# Patient Record
Sex: Female | Born: 1945 | Hispanic: Refuse to answer | Marital: Married
Health system: Midwestern US, Community
[De-identification: ages and names within clinical notes are randomized; demographics above are authoritative.]

## PROBLEM LIST (undated history)

## (undated) DIAGNOSIS — C801 Malignant (primary) neoplasm, unspecified: Secondary | ICD-10-CM

## (undated) DIAGNOSIS — F329 Major depressive disorder, single episode, unspecified: Secondary | ICD-10-CM

## (undated) DIAGNOSIS — E039 Hypothyroidism, unspecified: Secondary | ICD-10-CM

## (undated) DIAGNOSIS — I10 Essential (primary) hypertension: Secondary | ICD-10-CM

## (undated) DIAGNOSIS — R519 Headache, unspecified: Secondary | ICD-10-CM

## (undated) DIAGNOSIS — E785 Hyperlipidemia, unspecified: Secondary | ICD-10-CM

## (undated) DIAGNOSIS — M8580 Other specified disorders of bone density and structure, unspecified site: Secondary | ICD-10-CM

## (undated) DIAGNOSIS — R51 Headache: Secondary | ICD-10-CM

## (undated) DIAGNOSIS — N200 Calculus of kidney: Secondary | ICD-10-CM

## (undated) DIAGNOSIS — D249 Benign neoplasm of unspecified breast: Secondary | ICD-10-CM

## (undated) DIAGNOSIS — F419 Anxiety disorder, unspecified: Secondary | ICD-10-CM

## (undated) DIAGNOSIS — E119 Type 2 diabetes mellitus without complications: Secondary | ICD-10-CM

## (undated) HISTORY — DX: Benign neoplasm of unspecified breast: D24.9

## (undated) HISTORY — PX: POLYPECTOMY: SHX149

## (undated) HISTORY — DX: Hypothyroidism, unspecified: E03.9

## (undated) HISTORY — DX: Headache, unspecified: R51.9

## (undated) HISTORY — DX: Hyperlipidemia, unspecified: E78.5

## (undated) HISTORY — PX: TONSILLECTOMY: SUR1361

## (undated) HISTORY — DX: Essential (primary) hypertension: I10

## (undated) HISTORY — PX: BREAST SURGERY: SHX581

## (undated) HISTORY — DX: Malignant (primary) neoplasm, unspecified: C80.1

## (undated) HISTORY — DX: Calculus of kidney: N20.0

## (undated) HISTORY — PX: COLONOSCOPY: SHX174

## (undated) HISTORY — DX: Major depressive disorder, single episode, unspecified: F32.9

## (undated) HISTORY — DX: Headache: R51

## (undated) HISTORY — DX: Other specified disorders of bone density and structure, unspecified site: M85.80

---

## 1976-09-03 HISTORY — PX: OTHER SURGICAL HISTORY: SHX169

## 1986-09-03 HISTORY — PX: HYSTEROSCOPY: SHX211

## 1998-11-29 ENCOUNTER — Other Ambulatory Visit: Admission: RE | Admit: 1998-11-29 | Discharge: 1998-11-29 | Payer: Self-pay | Admitting: Obstetrics and Gynecology

## 2000-11-25 ENCOUNTER — Other Ambulatory Visit: Admission: RE | Admit: 2000-11-25 | Discharge: 2000-11-25 | Payer: Self-pay | Admitting: Obstetrics and Gynecology

## 2001-12-02 ENCOUNTER — Other Ambulatory Visit: Admission: RE | Admit: 2001-12-02 | Discharge: 2001-12-02 | Payer: Self-pay | Admitting: Obstetrics and Gynecology

## 2002-09-17 ENCOUNTER — Ambulatory Visit (HOSPITAL_BASED_OUTPATIENT_CLINIC_OR_DEPARTMENT_OTHER): Admission: RE | Admit: 2002-09-17 | Discharge: 2002-09-17 | Payer: Self-pay | Admitting: Obstetrics and Gynecology

## 2002-09-17 ENCOUNTER — Encounter (INDEPENDENT_AMBULATORY_CARE_PROVIDER_SITE_OTHER): Payer: Self-pay | Admitting: Specialist

## 2003-01-07 ENCOUNTER — Other Ambulatory Visit: Admission: RE | Admit: 2003-01-07 | Discharge: 2003-01-07 | Payer: Self-pay | Admitting: Obstetrics and Gynecology

## 2003-12-16 ENCOUNTER — Encounter: Payer: Self-pay | Admitting: Internal Medicine

## 2003-12-23 ENCOUNTER — Encounter: Payer: Self-pay | Admitting: Internal Medicine

## 2004-01-10 ENCOUNTER — Other Ambulatory Visit: Admission: RE | Admit: 2004-01-10 | Discharge: 2004-01-10 | Payer: Self-pay | Admitting: Obstetrics and Gynecology

## 2004-07-19 ENCOUNTER — Ambulatory Visit: Payer: Self-pay | Admitting: Internal Medicine

## 2004-11-16 ENCOUNTER — Ambulatory Visit: Payer: Self-pay | Admitting: Internal Medicine

## 2004-11-23 ENCOUNTER — Ambulatory Visit: Payer: Self-pay | Admitting: Internal Medicine

## 2004-12-29 ENCOUNTER — Ambulatory Visit: Payer: Self-pay | Admitting: Internal Medicine

## 2005-01-10 ENCOUNTER — Other Ambulatory Visit: Admission: RE | Admit: 2005-01-10 | Discharge: 2005-01-10 | Payer: Self-pay | Admitting: Obstetrics and Gynecology

## 2005-01-12 ENCOUNTER — Ambulatory Visit: Payer: Self-pay | Admitting: Internal Medicine

## 2005-03-14 ENCOUNTER — Ambulatory Visit: Payer: Self-pay | Admitting: Internal Medicine

## 2005-03-21 ENCOUNTER — Ambulatory Visit: Payer: Self-pay | Admitting: Internal Medicine

## 2005-06-15 ENCOUNTER — Ambulatory Visit: Payer: Self-pay | Admitting: Internal Medicine

## 2005-06-20 ENCOUNTER — Ambulatory Visit: Payer: Self-pay | Admitting: Internal Medicine

## 2005-12-12 ENCOUNTER — Ambulatory Visit: Payer: Self-pay | Admitting: Internal Medicine

## 2005-12-19 ENCOUNTER — Ambulatory Visit: Payer: Self-pay | Admitting: Internal Medicine

## 2006-01-09 ENCOUNTER — Ambulatory Visit: Payer: Self-pay | Admitting: Gastroenterology

## 2006-01-14 ENCOUNTER — Encounter: Payer: Self-pay | Admitting: Internal Medicine

## 2006-01-14 ENCOUNTER — Ambulatory Visit: Payer: Self-pay | Admitting: Gastroenterology

## 2006-01-14 ENCOUNTER — Encounter (INDEPENDENT_AMBULATORY_CARE_PROVIDER_SITE_OTHER): Payer: Self-pay | Admitting: Specialist

## 2006-02-14 ENCOUNTER — Other Ambulatory Visit: Admission: RE | Admit: 2006-02-14 | Discharge: 2006-02-14 | Payer: Self-pay | Admitting: Obstetrics and Gynecology

## 2006-06-17 ENCOUNTER — Ambulatory Visit: Payer: Self-pay | Admitting: Internal Medicine

## 2006-06-17 LAB — CONVERTED CEMR LAB
ALT: 42 units/L — ABNORMAL HIGH (ref 0–40)
AST: 31 units/L (ref 0–37)
Albumin: 4.5 g/dL (ref 3.5–5.2)
Alkaline Phosphatase: 84 units/L (ref 39–117)
Bilirubin, Direct: 0.1 mg/dL (ref 0.0–0.3)
Chol/HDL Ratio, serum: 3.6
Cholesterol: 297 mg/dL (ref 0–200)
HDL: 83.5 mg/dL (ref >39.0)
LDL DIRECT: 170 mg/dL
Total Bilirubin: 0.7 mg/dL (ref 0.3–1.2)
Total Protein: 7 g/dL (ref 6.0–8.3)
Triglyceride fasting, serum: 119 mg/dL (ref 0–149)
VLDL: 24 mg/dL (ref 0–40)

## 2007-04-15 ENCOUNTER — Other Ambulatory Visit: Admission: RE | Admit: 2007-04-15 | Discharge: 2007-04-15 | Payer: Self-pay | Admitting: Obstetrics and Gynecology

## 2007-04-16 ENCOUNTER — Ambulatory Visit: Payer: Self-pay | Admitting: Internal Medicine

## 2007-04-16 DIAGNOSIS — F329 Major depressive disorder, single episode, unspecified: Secondary | ICD-10-CM

## 2007-04-16 DIAGNOSIS — E039 Hypothyroidism, unspecified: Secondary | ICD-10-CM

## 2007-04-16 DIAGNOSIS — F3341 Major depressive disorder, recurrent, in partial remission: Secondary | ICD-10-CM | POA: Insufficient documentation

## 2007-04-16 DIAGNOSIS — F3289 Other specified depressive episodes: Secondary | ICD-10-CM

## 2007-04-16 HISTORY — DX: Other specified depressive episodes: F32.89

## 2007-04-16 HISTORY — DX: Hypothyroidism, unspecified: E03.9

## 2007-04-16 HISTORY — DX: Major depressive disorder, single episode, unspecified: F32.9

## 2007-07-09 ENCOUNTER — Ambulatory Visit: Payer: Self-pay | Admitting: Internal Medicine

## 2007-07-18 ENCOUNTER — Ambulatory Visit: Payer: Self-pay | Admitting: Internal Medicine

## 2007-07-18 LAB — CONVERTED CEMR LAB
ALT: 46 units/L — ABNORMAL HIGH (ref 0–35)
AST: 36 units/L (ref 0–37)
Albumin: 4.5 g/dL (ref 3.5–5.2)
Alkaline Phosphatase: 81 units/L (ref 39–117)
BUN: 12 mg/dL (ref 6–23)
Basophils Absolute: 0 10*3/uL (ref 0.0–0.1)
Basophils Relative: 0.7 % (ref 0.0–1.0)
Bilirubin Urine: NEGATIVE
Bilirubin, Direct: 0.1 mg/dL (ref 0.0–0.3)
CO2: 30 meq/L (ref 19–32)
Calcium: 10.4 mg/dL (ref 8.4–10.5)
Chloride: 104 meq/L (ref 96–112)
Cholesterol: 223 mg/dL (ref 0–200)
Creatinine, Ser: 0.8 mg/dL (ref 0.4–1.2)
Direct LDL: 122.4 mg/dL
Eosinophils Absolute: 0.3 10*3/uL (ref 0.0–0.6)
Eosinophils Relative: 6.5 % — ABNORMAL HIGH (ref 0.0–5.0)
GFR calc Af Amer: 94 mL/min
GFR calc non Af Amer: 78 mL/min
Glucose, Bld: 90 mg/dL (ref 70–99)
Glucose, Urine, Semiquant: NEGATIVE
HCT: 41.3 % (ref 36.0–46.0)
HDL: 81.8 mg/dL (ref >39.0)
Hemoglobin: 14.2 g/dL (ref 12.0–15.0)
Ketones, urine, test strip: NEGATIVE
Lymphocytes Relative: 19 % (ref 12.0–46.0)
MCHC: 34.4 g/dL (ref 30.0–36.0)
MCV: 92.1 fL (ref 78.0–100.0)
Monocytes Absolute: 0.3 10*3/uL (ref 0.2–0.7)
Monocytes Relative: 8 % (ref 3.0–11.0)
Neutro Abs: 2.8 10*3/uL (ref 1.4–7.7)
Neutrophils Relative %: 65.8 % (ref 43.0–77.0)
Nitrite: NEGATIVE
Platelets: 231 10*3/uL (ref 150–400)
Potassium: 4.5 meq/L (ref 3.5–5.1)
Protein, U semiquant: NEGATIVE
RBC: 4.49 M/uL (ref 3.87–5.11)
RDW: 13.1 % (ref 11.5–14.6)
Sodium: 141 meq/L (ref 135–145)
Specific Gravity, Urine: 1.015
TSH: 0.74 microintl units/mL (ref 0.35–5.50)
Total Bilirubin: 0.8 mg/dL (ref 0.3–1.2)
Total CHOL/HDL Ratio: 2.7
Total Protein: 6.6 g/dL (ref 6.0–8.3)
Triglycerides: 75 mg/dL (ref 0–149)
Urobilinogen, UA: 0.2
VLDL: 15 mg/dL (ref 0–40)
WBC Urine, dipstick: NEGATIVE
WBC: 4.2 10*3/uL — ABNORMAL LOW (ref 4.5–10.5)
pH: 8.5

## 2007-07-25 ENCOUNTER — Ambulatory Visit: Payer: Self-pay | Admitting: Internal Medicine

## 2007-08-11 ENCOUNTER — Telehealth: Payer: Self-pay | Admitting: Internal Medicine

## 2007-08-11 ENCOUNTER — Ambulatory Visit: Payer: Self-pay | Admitting: Internal Medicine

## 2007-08-19 ENCOUNTER — Telehealth (INDEPENDENT_AMBULATORY_CARE_PROVIDER_SITE_OTHER): Payer: Self-pay | Admitting: *Deleted

## 2007-09-11 ENCOUNTER — Ambulatory Visit: Payer: Self-pay | Admitting: Internal Medicine

## 2007-12-09 ENCOUNTER — Telehealth: Payer: Self-pay | Admitting: Internal Medicine

## 2008-04-16 ENCOUNTER — Other Ambulatory Visit: Admission: RE | Admit: 2008-04-16 | Discharge: 2008-04-16 | Payer: Self-pay | Admitting: Obstetrics and Gynecology

## 2008-06-04 ENCOUNTER — Encounter: Payer: Self-pay | Admitting: Internal Medicine

## 2008-06-24 ENCOUNTER — Telehealth: Payer: Self-pay | Admitting: Internal Medicine

## 2008-09-03 LAB — CONVERTED CEMR LAB: Pap Smear: NORMAL

## 2008-09-09 ENCOUNTER — Ambulatory Visit: Payer: Self-pay | Admitting: Internal Medicine

## 2008-09-09 LAB — CONVERTED CEMR LAB
AST: 34 units/L (ref 0–37)
Alkaline Phosphatase: 69 units/L (ref 39–117)
Basophils Absolute: 0 10*3/uL (ref 0.0–0.1)
Basophils Relative: 0.8 % (ref 0.0–3.0)
Blood in Urine, dipstick: NEGATIVE
Calcium: 10.4 mg/dL (ref 8.4–10.5)
Chloride: 105 meq/L (ref 96–112)
Cholesterol: 255 mg/dL (ref 0–200)
Creatinine, Ser: 0.8 mg/dL (ref 0.4–1.2)
Direct LDL: 140.1 mg/dL
Eosinophils Absolute: 0.2 10*3/uL (ref 0.0–0.7)
GFR calc Af Amer: 93 mL/min
Glucose, Bld: 99 mg/dL (ref 70–99)
MCV: 91.9 fL (ref 78.0–100.0)
Monocytes Relative: 7.1 % (ref 3.0–12.0)
Neutro Abs: 3.4 10*3/uL (ref 1.4–7.7)
Neutrophils Relative %: 69.9 % (ref 43.0–77.0)
Nitrite: NEGATIVE
Potassium: 4.5 meq/L (ref 3.5–5.1)
RBC: 4.41 M/uL (ref 3.87–5.11)
RDW: 12.9 % (ref 11.5–14.6)
Sodium: 143 meq/L (ref 135–145)
Total Protein: 7.4 g/dL (ref 6.0–8.3)
Triglycerides: 75 mg/dL (ref 0–149)
Urobilinogen, UA: 0.2
pH: 5.5

## 2008-10-08 ENCOUNTER — Ambulatory Visit: Payer: Self-pay | Admitting: Internal Medicine

## 2008-10-08 DIAGNOSIS — R51 Headache: Secondary | ICD-10-CM | POA: Insufficient documentation

## 2008-10-08 DIAGNOSIS — R519 Headache, unspecified: Secondary | ICD-10-CM | POA: Insufficient documentation

## 2008-10-08 LAB — HM COLONOSCOPY

## 2008-10-20 ENCOUNTER — Telehealth: Payer: Self-pay | Admitting: Internal Medicine

## 2008-10-21 ENCOUNTER — Ambulatory Visit: Payer: Self-pay | Admitting: Internal Medicine

## 2008-10-25 ENCOUNTER — Telehealth: Payer: Self-pay | Admitting: Internal Medicine

## 2008-12-07 ENCOUNTER — Encounter (INDEPENDENT_AMBULATORY_CARE_PROVIDER_SITE_OTHER): Payer: Self-pay | Admitting: *Deleted

## 2009-05-03 ENCOUNTER — Encounter: Payer: Self-pay | Admitting: Obstetrics and Gynecology

## 2009-05-03 ENCOUNTER — Ambulatory Visit: Payer: Self-pay | Admitting: Obstetrics and Gynecology

## 2009-05-03 ENCOUNTER — Other Ambulatory Visit: Admission: RE | Admit: 2009-05-03 | Discharge: 2009-05-03 | Payer: Self-pay | Admitting: Obstetrics and Gynecology

## 2009-06-28 ENCOUNTER — Ambulatory Visit: Payer: Self-pay | Admitting: Internal Medicine

## 2009-08-15 ENCOUNTER — Ambulatory Visit: Payer: Self-pay | Admitting: Internal Medicine

## 2009-11-05 ENCOUNTER — Ambulatory Visit: Payer: Self-pay | Admitting: Family Medicine

## 2009-11-07 ENCOUNTER — Ambulatory Visit: Payer: Self-pay | Admitting: Internal Medicine

## 2009-11-08 LAB — CONVERTED CEMR LAB
AST: 30 units/L (ref 0–37)
BUN: 18 mg/dL (ref 6–23)
Basophils Relative: 0.6 % (ref 0.0–3.0)
Bilirubin, Direct: 0 mg/dL (ref 0.0–0.3)
CO2: 30 meq/L (ref 19–32)
Creatinine, Ser: 0.9 mg/dL (ref 0.4–1.2)
Glucose, Bld: 106 mg/dL — ABNORMAL HIGH (ref 70–99)
HCT: 39.6 % (ref 36.0–46.0)
Lymphocytes Relative: 16 % (ref 12.0–46.0)
Lymphs Abs: 1 10*3/uL (ref 0.7–4.0)
MCHC: 33.1 g/dL (ref 30.0–36.0)
Monocytes Absolute: 0.5 10*3/uL (ref 0.1–1.0)
Monocytes Relative: 8.2 % (ref 3.0–12.0)
Neutrophils Relative %: 68.2 % (ref 43.0–77.0)
Platelets: 225 10*3/uL (ref 150.0–400.0)
Potassium: 4.2 meq/L (ref 3.5–5.1)
Total Bilirubin: 0.3 mg/dL (ref 0.3–1.2)
WBC: 6 10*3/uL (ref 4.5–10.5)

## 2009-12-21 ENCOUNTER — Ambulatory Visit: Payer: Self-pay | Admitting: Internal Medicine

## 2009-12-30 ENCOUNTER — Telehealth: Payer: Self-pay | Admitting: *Deleted

## 2010-01-03 ENCOUNTER — Ambulatory Visit: Payer: Self-pay | Admitting: Internal Medicine

## 2010-01-06 ENCOUNTER — Telehealth: Payer: Self-pay | Admitting: Internal Medicine

## 2010-01-20 ENCOUNTER — Telehealth: Payer: Self-pay | Admitting: Internal Medicine

## 2010-02-24 ENCOUNTER — Ambulatory Visit: Payer: Self-pay | Admitting: Internal Medicine

## 2010-03-09 ENCOUNTER — Encounter: Payer: Self-pay | Admitting: Internal Medicine

## 2010-03-10 ENCOUNTER — Telehealth: Payer: Self-pay | Admitting: Internal Medicine

## 2010-04-13 ENCOUNTER — Telehealth: Payer: Self-pay | Admitting: Internal Medicine

## 2010-05-23 ENCOUNTER — Ambulatory Visit: Payer: Self-pay | Admitting: Obstetrics and Gynecology

## 2010-05-23 ENCOUNTER — Other Ambulatory Visit: Admission: RE | Admit: 2010-05-23 | Discharge: 2010-05-23 | Payer: Self-pay | Admitting: Obstetrics and Gynecology

## 2010-07-12 ENCOUNTER — Ambulatory Visit: Payer: Self-pay | Admitting: Internal Medicine

## 2010-10-05 NOTE — Assessment & Plan Note (Signed)
Summary: FLU-SHOT/RCD   Nurse Visit   Allergies: 1)  ! Compazine  Immunizations Administered:  Pneumonia Vaccine:    Vaccine Type: Pneumovax    Site: right deltoid    Mfr: Merck    Dose: 0.5 ml    Route: IM    Given by: Alfred Levins, CMA    Exp. Date: 12/26/2011    Lot #: [id number]  Orders Added: 1)  Admin 1st Vaccine [90471] 2)  Flu Vaccine 70yrs + [[address]109] 3)  Pneumococcal Vaccine [60454] 4)  Admin of Any Addtl Vaccine [09811] Flu Vaccine Consent Questions     Do you have a history of severe allergic reactions to this vaccine? no    Any prior history of allergic reactions to egg and/or gelatin? no    Do you have a sensitivity to the preservative Thimersol? no    Do you have a past history of Guillan-Barre Syndrome? no    Do you currently have an acute febrile illness? no    Have you ever had a severe reaction to latex? no    Vaccine information given and explained to patient? yes    Are you currently pregnant? no    Lot Number:[id number]   Exp Date:03/03/2011   Site Given  Left Deltoid IM-CCC]  .[email or web address]

## 2010-10-05 NOTE — Progress Notes (Signed)
Summary: temazepam  Phone Note Refill Request Message from:  Fax from Pharmacy on March 10, 2010 8:08 AM  Refills Requested: Medication #1:  RESTORIL 15 MG CAPS one by mouth q hs   Notes: [facility].    Initial call taken by: Debbra Riding,  March 10, 2010 8:08 AM    Prescriptions: RESTORIL 15 MG CAPS (TEMAZEPAM) one by mouth q hs  #30 x 2   Entered by:   Gladis Riffle, RN   Authorized by:   Birdie Sons MD   Signed by:   Gladis Riffle, RN on 03/10/2010   Method used:   Printed then faxed to ...       [facility]* (retail)       [address]       [place], [place]  [address]       Ph: [phone]       Fax: [phone]   RxID:   [id number]

## 2010-10-05 NOTE — Consult Note (Signed)
Summary: [facility]  [facility]   Imported By: Maryln Gottron 03/14/2010 13:41:28  _____________________________________________________________________  External Attachment:    Type:   Image     Comment:   External Document

## 2010-10-05 NOTE — Assessment & Plan Note (Signed)
Summary: PNEUMONIA/DLO   Vital Signs:  Patient profile:   65 year old female Weight:      153 pounds Temp:     98.7 degrees F oral Pulse rate:   72 / minute Pulse rhythm:   regular BP sitting:   140 / 84  (left arm) Cuff size:   regular  Vitals Entered By: Lowella Petties CMA (November 05, 2009 9:23 AM) CC: Cough and congestion, recently had pneumonia   History of Present Illness: 65 year old:  Patient has some laryngitis. Last night, hears some tihngs in her chest. Feels like she is a little short of breath.   Never had some     Acute Visit History:      The patient complains of cough and musculoskeletal symptoms.  These symptoms began 2 days ago.  She denies chest pain, diarrhea, earache, fever, headache, nasal discharge, nausea, sinus problems, and sore throat.        The patient notes shortness of breath.  The cough interferes with her sleep.  The character of the cough is described as productive.  There is no history of wheezing, respiratory retractions, tachypnea, cyanosis, or interference with oral intake associated with her cough.        Urine output has been normal.  She is tolerating clear liquids.        Allergies: 1)  ! Compazine  Past History:  Past medical, surgical, family and social histories (including risk factors) reviewed, and no changes noted (except as noted below).  Past Medical History: Reviewed history from 04/16/2007 and no changes required. Depression Hypothyroidism  Past Surgical History: Reviewed history from 04/16/2007 and no changes required. melanoma-1978  Family History: Reviewed history from 10/08/2008 and no changes required. father macular degeneration father CABG 30s mother- dementia mother MIx2---in her 71s  Social History: Reviewed history from 04/16/2007 and no changes required. Occupation:-relator Married Never Smoked Regular exercise-no  Review of Systems       REVIEW OF SYSTEMS GEN: Acute illness details  above. CV: No chest pain GI: No noted N or V Otherwise, pertinent positives and negatives are noted in the HPI.   Physical Exam  Additional Exam:  GEN: A and O x 3. WDWN. NAD.    ENT: Nose clear, ext NML.  No LAD.  No JVD.  TM's clear. Oropharynx clear.  PULM: Normal WOB, no distress. CRACKLES IN B LUNG BASES, R > L. NO WHEEZES, NO RHONCHI CV: RRR, no M/G/R, No rubs, No JVD.   ABD: S, NT, ND, + BS. No rebound. No guarding. No HSM.   EXT: warm and well-perfused, No c/c/e. PSYCH: Pleasant and conversant.    Impression & Recommendations:  Problem # 1:  PNEUMONIA DUE TO OTHER SPECIFIED ORGANISM (ICD-483.8) Assessment New Exam c/w PNA - crackles lung bases stable for outpatient management  supportive care reviewed  The following medications were removed from the medication list:    Doxycycline Hyclate 100 Mg Caps (Doxycycline hyclate) ..... One twice daily Her updated medication list for this problem includes:    Doxycycline Hyclate 100 Mg Caps (Doxycycline hyclate) .Marland Kitchen... Take 1 tab twice a day  Complete Medication List: 1)  Restoril 15 Mg Caps (Temazepam) .... One by mouth q hs 2)  Synthroid 125 Mcg Tabs (Levothyroxine sodium) .... One by mouth daily 3)  Crestor 10 Mg Tabs (Rosuvastatin calcium) .... Take 1 tablet by mouth once a day 4)  Caltrate 600+d 600-400 Mg-unit Tabs (Calcium carbonate-vitamin d) .... 2 once daily  5)  Zoloft 100 Mg Tabs (Sertraline hcl) .Marland KitchenMarland KitchenMarland Kitchen 150 mg. q day 6)  Alprazolam 0.5 Mg Tabs (Alprazolam) .... One half to one pill p.o. b.i.d. p.r.n. 7)  Doxycycline Hyclate 100 Mg Caps (Doxycycline hyclate) .... Take 1 tab twice a day Prescriptions: DOXYCYCLINE HYCLATE 100 MG CAPS (DOXYCYCLINE HYCLATE) Take 1 tab twice a day  #20 x 0   Entered and Authorized by:   Hannah Beat MD   Signed by:   Hannah Beat MD on 11/05/2009   Method used:   Electronically to        [facility]* (retail)       [address]       [place], [place]  [address]        Ph: [phone]       Fax: [phone]   RxID:   [id number]   Prior Medications: RESTORIL 15 MG CAPS (TEMAZEPAM) one by mouth q hs SYNTHROID 125 MCG  TABS (LEVOTHYROXINE SODIUM) one by mouth daily CRESTOR 10 MG  TABS (ROSUVASTATIN CALCIUM) Take 1 tablet by mouth once a day CALTRATE 600+D 600-400 MG-UNIT  TABS (CALCIUM CARBONATE-VITAMIN D) 2 once daily ZOLOFT 100 MG  TABS (SERTRALINE HCL) 150 mg. q day ALPRAZOLAM 0.5 MG  TABS (ALPRAZOLAM) one half to one pill p.o. b.i.d. p.r.n. Current Allergies: ! COMPAZINE

## 2010-10-05 NOTE — Progress Notes (Signed)
Summary: Pt still coughing continuously. Hydromet not working at [facility] Note Call from Patient Call back at [facility] [phone] Call back at [phone] cell   Caller: Patient Summary of Call: Pt called and said hydromet is not working at all. Pt is not sleeping. Pt is req still coughing continuously and is req a diff med called or another script of hydromet. Please call in to [facility] [phone] Initial call taken by: Lucy Antigua,  December 30, 2009 9:11 AM  Follow-up for Phone Call        see meds: tessalon  mucinex d/c hydromet---call monday if notimproving Follow-up by: Birdie Sons MD,  December 30, 2009 1:02 PM  Additional Follow-up for Phone Call Additional follow up Details #1::        Pt called to check on status of meds. Please make sure that this is done before end of the day.  Additional Follow-up by: Lucy Antigua,  December 30, 2009 1:39 PM    Additional Follow-up for Phone Call Additional follow up Details #2::    left message on machine for patient  Follow-up by: Kern Reap CMA Duncan Dull),  December 30, 2009 3:16 PM  New/Updated Medications: TESSALON 200 MG CAPS (BENZONATATE) Take one capsule by mouth three times a day as needed for cough MUCINEX DM MAXIMUM STRENGTH 60-1200 MG XR12H-TAB (DEXTROMETHORPHAN-GUAIFENESIN) Take 1 tablet by mouth two times a day Prescriptions: MUCINEX DM MAXIMUM STRENGTH 60-1200 MG XR12H-TAB (DEXTROMETHORPHAN-GUAIFENESIN) Take 1 tablet by mouth two times a day  #1 packet x PRN   Entered and Authorized by:   Birdie Sons MD   Signed by:   Birdie Sons MD on 12/30/2009   Method used:   Electronically to        [facility]* (retail)       [address]       [place], [place]  [address]       Ph: [phone]       Fax: [phone]   RxID:   [id number] TESSALON 200 MG CAPS (BENZONATATE) Take one capsule by mouth three times a day as needed for cough  #30 x 0   Entered and Authorized by:   Birdie Sons MD   Signed by:   Birdie Sons MD on 12/30/2009   Method used:   Electronically to        [facility]* (retail)       [address]       [place], [place]  [address]       Ph: [phone]       Fax: [phone]   RxID:   [id number]

## 2010-10-05 NOTE — Assessment & Plan Note (Signed)
Summary: cough/njr   Vital Signs:  Patient profile:   65 year old female Temp:     98 degrees F oral Pulse rate:   80 / minute Pulse rhythm:   regular Resp:     12 per minute BP sitting:   114 / 76  (left arm) Cuff size:   regular  Vitals Entered By: Gladis Riffle, RN (November 07, 2009 1:54 PM) CC: seen in Sat clininc and told has pneumonia--cough worse now and gets uncontrolled "fits of cough"--one episode of blood tinged mucous following cough Is Patient Diabetic? No   CC:  seen in Sat clininc and told has pneumonia--cough worse now and gets uncontrolled "fits of cough"--one episode of blood tinged mucous following cough.  History of Present Illness: she describes deep paroxysms of cough no fever or chills cough is productive of yellow sputum reviewed notes from dr copland and Amador Cunas no sob, no wheeze she admits to a great deal of fatigue for several days  IN ADDITION--she complains of diffuse pains for months. Can have periods of sharp pains---variable areas     Preventive Screening-Counseling & Management  Alcohol-Tobacco     Smoking Status: never  Current Medications (verified): 1)  Restoril 15 Mg Caps (Temazepam) .... One By Mouth Q Hs 2)  Synthroid 125 Mcg  Tabs (Levothyroxine Sodium) .... One By Mouth Daily 3)  Crestor 10 Mg  Tabs (Rosuvastatin Calcium) .... Take 1 Tablet By Mouth Once A Day 4)  Caltrate 600+d 600-400 Mg-Unit  Tabs (Calcium Carbonate-Vitamin D) .... 2 Once Daily 5)  Zoloft 100 Mg  Tabs (Sertraline Hcl) .Marland KitchenMarland KitchenMarland Kitchen 150 Mg. Q Day 6)  Alprazolam 0.5 Mg  Tabs (Alprazolam) .... One Half To One Pill P.o. B.i.d. P.r.n. 7)  Doxycycline Hyclate 100 Mg Caps (Doxycycline Hyclate) .... Take 1 Tab Twice A Day  Allergies: 1)  ! Compazine  Physical Exam  General:  Well-developed,well-nourished,in no acute distress; alert,appropriate and cooperative throughout examination Head:  normocephalic and atraumatic.   Eyes:  pupils equal and pupils round.   Ears:  R  ear normal and L ear normal.   Neck:  No deformities, masses, or tenderness noted. Lungs:  normal respiratory effort, no intercostal retractions, no accessory muscle use, normal breath sounds, no dullness, and no fremitus.   Heart:  normal rate and regular rhythm.   Msk:  No deformity or scoliosis noted of thoracic or lumbar spine.   Neurologic:  cranial nerves II-XII intact and gait normal.     Impression & Recommendations:  Problem # 1:  PNEUMONIA DUE TO OTHER SPECIFIED ORGANISM (ICD-483.8) see previous note clinical exam today is normal given that she is feeling worse I'll check CXR and labs consider ABX change after CXR Her updated medication list for this problem includes:    Doxycycline Hyclate 100 Mg Caps (Doxycycline hyclate) .Marland Kitchen... Take 1 tab twice a day  Orders: Sedimentation Rate, non-automated ([address]109) T-2 View CXR (71020TC)  Problem # 2:  FATIGUE (ICD-780.79) check labs today (sxs ongoing longer than acute illness) Orders: TLB-BMP (Basic Metabolic Panel-BMET) (80048-METABOL) TLB-Hepatic/Liver Function Pnl (80076-HEPATIC) TLB-CBC Platelet - w/Differential (85025-CBCD) TLB-TSH (Thyroid Stimulating Hormone) (84443-TSH) Sedimentation Rate, non-automated (60454) T-2 View CXR (71020TC)  Complete Medication List: 1)  Restoril 15 Mg Caps (Temazepam) .... One by mouth q hs 2)  Synthroid 125 Mcg Tabs (Levothyroxine sodium) .... One by mouth daily 3)  Crestor 10 Mg Tabs (Rosuvastatin calcium) .... Take 1 tablet by mouth once a day 4)  Caltrate 600+d 600-400 Mg-unit  Tabs (Calcium carbonate-vitamin d) .... 2 once daily 5)  Zoloft 100 Mg Tabs (Sertraline hcl) .Marland KitchenMarland KitchenMarland Kitchen 150 mg. q day 6)  Alprazolam 0.5 Mg Tabs (Alprazolam) .... One half to one pill p.o. b.i.d. p.r.n. 7)  Doxycycline Hyclate 100 Mg Caps (Doxycycline hyclate) .... Take 1 tab twice a day 8)  Hydromet 5-1.5 Mg/41ml Syrp (Hydrocodone-homatropine) .Marland Kitchen.. 1 tsp three times a day as needed Prescriptions: HYDROMET 5-1.5 MG/5ML  SYRP (HYDROCODONE-HOMATROPINE) 1 tsp three times a day as needed  #240 cc x 0   Entered and Authorized by:   Birdie Sons MD   Signed by:   Birdie Sons MD on 11/07/2009   Method used:   Print then Give to Patient   RxID:   [id number]   Appended Document: cough/njr  Laboratory Results   Blood Tests     SED rate: 14 mm/hr  Comments: Rita Ohara  November 07, 2009 3:57 PM

## 2010-10-05 NOTE — Assessment & Plan Note (Signed)
Summary: COUGH, CONGESTION // RS   Vital Signs:  Patient profile:   65 year old female Temp:     97.8 degrees F oral Pulse rate:   80 / minute Pulse rhythm:   regular Resp:     12 per minute BP sitting:   128 / 70  (left arm) Cuff size:   regular  Vitals Entered By: Gladis Riffle, RN (December 21, 2009 12:51 PM) CC: c/o cough and congestion--states cough since Thanksgiving, but is a different cough now--keeps her up at night also Is Patient Diabetic? No   CC:  c/o cough and congestion--states cough since Thanksgiving and but is a different cough now--keeps her up at night also.  History of Present Illness: states she has not felt well since november she compalins of aches and pains---diffusely,  in addition she complains of a "loose" cough---no fever, chills sweats,   she also complains of being "stressed out" , admits to depression. she has tried xanax---causes headache.   All other systems reviewed and were negative   Preventive Screening-Counseling & Management  Alcohol-Tobacco     Smoking Status: never  Current Medications (verified): 1)  Restoril 15 Mg Caps (Temazepam) .... One By Mouth Q Hs 2)  Synthroid 125 Mcg  Tabs (Levothyroxine Sodium) .... One By Mouth Daily 3)  Crestor 10 Mg  Tabs (Rosuvastatin Calcium) .... Take 1 Tablet By Mouth Once A Day 4)  Caltrate 600+d 600-400 Mg-Unit  Tabs (Calcium Carbonate-Vitamin D) .... 2 Once Daily 5)  Zoloft 100 Mg  Tabs (Sertraline Hcl) .Marland KitchenMarland KitchenMarland Kitchen 150 Mg. Q Day 6)  Alprazolam 0.5 Mg  Tabs (Alprazolam) .... One Half To One Pill P.o. B.i.d. P.r.n. 7)  Hydromet 5-1.5 Mg/43ml Syrp (Hydrocodone-Homatropine) .Marland Kitchen.. 1 Tsp Three Times A Day As Needed  Allergies: 1)  ! 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   Eyes:  pupils equal and pupils round.   Ears:  R ear normal and L ear normal.   Nose:  no external deformity and no external erythema.   Neck:  No deformities, masses, or tenderness noted. Lungs:  normal respiratory effort, no intercostal retractions, no accessory muscle use, normal breath sounds, no dullness, and no fremitus.   Heart:  normal rate and regular rhythm.     Impression & Recommendations:  Problem # 1:  FATIGUE (ICD-780.79) with myalgias check ESR  Problem # 2:  DEPRESSION (ICD-311) I suspect about symptoms related to depression.  May need to change medications.  We'll wait for sedimentation rate. Her updated medication list for this problem includes:    Zoloft 100 Mg Tabs (Sertraline hcl) .Marland KitchenMarland KitchenMarland KitchenMarland Kitchen 150 mg. q day    Alprazolam 0.5 Mg Tabs (Alprazolam) ..... One half to one pill p.o. b.i.d. p.r.n.  Problem # 3:  URI (ICD-465.9) no evidence of bacterial infection. call for any concerns, increased sxs, fever, persistence of sxs, wheeze, SOB.   Orders: Venipuncture ([address]) Sedimentation Rate, non-automated (60[address])  Complete Medication List: 1)  Restoril 15 Mg Caps (Temazepam) .... One by mouth q hs 2)  Synthroid  125 Mcg Tabs (Levothyroxine sodium) .... One by mouth daily 3)  Crestor 10 Mg Tabs (Rosuvastatin calcium) .... Take 1 tablet by mouth once a day 4)  Caltrate 600+d 600-400 Mg-unit Tabs (Calcium carbonate-vitamin d) .... 2 once daily 5)  Zoloft 100 Mg Tabs (Sertraline hcl) .Marland KitchenMarland KitchenMarland Kitchen 150 mg. q day 6)  Alprazolam 0.5 Mg Tabs (Alprazolam) .... One half to one pill  p.o. b.i.d. p.r.n. 7)  Hydromet 5-1.5 Mg/9ml Syrp (Hydrocodone-homatropine) .Marland Kitchen.. 1 tsp three times a day as needed  Laboratory Results   Blood Tests     SED rate: 20 mm/hr  Comments: Rita Ohara  December 21, 2009 1:51 PM      Appended Document: COUGH, CONGESTION // RS called patient  suspect depression is the culprit zoloft 50 mg for one week and then stop start wellbutrin xr 150 mg once daily for one week and then two times a day  pt informed  Appended Document: COUGH, CONGESTION // RS Medications Added BUDEPRION SR 150 MG TB12 (BUPROPION HCL) 1 po daily for 1 week and then 1 po twice daily. Decrease sertraline to 1/2 tablet for one week then stop          Clinical Lists Changes  Medications: Removed medication of ZOLOFT 100 MG  TABS (SERTRALINE HCL) 150 mg. q day Added new medication of BUDEPRION SR 150 MG TB12 (BUPROPION HCL) 1 po daily for 1 week and then 1 po twice daily. Decrease sertraline to 1/2 tablet for one week then stop - Signed Rx of BUDEPRION SR 150 MG TB12 (BUPROPION HCL) 1 po daily for 1 week and then 1 po twice daily. Decrease sertraline to 1/2 tablet for one week then stop;  #60 x 3;  Signed;  Entered by: Birdie Sons MD;  Authorized by: Birdie Sons MD;  Method used: Electronically to [facility]*, [address], [place], [place]  [id number], Ph: [phone], Fax: [phone]    Prescriptions: BUDEPRION SR 150 MG TB12 (BUPROPION HCL) 1 po daily for 1 week and then 1 po twice daily. Decrease sertraline to 1/2 tablet for one week then stop  #60 x 3   Entered and Authorized by:   Birdie Sons MD   Signed by:   Birdie Sons MD on 12/26/2009   Method used:   Electronically to        [facility]* (retail)       [address]       [place], [place]  [address]       Ph: [phone]       Fax: [phone]   RxID:   [id number]

## 2010-10-05 NOTE — Progress Notes (Signed)
Summary: stop Wellbutrin  Phone Note Call from Patient   Caller: Patient Call For: Birdie Sons MD Summary of Call: Pt does not like the Wellbutrin....wants to stop it, and go back on Zoloft.  How can she do this? [phone] Initial call taken by: Lynann Beaver CMA,  Jan 20, 2010 10:52 AM  Follow-up for Phone Call        i would suggest this: stop wellbutrin samples of cymbalta 30 mg by mouth once daily for 7 days then 60mg  by mouth once daily  (give samples of both---) see me -6 weeks Follow-up by: Birdie Sons MD,  Jan 20, 2010 11:43 AM    New/Updated Medications: CYMBALTA 60 MG CPEP (DULOXETINE HCL) one by mouth daily.  Start with 30 mg one by mouth daily x one week, then increase to 60 mg q day  Samples given and discussed with Pt.

## 2010-10-05 NOTE — Progress Notes (Signed)
Summary: please advise  Phone Note Call from Patient Call back at [facility] Phone [phone] Call back at [phone]   Summary of Call: coughing has decreased from 8x per night to 3x per night. Should she continue with Muccinex? Was told to call back today. Initial call taken by: Warnell Forester,  Jan 06, 2010 10:56 AM  Follow-up for Phone Call        yes for 10 more days Follow-up by: Birdie Sons MD,  Jan 06, 2010 12:53 PM  Additional Follow-up for Phone Call Additional follow up Details #1::        Pt. notified. Additional Follow-up by: Lynann Beaver CMA,  Jan 06, 2010 12:55 PM

## 2010-10-05 NOTE — Assessment & Plan Note (Signed)
Summary: MEDICATION CONCERNS / COUGH // RS   Vital Signs:  Patient profile:   65 year old female Temp:     97.7 degrees F oral Pulse rate:   72 / minute Pulse rhythm:   regular Resp:     12 per minute BP sitting:   138 / 66  (left arm) Cuff size:   regular  Vitals Entered By: Gladis Riffle, RN (February 24, 2010 10:33 AM) CC: discuss medications--continues cough--also unable to take cymbalta due to difficulty sleeping, back pain, and cries all the time Is Patient Diabetic? No Comments needle biopsy left breast 3 weeks ago, fibroadenoma   CC:  discuss medications--continues cough--also unable to take cymbalta due to difficulty sleeping, back pain, and and cries all the time.  History of Present Illness: cannot tolerate cymbalta---worsening depression (not suicidal), cries a lot can't sleep causes back pain depression: --has had better luck with sertraline  Continued cough: coughing paroxysms---continues  Preventive Screening-Counseling & Management  Alcohol-Tobacco     Smoking Status: never  Current Problems (verified): 1)  Preventive Health Care  (ICD-V70.0) 2)  Headache  (ICD-784.0) 3)  Hypothyroidism  (ICD-244.9) 4)  Depression  (ICD-311)  Current Medications (verified): 1)  Restoril 15 Mg Caps (Temazepam) .... One By Mouth Q Hs 2)  Synthroid 125 Mcg  Tabs (Levothyroxine Sodium) .... One By Mouth Daily 3)  Crestor 10 Mg  Tabs (Rosuvastatin Calcium) .... Take 1 Tablet By Mouth Once A Day 4)  Caltrate 600+d 600-400 Mg-Unit  Tabs (Calcium Carbonate-Vitamin D) .... 2 Once Daily 5)  Alprazolam 0.5 Mg  Tabs (Alprazolam) .... One Half To One Pill P.o. B.i.d. P.r.n. 6)  Tessalon 200 Mg Caps (Benzonatate) .... Take One Capsule By Mouth Three Times A Day As Needed For Cough 7)  Mucinex Dm Maximum Strength 60-1200 Mg Xr12h-Tab (Dextromethorphan-Guaifenesin) .... Take 1 Tablet By Mouth Two Times A Day  Allergies: 1)  ! 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   Head:  normocephalic and atraumatic.   Eyes:  pupils equal and pupils round.   Neck:  No deformities, masses, or tenderness noted. Chest Wall:  No deformities, masses, or tenderness noted. Lungs:  normal respiratory effort, no intercostal retractions, no dullness, no fremitus, no crackles, and no wheezes.   Psych:  crying at times   Impression & Recommendations:  Problem # 1:  DEPRESSION (ICD-311)  discussed---change meds side effects discussed  Problem # 2:  COUGH (ICD-786.2)  reviewed cxr given chronic nature she needs further investigation PPI ENT eval.  side effects of PPI discussed  Orders: ENT Referral (ENT) Pulmonary Referral (Pulmonary)  Complete Medication List: 1)  Restoril 15 Mg Caps (Temazepam) .... One by mouth q hs 2)  Synthroid 125 Mcg Tabs (Levothyroxine sodium) .... One by mouth daily 3)  Crestor 10 Mg Tabs (Rosuvastatin calcium) .... Take 1 tablet by mouth once a day 4)  Caltrate 600+d 600-400 Mg-unit Tabs (Calcium carbonate-vitamin d) .... 2 once daily 5)  Alprazolam 0.5 Mg Tabs (Alprazolam) .... One half to one pill p.o. b.i.d. p.r.n. 6)  Tessalon 200 Mg Caps (Benzonatate) .... Take one capsule by mouth three times a day as needed for cough 7)  Sertraline Hcl 100 Mg Tabs (Sertraline hcl) .Marland Kitchen.. 1 1/2 by mouth once daily 8)  Omeprazole 20 Mg Cpdr (Omeprazole) .... One by mouth daily  Patient Instructions: 1)  sertraline 1/2 by mouth  once daily for one week then 1 by mouth once daily for one week and then 1 1/2 by mouth once daily  2)  Please schedule a follow-up appointment in 1 month. Prescriptions: OMEPRAZOLE 20 MG CPDR (OMEPRAZOLE) one by mouth daily  #30 x 3   Entered and Authorized by:   Birdie Sons MD   Signed by:   Birdie Sons MD on 02/24/2010   Method used:   Electronically to        [facility]* (retail)       [address]       [place], [place]  [id number]       Ph: [phone]       Fax: [phone]   RxID:   [id number] SERTRALINE HCL 100 MG TABS (SERTRALINE HCL) 1 1/2 by mouth once daily  #135 x 3   Entered and Authorized by:   Birdie Sons MD   Signed by:   Birdie Sons MD on 02/24/2010   Method used:   Electronically to        [facility]* (retail)       [address] [place] [address]       [place], [place]  [address]       Ph: [phone]       Fax: [phone]   RxID:   [id number]

## 2010-10-05 NOTE — Progress Notes (Signed)
Summary: please return call  Phone Note Call from Patient Call back at [phone]   Caller: Patient---live call Summary of Call: Having a difficulty time and would like Valium called in to [facility]. Wants the nurse to call her back. she is at work. Initial call taken by: Warnell Forester,  April 13, 2010 11:14 AM  Follow-up for Phone Call        Family causing stress with father in rehab following fall and has no siblings.  Would like something to calm her to [facility].  (Please send back to Debby as I am leaving early.) Follow-up by: Gladis Riffle, RN,  April 13, 2010 11:41 AM  Additional Follow-up for Phone Call Additional follow up Details #1::        stop alprazolam trial lorazepam 0.5 mg by mouth two times a day as needed. not to exceed 8 weekly  #32/1 Additional Follow-up by: Birdie Sons MD,  April 13, 2010 12:39 PM    New/Updated Medications: LORAZEPAM 0.5 MG TABS (LORAZEPAM) one by mouth bid Prescriptions: LORAZEPAM 0.5 MG TABS (LORAZEPAM) one by mouth bid  #32 x 1   Entered by:   Lynann Beaver CMA   Authorized by:   Birdie Sons MD   Signed by:   Lynann Beaver CMA on 04/13/2010   Method used:   Telephoned to ...       [facility]* (retail)       [address]       [place], [place]  [id number]       Ph: [phone]       Fax: [phone]   RxID:   [id number]  Pt notified.

## 2010-10-05 NOTE — Assessment & Plan Note (Signed)
Summary: cough is worse/dm   Vital Signs:  Patient profile:   65 year old female Temp:     98.7 degrees F oral Pulse rate:   76 / minute Pulse rhythm:   regular Resp:     12 per minute BP sitting:   138 / 82  (left arm) Cuff size:   regular  Vitals Entered By: Gladis Riffle, RN (Jan 03, 2010 11:52 AM) CC: c/o continues cough, keeps her up at night Is Patient Diabetic? No   CC:  c/o continues cough and keeps her up at night.  History of Present Illness: Cough is much better She still has significant nocturnal cough "I wake up coughing"  no fever or chills no sweats cough is productive of white mucous no SOB  All other systems reviewed and were negative   Preventive Screening-Counseling & Management  Alcohol-Tobacco     Smoking Status: never  Current Medications (verified): 1)  Restoril 15 Mg Caps (Temazepam) .... One By Mouth Q Hs 2)  Synthroid 125 Mcg  Tabs (Levothyroxine Sodium) .... One By Mouth Daily 3)  Crestor 10 Mg  Tabs (Rosuvastatin Calcium) .... Take 1 Tablet By Mouth Once A Day 4)  Caltrate 600+d 600-400 Mg-Unit  Tabs (Calcium Carbonate-Vitamin D) .... 2 Once Daily 5)  Alprazolam 0.5 Mg  Tabs (Alprazolam) .... One Half To One Pill P.o. B.i.d. P.r.n. 6)  Tessalon 200 Mg Caps (Benzonatate) .... Take One Capsule By Mouth Three Times A Day As Needed For Cough 7)  Budeprion Sr 150 Mg Tb12 (Bupropion Hcl) .Marland Kitchen.. 1 Po Daily For 1 Week and Then 1 Po Twice Daily. Decrease Sertraline To 1/2 Tablet For One Week Then Stop 8)  Mucinex Dm Maximum Strength 60-1200 Mg Xr12h-Tab (Dextromethorphan-Guaifenesin) .... Take 1 Tablet By Mouth Two Times A Day  Allergies: 1)  ! Compazine  Past History:  Past Medical History: Last updated: 04/16/2007 Depression Hypothyroidism  Past Surgical History: Last updated: 04/16/2007 melanoma-1978  Family History: Last updated: 10/08/2008 father macular degeneration father CABG 9s mother- dementia mother MIx2---in her 66s  Social  History: Last updated: 04/16/2007 Occupation:-relator Married Never Smoked Regular exercise-no  Risk Factors: Exercise: no (04/16/2007)  Risk Factors: Smoking Status: never (01/03/2010)  Physical Exam  General:  alert and well-developed.   Head:  normocephalic and atraumatic.   Eyes:  pupils equal and pupils round.   Neck:  No deformities, masses, or tenderness noted. Chest Wall:  No deformities, masses, or tenderness noted. Lungs:  normal respiratory effort, no intercostal retractions, no dullness, no fremitus, no crackles, and no wheezes.   Heart:  normal rate and regular rhythm.   Abdomen:  soft and non-tender.   Msk:  No deformity or scoliosis noted of thoracic or lumbar spine.   Neurologic:  cranial nerves II-XII intact and gait normal.     Impression & Recommendations:  Problem # 1:  URI (ICD-465.9) discussed trial prednisone reviewed records---note similar sxs 10/2008 (prednisone responsive) Her updated medication list for this problem includes:    Tessalon 200 Mg Caps (Benzonatate) .Marland Kitchen... Take one capsule by mouth three times a day as needed for cough    Mucinex Dm Maximum Strength 60-1200 Mg Xr12h-tab (Dextromethorphan-guaifenesin) .Marland Kitchen... Take 1 tablet by mouth two times a day  Complete Medication List: 1)  Restoril 15 Mg Caps (Temazepam) .... One by mouth q hs 2)  Synthroid 125 Mcg Tabs (Levothyroxine sodium) .... One by mouth daily 3)  Crestor 10 Mg Tabs (Rosuvastatin calcium) .... Take 1 tablet  by mouth once a day 4)  Caltrate 600+d 600-400 Mg-unit Tabs (Calcium carbonate-vitamin d) .... 2 once daily 5)  Alprazolam 0.5 Mg Tabs (Alprazolam) .... One half to one pill p.o. b.i.d. p.r.n. 6)  Tessalon 200 Mg Caps (Benzonatate) .... Take one capsule by mouth three times a day as needed for cough 7)  Budeprion Sr 150 Mg Tb12 (Bupropion hcl) .Marland Kitchen.. 1 po daily for 1 week and then 1 po twice daily. decrease sertraline to 1/2 tablet for one week then stop 8)  Mucinex Dm  Maximum Strength 60-1200 Mg Xr12h-tab (Dextromethorphan-guaifenesin) .... Take 1 tablet by mouth two times a day 9)  Prednisone 20 Mg Tabs (Prednisone) .... 3 po qd for 2 days, then 2 po qd for 2 days, then 1 po qd for 2 days, then 1/2 po qd for 2 days Prescriptions: PREDNISONE 20 MG TABS (PREDNISONE) 3 po qd for 2 days, then 2 po qd for 2 days, then 1 po qd for 2 days, then 1/2 po qd for 2 days  #15 x 0   Entered and Authorized by:   Birdie Sons MD   Signed by:   Birdie Sons MD on 01/03/2010   Method used:   Electronically to        [facility]* (retail)       [address]       [place], [place]  [id number]       Ph: [phone]       Fax: [phone]   RxID:   [id number]

## 2010-10-17 ENCOUNTER — Other Ambulatory Visit: Payer: Self-pay | Admitting: Internal Medicine

## 2010-11-01 ENCOUNTER — Ambulatory Visit (INDEPENDENT_AMBULATORY_CARE_PROVIDER_SITE_OTHER): Payer: PRIVATE HEALTH INSURANCE | Admitting: Family Medicine

## 2010-11-01 ENCOUNTER — Encounter: Payer: Self-pay | Admitting: Family Medicine

## 2010-11-01 VITALS — BP 140/90 | Temp 98.1°F | Ht 62.5 in | Wt 152.0 lb

## 2010-11-01 DIAGNOSIS — H1030 Unspecified acute conjunctivitis, unspecified eye: Secondary | ICD-10-CM

## 2010-11-01 MED ORDER — POLYMYXIN B-TRIMETHOPRIM 10000-0.1 UNIT/ML-% OP SOLN: 2.0000 [drp] | OPHTHALMIC | Status: AC

## 2010-11-01 NOTE — Patient Instructions (Signed)
Pink Eye (Conjunctivitis, Bacterial) Conjunctivitis is an irritation (inflammation) of the clear membrane that covers the white part of the eye (conjunctiva). The irritation can also happen on the underside of the eyelids. Conjunctivitis makes the eye red or pink in color. This is what is commonly known as pink eye.  CAUSES   Infection from a germ (bacteria) on the surface of the eye.   Infection from the irritation or injury of nearby tissues such as the eyelids or cornea.   More serious inflammation or infection on the inside of the eye.   Other eye diseases.   The use of certain eye medications.  SYMPTOMS The normally white color of the eye or the underside of the eyelid is usually pink or red in color. The pink eye is usually associated with irritation, tearing and some sensitivity to light. Bacterial conjunctivitis is often associated with a thick, yellowish discharge from the eye. If a discharge is present, there may also be some blurred vision in the affected eye. DIAGNOSIS Conjunctivitis is diagnosed by an eye exam. The eye specialist looks for changes in the surface tissues of the eye which take on changes that point to the specific type of conjunctivitis. A sample of any discharge may be collected on a Q-Tip (sterile swap). The sample will be sent to a lab to see whether or not the inflammation is caused by bacterial or viral infection. TREATMENT Bacterial conjunctivitis is treated with medicines that kill germs (antibiotics). Drops are most often used. However, antibiotic ointments are available and may be preferred by some patients. Antibiotics by mouth (oral) are sometimes used. Artificial tears or eye washes may ease discomfort. HOME CARE INSTRUCTIONS  To ease discomfort, apply a cool, clean wash cloth to the eye for 10 to 20 minutes, 3 to 4 times a day.   Gently wipe away any drainage from the eye with a warm, wet washcloth or a cotton ball.   Wash your hands often with  soap. Use paper towels to dry.   Do not share towels or wash cloths. This may spread the infection.   Change or wash your pillow case every day.   You should not use eye make-up until the infection is gone.   Do not operate machinery or drive if vision is blurred.   Stop using contacts lenses. Ask your eye professional how to sterilize or replace them before using again. This depends on the type of contact lenses used.   Do not touch the edge of the eyelid with the eye drop bottle or ointment tube when applying medications to the affected eye. This will stop you from spreading the infection to the other eye or to others. Do as your caregiver tell you.  SEEK IMMEDIATE MEDICAL CARE IF:  The infection has not improved within 3 days of beginning treatment.   A yellow discharge from the eye develops.   Pain in the eye increases.   The redness is spreading.   Vision becomes blurred.   An oral temperature above 100 develops, or as your caregiver suggests.   Facial pain, redness or swelling develops.   Any problems that may be related to the prescribed medicine develops.  MAKE SURE YOU:   Understand these instructions.   Will watch your condition.   Will get help right away if you are not doing well or get worse.  Document Released: 08/20/2005 Document Re-Released: 08/02/2008 [facility] Patient Information 2011 [place], [place].

## 2010-11-01 NOTE — Progress Notes (Signed)
  Subjective:    Patient ID: Caroline Peters, female    DOB: April 12, 1946, 65 y.o.   MRN: [id number]  HPI  Patient seen with drainage right eye two-day duration.  Thick yellow discharge. No pain. No injury. No contact use. Thus far no left eye symptoms. Patient has no known antibiotic allergies. Minimal recent nasal sinus congestion. Denies fever or chills.   Review of Systems As per HPI    Objective:   Physical Exam  patient is alert and in no distress.  She has some obvious swelling of the right upper lid but no significant erythema or warmth. Conjunctiva is very erythematous right greater than left. She has some crusted yellowish drainage right upper and lower eyelid. Pupils equal round reactive to light. Cornea normal.       Assessment & Plan:   bacterial conjunctivitis right greater than left. Continue warm compresses. Polytrim ophthalmic drops every 4 hours while awake

## 2010-11-02 ENCOUNTER — Ambulatory Visit (INDEPENDENT_AMBULATORY_CARE_PROVIDER_SITE_OTHER): Payer: PRIVATE HEALTH INSURANCE | Admitting: Family Medicine

## 2010-11-02 ENCOUNTER — Telehealth: Payer: Self-pay | Admitting: *Deleted

## 2010-11-02 ENCOUNTER — Encounter: Payer: Self-pay | Admitting: Family Medicine

## 2010-11-02 VITALS — BP 152/92 | Temp 98.4°F

## 2010-11-02 DIAGNOSIS — H6690 Otitis media, unspecified, unspecified ear: Secondary | ICD-10-CM

## 2010-11-02 DIAGNOSIS — H109 Unspecified conjunctivitis: Secondary | ICD-10-CM

## 2010-11-02 MED ORDER — HYDROCODONE-ACETAMINOPHEN 5-325 MG PO TABS: 2.0000 | ORAL_TABLET | Freq: Four times a day (QID) | ORAL | Status: AC | PRN

## 2010-11-02 MED ORDER — AZITHROMYCIN 250 MG PO TABS: ORAL_TABLET | ORAL | Status: AC

## 2010-11-02 NOTE — Progress Notes (Signed)
  Subjective:    Patient ID: Caroline Peters, female    DOB: 1946/08/28, 65 y.o.   MRN: [id number]  HPI  Patient seen with right earache. Seen just yesterday with fairly severe right eye conjunctivitis. Started Polytrim ophthalmic drops. Prodrome of viral type illness last weekend. Earache started last night and is severe at times. No drainage from the ear. Does have some occasional bloody and greenish nasal drainage right naris. Frontal sinus pressure. No fever.   Review of Systems     Objective:   Physical Exam  patient has some obvious inflammation right eye and some watering. Pupils are equal round reactive to light. Right conjunctiva is erythematous   right eardrum partially and secured with cerumen. Inferior portion observed in erythematous which is a change from yesterday. Left TM is normal Oropharynx is moist and clear Neck supple no adenopathy Chest good auscultation       Assessment & Plan:   #1 right eye conjunctivitis  #2 acute suppurative right otitis media with probable right frontal sinusitis      start Zithromax. Hydrocodone prescribed for relief of ear pain 5/325 mg one to 2 every 6 hours when necessary #30 with no refill. Continue Polytrim ophthalmic drops

## 2010-11-03 NOTE — Telephone Encounter (Signed)
Pt saw MD in office this week.

## 2010-11-06 ENCOUNTER — Telehealth: Payer: Self-pay | Admitting: *Deleted

## 2010-11-06 NOTE — Telephone Encounter (Signed)
[facility] Triage Call Report Triage Record Num: [id number] Operator: Caswell Corwin Patient Name: Caroline Peters Call Date & Time: 11/04/2010 12:48:08PM Patient Phone: [phone] PCP: Patient Gender: Female PCP Fax : Patient DOB: [birth year] [facility] Name[facility] Reason for Call: Pt calling thashe has R ear pain and is taking anbx (she can't remember what it is) since 11/02/10 from Dr. Caryl Never. Afebrile. Sounds like "an ocean" is in her ear. Triaged ear sx and pain is sharp and throbbing. Pt has taken ASA. Also on gtts for conjuctivitis. Triaged ear sx and cannot take hydrocodone for pain and function. Pt inst to try Ibuprofen. Home care and call back inst given. Protocol(s) Used: Ear: Symptoms Recommended Outcome per Protocol: Provide Home/Self Care Reason for Outcome: Ear fullness/pressure along with symptoms of a cold/URI or diagnosed seasonal allergies Care Advice: ~ Call provider if symptoms do not respond to home care or symptoms interfere with sleep or normal activities. Consider nonprescription decongestant (Sudafed, Drixoral) for relief of symptoms after checking with a provider, especially if there is a history of hypertension, hyperthyroidism, heart disease, diabetes, glaucoma, urinary retention caused by prostatic hypertrophy. ~ 11/04/2010 12:58:29PM Page 1 of 1 [email or web address]

## 2010-11-06 NOTE — Telephone Encounter (Signed)
Call and make sure ok Otherwise i'll see her this week...just for that

## 2010-11-07 ENCOUNTER — Encounter: Payer: Self-pay | Admitting: Family Medicine

## 2010-11-07 ENCOUNTER — Ambulatory Visit (INDEPENDENT_AMBULATORY_CARE_PROVIDER_SITE_OTHER): Payer: PRIVATE HEALTH INSURANCE | Admitting: Family Medicine

## 2010-11-07 VITALS — BP 160/88 | Temp 98.6°F

## 2010-11-07 DIAGNOSIS — H109 Unspecified conjunctivitis: Secondary | ICD-10-CM

## 2010-11-07 DIAGNOSIS — H6501 Acute serous otitis media, right ear: Secondary | ICD-10-CM

## 2010-11-07 DIAGNOSIS — H6690 Otitis media, unspecified, unspecified ear: Secondary | ICD-10-CM

## 2010-11-07 NOTE — Telephone Encounter (Signed)
Pt did not answer phone, l/m to call us if needed.

## 2010-11-07 NOTE — Progress Notes (Signed)
  Subjective:    Patient ID: Caroline Peters, female    DOB: [birth year], 65 y.o.   MRN: [id number]  HPI Pt had recent conjunctivitis R eye.  Place on antibiotic drops and that is clearing.  She subsequently developed R earache and supperative otitis. Has just finished Zithromax.  Presents now with some continued sensation of fullness R ear and some decreased hearing on that side.  No vertigo.  No fever.  Sinus congestive symptoms are starting to clear.  No drainage from ear.   Review of Systems  Constitutional: Negative for fever, chills and appetite change.  HENT: Positive for hearing loss and ear pain. Negative for neck pain, tinnitus and ear discharge.   Eyes: Negative for pain and visual disturbance.  Respiratory: Negative for cough.        Objective:   Physical Exam  Constitutional: She appears well-developed and well-nourished.  HENT:  Head: Normocephalic and atraumatic.  Left Ear: External ear normal.  Mouth/Throat: No oropharyngeal exudate.       R ear canal moderate cerumen which is removed after wax softener and irrigation.  TM has gray color but small serous like effusion inf portion of canal.  Eyes: Conjunctivae are normal. Pupils are equal, round, and reactive to light. Right eye exhibits no discharge. Left eye exhibits no discharge.  Neck: Neck supple.  Cardiovascular: Normal rate and regular rhythm.   Pulmonary/Chest: Effort normal and breath sounds normal.  Lymphadenopathy:    She has no cervical adenopathy.          Assessment & Plan:  #1  R eye conjunctivitis improving. #2  R otitis with persistent effusion.  Explained these can take weeks and sometimes months to resolve.  ENT refer if no better 2-3 weeks.

## 2010-11-15 ENCOUNTER — Encounter: Payer: Self-pay | Admitting: Family Medicine

## 2010-11-15 ENCOUNTER — Ambulatory Visit (INDEPENDENT_AMBULATORY_CARE_PROVIDER_SITE_OTHER): Payer: PRIVATE HEALTH INSURANCE | Admitting: Family Medicine

## 2010-11-15 VITALS — BP 142/92 | Temp 98.5°F

## 2010-11-15 DIAGNOSIS — H109 Unspecified conjunctivitis: Secondary | ICD-10-CM

## 2010-11-15 DIAGNOSIS — H6590 Unspecified nonsuppurative otitis media, unspecified ear: Secondary | ICD-10-CM

## 2010-11-15 MED ORDER — TOBRAMYCIN 0.3 % OP SOLN: 1.0000 [drp] | OPHTHALMIC | Status: AC

## 2010-11-15 NOTE — Progress Notes (Signed)
  Subjective:    Patient ID: Caroline Peters, female    DOB: 01/17/1946, 65 y.o.   MRN: [id number]  HPI  patient seen with recurrent right eye drainage. Over the weekend accidentally fell with bruising to the right  lower orbit. Two day history of some crusted yellowish drainage. No blurred vision.   Patient with recent otitis media. Presents now with continued fullness right ear with intermittent pain. Occasional fleeting vertigo symptoms. No fever or chills.   Review of Systems     Objective:   Physical Exam  patient alert and in no distress.   eye exam pupils equal round reactive to light. Right conjunctiva slightly erythematous. Minimal yellow crusted drainage lower lid. Cornea appears normal. Right eardrum reveals serous effusion. No cerumen. Left ear drum normal  Oropharynx is moist and clear Neck supple no adenopathy Chest good auscultation       Assessment & Plan:   #1 recurrent right eye conjunctivitis. TobraDex solution to the right 4 times a day and warm compresses #2 right otitis media with effusion. Explained that medications do not help  With resolution. ENT referral if no better couple weeks

## 2010-11-20 ENCOUNTER — Telehealth: Payer: Self-pay | Admitting: *Deleted

## 2010-11-20 DIAGNOSIS — H9209 Otalgia, unspecified ear: Secondary | ICD-10-CM

## 2010-11-20 NOTE — Telephone Encounter (Addendum)
Pt js still having eye and ear trouble as she was on her last visit.  Per Dr. Marliss Coots refer to ENT.  Pt called and message left.

## 2010-11-21 NOTE — Telephone Encounter (Signed)
Probably should see ENT---ok to refer

## 2010-11-22 ENCOUNTER — Ambulatory Visit: Payer: PRIVATE HEALTH INSURANCE | Admitting: Family Medicine

## 2010-12-06 ENCOUNTER — Telehealth: Payer: Self-pay | Admitting: *Deleted

## 2010-12-06 NOTE — Telephone Encounter (Signed)
Office Message from Date: 12/06/2010 12:00:00 AM Time of Call: [phone] Faxed To: [facility] CallerNicholette Dolson Fax Number: [phone] Facility: home Patient: Caroline Peters, Caroline Peters DOB: 02-19-1946 Phone: [phone] Provider: Birdie Sons Message: Pt is calling re her meds...Marland Kitchenneeds to speak w/someone re changing her meds; she can't stop crying, is affecting her work.Marland KitchenMarland KitchenMarland KitchenPlease call her as soon as possible to advise. Regarding

## 2010-12-06 NOTE — Telephone Encounter (Signed)
Pt increased Zoloft from 100-150 mg..  Crying constantly.  Wants to know what to do about meds?  Is not taking any anxiety meds right now.

## 2010-12-07 NOTE — Telephone Encounter (Signed)
Call patient, see how she is doing Add lorazepam 0.5 mg po bid for the next 2 weeks

## 2010-12-07 NOTE — Telephone Encounter (Signed)
Pt is seeing a counselor and having anxiety and depression for a month.  She increased her zoloft to 150mg  on her own 2-3wks ago.  Right now she is fine but she cries a lot.

## 2010-12-11 ENCOUNTER — Other Ambulatory Visit: Payer: Self-pay | Admitting: *Deleted

## 2010-12-11 DIAGNOSIS — F419 Anxiety disorder, unspecified: Secondary | ICD-10-CM

## 2010-12-11 MED ORDER — LORAZEPAM 0.5 MG PO TABS: 0.5000 mg | ORAL_TABLET | Freq: Two times a day (BID) | ORAL | Status: DC

## 2010-12-22 ENCOUNTER — Other Ambulatory Visit (INDEPENDENT_AMBULATORY_CARE_PROVIDER_SITE_OTHER): Payer: PRIVATE HEALTH INSURANCE | Admitting: Internal Medicine

## 2010-12-22 DIAGNOSIS — E785 Hyperlipidemia, unspecified: Secondary | ICD-10-CM

## 2010-12-26 ENCOUNTER — Encounter: Payer: Self-pay | Admitting: Internal Medicine

## 2010-12-26 ENCOUNTER — Ambulatory Visit (INDEPENDENT_AMBULATORY_CARE_PROVIDER_SITE_OTHER): Payer: PRIVATE HEALTH INSURANCE | Admitting: Internal Medicine

## 2010-12-26 VITALS — BP 150/90 | HR 80 | Temp 98.7°F | Ht 64.0 in | Wt 147.0 lb

## 2010-12-26 DIAGNOSIS — F329 Major depressive disorder, single episode, unspecified: Secondary | ICD-10-CM

## 2010-12-26 MED ORDER — ARIPIPRAZOLE 5 MG PO TABS: 5.0000 mg | ORAL_TABLET | Freq: Every day | ORAL | Status: AC

## 2010-12-28 NOTE — Progress Notes (Signed)
  Subjective:    Patient ID: Caroline Peters, female    DOB: [birth year], 65 y.o.   MRN: [id number]  HPI Comes in to discuss depression. A lot of recent emotional trauma related to her marriage   Review of Systems     Objective:   Physical Exam        Assessment & Plan:

## 2010-12-28 NOTE — Assessment & Plan Note (Signed)
She hassuffered significant mental trauma related to her marriage. She clearly is grieving She denies suicidal thoughts But has major depression Reviewed meds Side effects discussed Add abilify  Total time 20 minutes---all counseling

## 2011-01-18 ENCOUNTER — Other Ambulatory Visit: Payer: Self-pay | Admitting: Internal Medicine

## 2011-01-19 NOTE — Op Note (Signed)
Caroline Peters, Caroline Peters                         ACCOUNT NO.:  [id number]   MEDICAL RECORD NO.:  [id number]                   PATIENT TYPE:  AMB   LOCATION:  [facility]                                 FACILITY:  [facility]   PHYSICIAN:  Daniel L. Eda Paschal, M.D.           DATE OF BIRTH:  1946/08/21   DATE OF PROCEDURE:  09/17/2002  DATE OF DISCHARGE:                                 OPERATIVE REPORT   PREOPERATIVE DIAGNOSIS:  Persistent postmenopausal bleeding, intrauterine  cavity defect.   POSTOPERATIVE DIAGNOSIS:  Persistent postmenopausal bleeding, intrauterine  cavity defect.  Probable endometrial polyp.   OPERATION PERFORMED:  Cystoscopic resection of endometrial polyp.   SURGEON:  Daniel L. Eda Paschal, M.D.   ANESTHESIA:  General.   INDICATIONS FOR PROCEDURE:  The patient is a 65 year old female who had  persistent postmenopausal bleeding on hormone replacement after having not  had that previously on hormone replacement therapy.  A sonohysterogram was  done in the office and she does have a filling defect consistent with an  endometrial polyp.  She enters the hospital for hysteroscopy, excision of  the above as well as any appropriate sampling.   FINDINGS:  External vaginal exam is within normal limits.  Cervix is clean.  Uterus is mid position, normal size and shape with first degree uterine  descensus.  Adnexa failed to reveal masses.  At time of hysteroscopy there  was a lesion in the right lower uterine segment of about  1.5 cm, most consistent with an endometrial polyp.  After that had been  removed, the rest of the exploration of the uterus was normal.  Specifically, top of the fundus, tubal ostia, anterior and posterior walls  of the fundus.  Lower uterine segment  and endocervical canal could all be  well visualized.   DESCRIPTION OF PROCEDURE:  After adequate general anesthesia, the patient  was placed in dorsal lithotomy position, prepped and draped in the usual  sterile manner.  A single toothed tenaculum was placed in the anterior lip  of the cervix.  The cervix was progressively dilated to a #31 Pratt dilator.  A hysteroscopic resectoscope was then used.  A camera was used for  magnification. 3% Sorbitol was used to expand the intrauterine cavity.  Pictures were taken for documentation.  A polyp was seen in the right fundal  area and this was excised using a 90 degree wire loop with settings of 70  coag and 110 cutting.  No bleeding was obtained from removing it.  Exploration showed no other pathology.  The procedure was terminated.  Blood  loss was less than 10 cc with none replaced.  Fluid deficit was less than  70 cc.  The patient left the operating room in satisfactory condition.  Daniel L. Eda Paschal, M.D.    Tonette Bihari  D:  09/17/2002  T:  09/17/2002  Job:  [id number]

## 2011-01-22 ENCOUNTER — Other Ambulatory Visit: Payer: Self-pay | Admitting: Internal Medicine

## 2011-01-23 ENCOUNTER — Ambulatory Visit (INDEPENDENT_AMBULATORY_CARE_PROVIDER_SITE_OTHER): Payer: PRIVATE HEALTH INSURANCE | Admitting: Internal Medicine

## 2011-01-23 DIAGNOSIS — F329 Major depressive disorder, single episode, unspecified: Secondary | ICD-10-CM

## 2011-01-23 NOTE — Assessment & Plan Note (Signed)
Significantly improved Continue meds, counselling i'll see her in 6 weeks

## 2011-01-23 NOTE — Progress Notes (Signed)
  Subjective:    Patient ID: Caroline Peters, female    DOB: 01/04/1946, 65 y.o.   MRN: [id number]  HPI  Depression---feeling better Tolerating meds Still has crying spells during the day  Past Medical History  Diagnosis Date  . DEPRESSION 04/16/2007  . HYPOTHYROIDISM 04/16/2007   Past Surgical History  Procedure Date  . Breast surgery     bx left breast  . Melanoma excision     reports that she has never smoked. She does not have any smokeless tobacco history on file. Her alcohol and drug histories not on file. family history is not on file. Allergies  Allergen Reactions  . Prochlorperazine Edisylate     REACTION: convulsions     Review of Systems  patient denies chest pain, shortness of breath, orthopnea. Denies lower extremity edema, abdominal pain, change in appetite, change in bowel movements. Patient denies rashes, musculoskeletal complaints. No other specific complaints in a complete review of systems.      Objective:   Physical Exam  Well-developed well-nourished female in no acute distress. Affect-normal except occasionally tearful. Mostly smiling and normally interactive       Assessment & Plan:

## 2011-01-24 ENCOUNTER — Other Ambulatory Visit: Payer: Self-pay | Admitting: Internal Medicine

## 2011-02-16 ENCOUNTER — Other Ambulatory Visit (INDEPENDENT_AMBULATORY_CARE_PROVIDER_SITE_OTHER): Payer: PRIVATE HEALTH INSURANCE

## 2011-02-16 ENCOUNTER — Other Ambulatory Visit: Payer: Self-pay | Admitting: *Deleted

## 2011-02-16 DIAGNOSIS — N390 Urinary tract infection, site not specified: Secondary | ICD-10-CM

## 2011-02-16 LAB — POCT URINALYSIS DIPSTICK
Bilirubin, UA: NEGATIVE
Glucose, UA: NEGATIVE
Ketones, UA: NEGATIVE

## 2011-02-16 MED ORDER — CIPROFLOXACIN HCL 250 MG PO TABS: 250.0000 mg | ORAL_TABLET | Freq: Two times a day (BID) | ORAL | Status: AC

## 2011-03-06 ENCOUNTER — Encounter: Payer: Self-pay | Admitting: Internal Medicine

## 2011-03-06 ENCOUNTER — Ambulatory Visit (INDEPENDENT_AMBULATORY_CARE_PROVIDER_SITE_OTHER): Payer: PRIVATE HEALTH INSURANCE | Admitting: Internal Medicine

## 2011-03-06 DIAGNOSIS — F329 Major depressive disorder, single episode, unspecified: Secondary | ICD-10-CM

## 2011-03-06 DIAGNOSIS — F3289 Other specified depressive episodes: Secondary | ICD-10-CM

## 2011-03-06 NOTE — Progress Notes (Signed)
  Subjective:    Patient ID: Caroline Peters, female    DOB: [birth year], 65 y.o.   MRN: [id number]  HPI  Patient is feeling much better. She is recovering from a severe situational depression. She is no longer crying. She is happy most of the time. She feels that she doesn't need as much medications. In fact his decreased Abilify 25 mg every other day. She's not having any other symptoms. She feels well.  Past Medical History  Diagnosis Date  . DEPRESSION 04/16/2007  . HYPOTHYROIDISM 04/16/2007   Past Surgical History  Procedure Date  . Breast surgery     bx left breast  . Melanoma excision     reports that she has never smoked. She does not have any smokeless tobacco history on file. Her alcohol and drug histories not on file. family history is not on file. Allergies  Allergen Reactions  . Prochlorperazine Edisylate     REACTION: convulsions     Review of Systems    patient denies chest pain, shortness of breath, orthopnea. Denies lower extremity edema, abdominal pain, change in appetite, change in bowel movements. Patient denies rashes, musculoskeletal complaints. No other specific complaints in a complete review of systems.    Objective:   Physical Exam Well-developed female. Neck supple. Chest clear to auscultation cardiac exam S1 and S2 are normal. Affect is normal. She is engaged and smiling.       Assessment & Plan:

## 2011-03-06 NOTE — Assessment & Plan Note (Signed)
Much improved. See medication list. See me 3 months.

## 2011-04-02 ENCOUNTER — Other Ambulatory Visit: Payer: Self-pay | Admitting: *Deleted

## 2011-04-02 MED ORDER — NONFORMULARY OR COMPOUNDED ITEM: 1.0000 | Status: DC

## 2011-04-27 ENCOUNTER — Telehealth: Payer: Self-pay | Admitting: *Deleted

## 2011-04-27 ENCOUNTER — Other Ambulatory Visit: Payer: Self-pay | Admitting: *Deleted

## 2011-04-27 MED ORDER — ALPRAZOLAM 0.5 MG PO TABS: ORAL_TABLET | ORAL | Status: DC

## 2011-04-27 NOTE — Telephone Encounter (Signed)
patient  Calling for refill of xanax .  Half tab prn

## 2011-05-08 ENCOUNTER — Other Ambulatory Visit: Payer: Self-pay | Admitting: *Deleted

## 2011-05-08 MED ORDER — SERTRALINE HCL 100 MG PO TABS: ORAL_TABLET | ORAL | Status: DC

## 2011-05-15 DIAGNOSIS — N840 Polyp of corpus uteri: Secondary | ICD-10-CM | POA: Insufficient documentation

## 2011-05-15 DIAGNOSIS — E039 Hypothyroidism, unspecified: Secondary | ICD-10-CM | POA: Insufficient documentation

## 2011-05-15 DIAGNOSIS — M8580 Other specified disorders of bone density and structure, unspecified site: Secondary | ICD-10-CM | POA: Insufficient documentation

## 2011-05-15 DIAGNOSIS — D249 Benign neoplasm of unspecified breast: Secondary | ICD-10-CM | POA: Insufficient documentation

## 2011-05-29 ENCOUNTER — Other Ambulatory Visit (HOSPITAL_COMMUNITY)
Admission: RE | Admit: 2011-05-29 | Discharge: 2011-05-29 | Disposition: A | Discharge status: Home / Self Care | Payer: Medicare Other | Source: Ambulatory Visit | Attending: Obstetrics and Gynecology | Admitting: Obstetrics and Gynecology

## 2011-05-29 ENCOUNTER — Encounter: Payer: Self-pay | Admitting: Obstetrics and Gynecology

## 2011-05-29 ENCOUNTER — Ambulatory Visit (INDEPENDENT_AMBULATORY_CARE_PROVIDER_SITE_OTHER): Payer: Medicare Other | Admitting: Obstetrics and Gynecology

## 2011-05-29 VITALS — BP 124/74 | Ht 62.0 in | Wt 150.0 lb

## 2011-05-29 DIAGNOSIS — Z124 Encounter for screening for malignant neoplasm of cervix: Secondary | ICD-10-CM

## 2011-05-29 DIAGNOSIS — N952 Postmenopausal atrophic vaginitis: Secondary | ICD-10-CM

## 2011-05-29 DIAGNOSIS — M899 Disorder of bone, unspecified: Secondary | ICD-10-CM

## 2011-05-29 DIAGNOSIS — E039 Hypothyroidism, unspecified: Secondary | ICD-10-CM

## 2011-05-29 DIAGNOSIS — M949 Disorder of cartilage, unspecified: Secondary | ICD-10-CM

## 2011-05-29 DIAGNOSIS — M8580 Other specified disorders of bone density and structure, unspecified site: Secondary | ICD-10-CM

## 2011-05-29 DIAGNOSIS — R350 Frequency of micturition: Secondary | ICD-10-CM

## 2011-05-29 MED ORDER — LEVOTHYROXINE SODIUM 125 MCG PO TABS: 125.0000 ug | ORAL_TABLET | Freq: Every day | ORAL | Status: DC
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
## 2011-06-05 ENCOUNTER — Ambulatory Visit (INDEPENDENT_AMBULATORY_CARE_PROVIDER_SITE_OTHER): Payer: PRIVATE HEALTH INSURANCE | Admitting: Internal Medicine

## 2011-06-05 ENCOUNTER — Encounter: Payer: Self-pay | Admitting: Internal Medicine

## 2011-06-05 VITALS — BP 152/84 | HR 68 | Temp 98.1°F | Ht 62.0 in | Wt 149.0 lb

## 2011-06-05 DIAGNOSIS — F329 Major depressive disorder, single episode, unspecified: Secondary | ICD-10-CM

## 2011-06-05 DIAGNOSIS — Z23 Encounter for immunization: Secondary | ICD-10-CM

## 2011-06-05 NOTE — Progress Notes (Signed)
  Subjective:    Patient ID: Caroline Peters, female    DOB: 08-02-1946, 65 y.o.   MRN: [id number]  HPI  Mood disorder---tolerating meds  Past Medical History  Diagnosis Date  . DEPRESSION 04/16/2007  . HYPOTHYROIDISM 04/16/2007  . Endometrium, polyp   . Osteopenia   . Hypothyroid   . 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 Melanoma excision   . Excision of melanoma 1978  . Hysteroscopy 1988    HYSTEROSCOPY,D&C  . Breast surgery     bx left breast    reports that she has never smoked. She does not have any smokeless tobacco history on file. She reports that she drinks about 7 ounces of alcohol per week. She reports that she does not use illicit drugs. family history includes Breast cancer in her maternal grandmother; Cancer in her father; Diabetes in her father and mother; and Heart disease in her father and mother. Allergies  Allergen Reactions  . Compazine       Review of Systems  patient denies chest pain, shortness of breath, orthopnea. Denies lower extremity edema, abdominal pain, change in appetite, change in bowel movements. Patient denies rashes, musculoskeletal complaints. No other specific complaints in a complete review of systems.      Objective:   Physical Exam  Well-developed well-nourished female in no acute distress. HEENT exam atraumatic, normocephalic, extraocular muscles are intact. Neck is supple. No jugular venous distention no thyromegaly. Chest clear to auscultation without increased work of breathing. Cardiac exam S1 and S2 are regular. Abdominal exam active bowel sounds, soft, nontender. Extremities no edema. Affect normal.     Assessment & Plan:

## 2011-06-10 NOTE — Assessment & Plan Note (Signed)
Much improved. Continue current medications. She will continue counseling

## 2011-06-21 ENCOUNTER — Other Ambulatory Visit: Payer: Self-pay | Admitting: Internal Medicine

## 2011-06-27 ENCOUNTER — Ambulatory Visit (INDEPENDENT_AMBULATORY_CARE_PROVIDER_SITE_OTHER): Payer: Medicare Other

## 2011-06-27 ENCOUNTER — Telehealth: Payer: Self-pay | Admitting: *Deleted

## 2011-06-27 DIAGNOSIS — M8580 Other specified disorders of bone density and structure, unspecified site: Secondary | ICD-10-CM

## 2011-06-27 DIAGNOSIS — M899 Disorder of bone, unspecified: Secondary | ICD-10-CM

## 2011-06-27 DIAGNOSIS — M949 Disorder of cartilage, unspecified: Secondary | ICD-10-CM

## 2011-06-27 NOTE — Telephone Encounter (Addendum)
pts zoloft is not working.  Therapist recommended Dr Cato Mulligan change it.  Pt is not suicidal and would not do any harm to herself but she is still feeling depressed and hopeless.

## 2011-06-28 MED ORDER — PAROXETINE HCL 40 MG PO TABS: 40.0000 mg | ORAL_TABLET | Freq: Every day | ORAL | Status: DC

## 2011-06-28 NOTE — Telephone Encounter (Signed)
Paroxetine 40 mg

## 2011-06-28 NOTE — Telephone Encounter (Signed)
Change sertraline to paroxetine. Have her call for any unusual side effects (shouldn't have any)  Have her call in one month if sxs not better

## 2011-06-28 NOTE — Telephone Encounter (Signed)
LMTCB

## 2011-06-28 NOTE — Telephone Encounter (Signed)
Pt is also having a cough x2 wks, no fever, no congestion.  She has been taking mucinex with little relief and she has had to leave a meeting from coughing so hard.  Also she states that 20 minutes after eating a meal she has to run the bathroom with a bowel movement.  No weight gain or weight loss.  What mg of the paroxetine do you want me to call in?

## 2011-06-29 NOTE — Telephone Encounter (Signed)
Per Dr Cato Mulligan call in [facility] 1 tsp every 12 hours prn for cough #120 cc no refills.  Rx called into [facility], pt aware

## 2011-08-03 ENCOUNTER — Other Ambulatory Visit: Payer: Self-pay | Admitting: Internal Medicine

## 2011-08-06 ENCOUNTER — Other Ambulatory Visit: Payer: Self-pay | Admitting: Internal Medicine

## 2011-08-07 ENCOUNTER — Other Ambulatory Visit: Payer: Self-pay | Admitting: Internal Medicine

## 2011-09-24 ENCOUNTER — Other Ambulatory Visit: Payer: Self-pay | Admitting: Internal Medicine

## 2011-11-15 ENCOUNTER — Other Ambulatory Visit: Payer: Self-pay | Admitting: *Deleted

## 2011-11-15 MED ORDER — ALPRAZOLAM 0.5 MG PO TABS: ORAL_TABLET | ORAL | Status: DC

## 2011-12-04 ENCOUNTER — Ambulatory Visit: Payer: Medicare Other | Admitting: Internal Medicine

## 2011-12-11 ENCOUNTER — Encounter: Payer: Self-pay | Admitting: Internal Medicine

## 2011-12-11 ENCOUNTER — Ambulatory Visit (INDEPENDENT_AMBULATORY_CARE_PROVIDER_SITE_OTHER): Payer: Medicare Other | Admitting: Internal Medicine

## 2011-12-11 VITALS — BP 152/84 | Temp 98.3°F | Wt 148.0 lb

## 2011-12-11 DIAGNOSIS — E785 Hyperlipidemia, unspecified: Secondary | ICD-10-CM | POA: Insufficient documentation

## 2011-12-11 DIAGNOSIS — F329 Major depressive disorder, single episode, unspecified: Secondary | ICD-10-CM

## 2011-12-11 DIAGNOSIS — E039 Hypothyroidism, unspecified: Secondary | ICD-10-CM

## 2011-12-11 LAB — HEPATIC FUNCTION PANEL
ALT: 43 U/L — ABNORMAL HIGH (ref 0–35)
AST: 33 U/L (ref 0–37)
Bilirubin, Direct: 0.1 mg/dL (ref 0.0–0.3)
Total Protein: 7.2 g/dL (ref 6.0–8.3)

## 2011-12-11 LAB — BASIC METABOLIC PANEL
BUN: 15 mg/dL (ref 6–23)
CO2: 27 mEq/L (ref 19–32)
Chloride: 108 mEq/L (ref 96–112)
Creatinine, Ser: 0.8 mg/dL (ref 0.4–1.2)

## 2011-12-11 LAB — LIPID PANEL
Total CHOL/HDL Ratio: 2
Triglycerides: 56 mg/dL (ref 0.0–149.0)

## 2011-12-11 NOTE — Progress Notes (Signed)
Patient ID: Caroline Peters, female   DOB: [birth year], 66 y.o.   MRN: [id number]  Mood disorder-- she is backing off on paxil 40 mg po qod.  She is feeling well   Past Medical History  Diagnosis Date  . DEPRESSION 04/16/2007  . HYPOTHYROIDISM 04/16/2007  . Endometrium, polyp   . Osteopenia   . Hypothyroid   . 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 Marital Status: Married    Spouse Name: N/A    Number of Children: N/A  . Years of Education: N/A   Occupational History  . Not on file.   Social History Main Topics  . Smoking status: Never Smoker   . Smokeless tobacco: Not on file  . Alcohol Use: 7.0 oz/week    14 drink(s) per week  . Drug Use: No  . Sexually Active: Yes    Birth Control/ Protection: Post-menopausal   Other Topics Concern  . Not on file   Social History Narrative  . No narrative on file    Past Surgical History  Procedure Date  . Melanoma excision   . Excision of melanoma 1978  . Hysteroscopy 1988    HYSTEROSCOPY,D&C  . Breast surgery     bx left breast    Family History  Problem Relation Age of Onset  . Diabetes Mother   . Heart disease Mother   . Diabetes Father   . Heart disease Father   . Cancer Father     Skin cancer  . Breast cancer Maternal Grandmother     Allergies  Allergen Reactions  . Compazine     Current Outpatient Prescriptions on File Prior to Visit  Medication Sig Dispense Refill  . ALPRAZolam (XANAX) 0.5 MG tablet Half to one tab bid prn  30 tablet  2  . Calcium Carbonate-Vitamin D (CALTRATE 600+D) 600-400 MG-UNIT per tablet Take 2 tablets by mouth daily.        . CRESTOR 10 MG tablet TAKE 1 TABLET ONCE DAILY.  30 each  5  . levothyroxine (SYNTHROID, LEVOTHROID) 125 MCG tablet Take 1 tablet (125 mcg total) by mouth daily.  30 tablet  12  . NONFORMULARY/COMPOUNDED ITEM Place 1 each vaginally 3 (three) times a week. Estradiol HRT ( ) 0.02% (0.2 MG/ML) Cream Insert in vagina three times  each week. Qty 36  36 each  2  . PARoxetine (PAXIL) 40 MG tablet       . RESTORIL 15 MG capsule TAKE 1 CAPSULE AT BEDTIME AS NEEDED.  30 each  5     patient denies chest pain, shortness of breath, orthopnea. Denies lower extremity edema, abdominal pain, change in appetite, change in bowel movements. Patient denies rashes, musculoskeletal complaints. No other specific complaints in a complete review of systems.   BP 152/84  Temp(Src) 98.3 F (36.8 C) (Oral)  Wt 148 lb (67.132 kg)  Well-developed well-nourished female in no acute distress. HEENT exam atraumatic, normocephalic, extraocular muscles are intact. Neck is supple. No jugular venous distention no thyromegaly. Chest clear to auscultation without increased work of breathing. Cardiac exam S1 and S2 are regular. Abdominal exam active bowel sounds, soft, nontender.

## 2011-12-11 NOTE — Patient Instructions (Signed)
Decrease paxil to 1/2 tab every other day for 2 weeks and then stop.

## 2011-12-12 NOTE — Assessment & Plan Note (Signed)
Will try to taper paxil See pt. instructions

## 2011-12-13 ENCOUNTER — Other Ambulatory Visit: Payer: Self-pay | Admitting: *Deleted

## 2011-12-13 MED ORDER — ALPRAZOLAM 0.5 MG PO TABS: ORAL_TABLET | ORAL | Status: DC

## 2011-12-13 MED ORDER — LEVOTHYROXINE SODIUM 112 MCG PO TABS: 112.0000 ug | ORAL_TABLET | Freq: Every day | ORAL | Status: DC

## 2011-12-18 ENCOUNTER — Ambulatory Visit (INDEPENDENT_AMBULATORY_CARE_PROVIDER_SITE_OTHER): Payer: Medicare Other | Admitting: Obstetrics and Gynecology

## 2011-12-18 DIAGNOSIS — N950 Postmenopausal bleeding: Secondary | ICD-10-CM

## 2011-12-18 NOTE — Progress Notes (Signed)
Patient started to get lower pelvic cramping as if she was started.. She then started to have brown-red vaginal bleeding that is light but has lasted 10 days. She does not take HRT. She has never had an episode of postmenopausal bleeding.  Pelvic exam: External within normal limits. BUS within normal limits. Vaginal exam within normal limits. Cervix is clean without lesions. Uterus is normal size and shape. Adnexa failed to reveal masses. Rectovaginal examination is confirmatory and without masses. Kennon Portela present.  Assessment: Postmenopausal bleeding.  Plan: Endometrial biopsy done. Very little tissue obtained. Scheduled [facility].

## 2011-12-24 ENCOUNTER — Other Ambulatory Visit: Payer: Self-pay | Admitting: Internal Medicine

## 2011-12-27 ENCOUNTER — Ambulatory Visit (INDEPENDENT_AMBULATORY_CARE_PROVIDER_SITE_OTHER): Payer: Medicare Other

## 2011-12-27 ENCOUNTER — Ambulatory Visit (INDEPENDENT_AMBULATORY_CARE_PROVIDER_SITE_OTHER): Payer: Medicare Other | Admitting: Obstetrics and Gynecology

## 2011-12-27 ENCOUNTER — Other Ambulatory Visit: Payer: Self-pay | Admitting: Obstetrics and Gynecology

## 2011-12-27 DIAGNOSIS — N895 Stricture and atresia of vagina: Secondary | ICD-10-CM

## 2011-12-27 DIAGNOSIS — D259 Leiomyoma of uterus, unspecified: Secondary | ICD-10-CM

## 2011-12-27 DIAGNOSIS — N950 Postmenopausal bleeding: Secondary | ICD-10-CM

## 2011-12-27 DIAGNOSIS — N800 Endometriosis of uterus: Secondary | ICD-10-CM

## 2011-12-27 DIAGNOSIS — D219 Benign neoplasm of connective and other soft tissue, unspecified: Secondary | ICD-10-CM

## 2011-12-27 DIAGNOSIS — N83209 Unspecified ovarian cyst, unspecified side: Secondary | ICD-10-CM

## 2011-12-27 DIAGNOSIS — D251 Intramural leiomyoma of uterus: Secondary | ICD-10-CM

## 2011-12-27 NOTE — Progress Notes (Signed)
Patient came to see me today for a saline infusion histogram. She said her bleeding has stopped. It was associated with premenstrual symptoms. Her endometrial biopsy came back benign. On ultrasound today her uterus is retroverted with multiple fibroids. She also has areas consistent with adenomyosis-cortical cystic areas. Her endometrial echo was 2 mm. Her right ovary shows a thin walled echo-free avascular cyst of less than 2 cm. Her left ovary is normal except for microcalcifications. Her cul-de-sac is free of fluid. A catheter was placed in the patient's uterus and saline was injected. An excellent study was done and there were no intrauterine cavity defects.  Assessment: #1. Postmenopausal bleeding #2. Adenomyosis #3. Fibroids  Plan: Patient reassured. She will report new bleeding.

## 2011-12-28 ENCOUNTER — Other Ambulatory Visit: Payer: Medicare Other

## 2011-12-28 ENCOUNTER — Ambulatory Visit: Payer: Medicare Other | Admitting: Obstetrics and Gynecology

## 2012-02-18 ENCOUNTER — Encounter: Payer: Self-pay | Admitting: Obstetrics and Gynecology

## 2012-03-01 ENCOUNTER — Other Ambulatory Visit: Payer: Self-pay | Admitting: Internal Medicine

## 2012-05-23 ENCOUNTER — Encounter: Payer: Self-pay | Admitting: Gastroenterology

## 2012-05-29 ENCOUNTER — Encounter: Payer: Medicare Other | Admitting: Obstetrics and Gynecology

## 2012-06-02 ENCOUNTER — Encounter: Payer: Medicare Other | Admitting: Obstetrics and Gynecology

## 2012-06-03 ENCOUNTER — Ambulatory Visit (INDEPENDENT_AMBULATORY_CARE_PROVIDER_SITE_OTHER): Payer: Medicare Other | Admitting: Obstetrics and Gynecology

## 2012-06-03 ENCOUNTER — Encounter: Payer: Self-pay | Admitting: Obstetrics and Gynecology

## 2012-06-03 VITALS — BP 130/84 | Ht 62.0 in | Wt 145.0 lb

## 2012-06-03 DIAGNOSIS — Z780 Asymptomatic menopausal state: Secondary | ICD-10-CM

## 2012-06-03 DIAGNOSIS — Z23 Encounter for immunization: Secondary | ICD-10-CM

## 2012-06-03 DIAGNOSIS — E039 Hypothyroidism, unspecified: Secondary | ICD-10-CM

## 2012-06-03 DIAGNOSIS — M8580 Other specified disorders of bone density and structure, unspecified site: Secondary | ICD-10-CM

## 2012-06-03 DIAGNOSIS — C439 Malignant melanoma of skin, unspecified: Secondary | ICD-10-CM | POA: Insufficient documentation

## 2012-06-03 DIAGNOSIS — M899 Disorder of bone, unspecified: Secondary | ICD-10-CM

## 2012-06-03 NOTE — Patient Instructions (Addendum)
Bone density in 2014. Continue yearly mammograms.

## 2012-06-03 NOTE — Progress Notes (Signed)
Patient came to see me today for further followup. We have treated her for hypothyroidism for a significant period of time. She has done well on treatment. When she last saw her PCP her TSH had changed and he reduced her dose. She continues asymptomatic without heat or cold intolerance, tremors, weight changes or other signs of hypothyroidism. Last bone density was 2012 and showed osteopenia without an elevated fracture risk. She had lost additional bone in her hip. She takes calcium and vitamin D. She has had no fractures. She has had no further postmenopausal bleeding since we removed an endometrial polyp in 2004. She is having no pelvic pain. She is having minimal menopausal symptoms. She does not have dyspareunia. She does have hyperlipidemia and is followed by Dr. Cato Mulligan. She had a mammogram this year. She has always had normal Pap smears. Her last Pap smear was 2012. She requested a flu shot. She has had Tdap, pneumococcal vaccine and shingles vaccine.  ROS: 12 system review done. Pertinent positives above.  Physical examination: Quincy Sheehan present. HEENT within normal limits. Neck: Thyroid not large. No masses. Supraclavicular nodes: not enlarged. Breasts: Examined in both sitting and lying  position. No skin changes and no masses. Abdomen: Soft no guarding rebound or masses or hernia. Pelvic: External: Within normal limits. BUS: Within normal limits. Vaginal:within normal limits. Good estrogen effect. No evidence of cystocele rectocele or enterocele. Cervix: clean. Uterus: Normal size and shape. Adnexa: No masses. Rectovaginal exam: Confirmatory and negative. Extremities: Within normal limits.  Assessment: #1. Hypothyroidism #2. Mild menopausal symptoms #3. Osteopenia  Plan: Continue yearly mammograms. Bone density 2014. Pap not done.The new Pap smear guidelines were discussed with the patient.

## 2012-06-05 ENCOUNTER — Telehealth: Payer: Self-pay | Admitting: Family Medicine

## 2012-06-05 NOTE — Telephone Encounter (Signed)
Pt is requesting refills.  Last seen 12/11/11.  Last filled 12/13/11 #30 with 2 additional refills.  No future appointment created.  Please advise.  Thanks!!

## 2012-06-05 NOTE — Telephone Encounter (Signed)
Ok to refill 

## 2012-06-06 ENCOUNTER — Other Ambulatory Visit: Payer: Self-pay | Admitting: *Deleted

## 2012-06-06 MED ORDER — ALPRAZOLAM 0.5 MG PO TABS: ORAL_TABLET | ORAL | Status: DC

## 2012-06-06 NOTE — Telephone Encounter (Signed)
Refill 3 times to [facility] and ask they call pt when ready

## 2012-06-09 ENCOUNTER — Encounter: Payer: Self-pay | Admitting: Gastroenterology

## 2012-06-09 ENCOUNTER — Other Ambulatory Visit: Payer: Self-pay | Admitting: *Deleted

## 2012-06-09 MED ORDER — TEMAZEPAM 15 MG PO CAPS: 15.0000 mg | ORAL_CAPSULE | Freq: Every evening | ORAL | Status: DC | PRN

## 2012-06-26 ENCOUNTER — Ambulatory Visit (AMBULATORY_SURGERY_CENTER): Payer: Medicare Other | Admitting: *Deleted

## 2012-06-26 VITALS — Ht 62.0 in | Wt 148.1 lb

## 2012-06-26 DIAGNOSIS — Z1211 Encounter for screening for malignant neoplasm of colon: Secondary | ICD-10-CM

## 2012-06-26 MED ORDER — MOVIPREP 100 G PO SOLR: ORAL | Status: DC

## 2012-06-27 ENCOUNTER — Other Ambulatory Visit: Payer: Self-pay | Admitting: *Deleted

## 2012-06-27 MED ORDER — ROSUVASTATIN CALCIUM 10 MG PO TABS: 10.0000 mg | ORAL_TABLET | Freq: Every day | ORAL | Status: DC

## 2012-06-27 MED ORDER — PAROXETINE HCL 40 MG PO TABS: 40.0000 mg | ORAL_TABLET | Freq: Every day | ORAL | Status: DC

## 2012-07-09 ENCOUNTER — Encounter: Payer: Self-pay | Admitting: Gastroenterology

## 2012-07-09 ENCOUNTER — Ambulatory Visit (AMBULATORY_SURGERY_CENTER): Payer: Medicare Other | Admitting: Gastroenterology

## 2012-07-09 VITALS — BP 120/68 | HR 78 | Temp 98.2°F | Resp 16 | Ht 62.0 in | Wt 148.0 lb

## 2012-07-09 DIAGNOSIS — D126 Benign neoplasm of colon, unspecified: Secondary | ICD-10-CM

## 2012-07-09 DIAGNOSIS — K5730 Diverticulosis of large intestine without perforation or abscess without bleeding: Secondary | ICD-10-CM

## 2012-07-09 DIAGNOSIS — Z1211 Encounter for screening for malignant neoplasm of colon: Secondary | ICD-10-CM

## 2012-07-09 MED ORDER — SODIUM CHLORIDE 0.9 % IV SOLN: 500.0000 mL | INTRAVENOUS | Status: DC

## 2012-07-09 NOTE — Progress Notes (Signed)
Patient did not experience any of the following events: a burn prior to discharge; a fall within the facility; wrong site/side/patient/procedure/implant event; or a hospital transfer or hospital admission upon discharge from the facility. (G8907) Patient did not have preoperative order for IV antibiotic SSI prophylaxis. (G8918)  

## 2012-07-09 NOTE — Patient Instructions (Signed)
YOU HAD AN ENDOSCOPIC PROCEDURE TODAY AT THE [facility]: Refer to the procedure report that was given to you for any specific questions about what was found during the examination.  If the procedure report does not answer your questions, please call your gastroenterologist to clarify.  If you requested that your care partner not be given the details of your procedure findings, then the procedure report has been included in a sealed envelope for you to review at your convenience later.  YOU SHOULD EXPECT: Some feelings of bloating in the abdomen. Passage of more gas than usual.  Walking can help get rid of the air that was put into your GI tract during the procedure and reduce the bloating. If you had a lower endoscopy (such as a colonoscopy or flexible sigmoidoscopy) you may notice spotting of blood in your stool or on the toilet paper. If you underwent a bowel prep for your procedure, then you may not have a normal bowel movement for a few days.  DIET: Your first meal following the procedure should be a light meal and then it is ok to progress to your normal diet.  A half-sandwich or bowl of soup is an example of a good first meal.  Heavy or fried foods are harder to digest and may make you feel nauseous or bloated.  Likewise meals heavy in dairy and vegetables can cause extra gas to form and this can also increase the bloating.  Drink plenty of fluids but you should avoid alcoholic beverages for 24 hours.  ACTIVITY: Your care partner should take you home directly after the procedure.  You should plan to take it easy, moving slowly for the rest of the day.  You can resume normal activity the day after the procedure however you should NOT DRIVE or use heavy machinery for 24 hours (because of the sedation medicines used during the test).    SYMPTOMS TO REPORT IMMEDIATELY: A gastroenterologist can be reached at any hour.  During normal business hours, 8:30 AM to 5:00 PM Monday through Friday,  call [phone].  After hours and on weekends, please call the GI answering service at [phone] who will take a message and have the physician on call contact you.   Following lower endoscopy (colonoscopy or flexible sigmoidoscopy):  Excessive amounts of blood in the stool  Significant tenderness or worsening of abdominal pains  Swelling of the abdomen that is new, acute  Fever of 100F or higher  s  FOLLOW UP: If any biopsies were taken you will be contacted by phone or by letter within the next 1-3 weeks.  Call your gastroenterologist if you have not heard about the biopsies in 3 weeks.  Our staff will call the home number listed on your records the next business day following your procedure to check on you and address any questions or concerns that you may have at that time regarding the information given to you following your procedure. This is a courtesy call and so if there is no answer at the home number and we have not heard from you through the emergency physician on call, we will assume that you have returned to your regular daily activities without incident.  SIGNATURES/CONFIDENTIALITY: You and/or your care partner have signed paperwork which will be entered into your electronic medical record.  These signatures attest to the fact that that the information above on your After Visit Summary has been reviewed and is understood.  Full responsibility of the confidentiality  of this discharge information lies with you and/or your care-partner.      Information on polyps,diverticulosis, & high fiber diet given to you today

## 2012-07-09 NOTE — Op Note (Signed)
[facility] [address] [place], [address]   COLONOSCOPY PROCEDURE REPORT  PATIENT: Caroline Peters, Caroline Peters  MR#: [id number] BIRTHDATE: [birth year] , 65  yrs. old GENDER: Female ENDOSCOPIST: Mardella Layman, MD, [facility] REFERRED BY: PROCEDURE DATE:  07/09/2012 PROCEDURE:   Colonoscopy with snare polypectomy ASA CLASS:   Class II INDICATIONS:patient's personal history of colon polyps. MEDICATIONS: propofol (Diprivan) 300mg  IV  DESCRIPTION OF PROCEDURE:   After the risks and benefits and of the procedure were explained, informed consent was obtained.  A digital rectal exam revealed no abnormalities of the rectum.    The LB [id number]  endoscope was introduced through the anus and advanced to the cecum, which was identified by both the appendix and ileocecal valve .  The quality of the prep was good, using MoviPrep .  The instrument was then slowly withdrawn as the colon was fully examined.     COLON FINDINGS: There was moderate diverticulosis noted in the descending colon and sigmoid colon with associated tortuosity.   A polypoid shaped pedunculated polyp was found in the sigmoid colon. A polypectomy was performed using snare cautery.  The resection was complete and the polyp tissue was completely retrieved. Retroflexed views revealed no abnormalities.     The scope was then withdrawn from the patient and the procedure completed.  COMPLICATIONS: There were no complications. ENDOSCOPIC IMPRESSION: 1.   There was moderate diverticulosis noted in the descending colon and sigmoid colon 2.   Pedunculated polyp was found in the sigmoid colon; polypectomy was performed using snare cautery  RECOMMENDATIONS: 1.  Await pathology results 2.  High fiber diet 3.  Repeat Colonoscopy in 3 years.   REPEAT EXAM:  ON:GEXBM Romilda Garret, MD  _______________________________ eSigned:  Mardella Layman, MD, [facility] 07/09/2012 11:07 AM

## 2012-07-10 ENCOUNTER — Telehealth: Payer: Self-pay

## 2012-07-10 NOTE — Telephone Encounter (Signed)
  Follow up Call-  Call back number 07/09/2012  Post procedure Call Back phone  # [phone]  Permission to leave phone message Yes     Patient questions:  Do you have a fever, pain , or abdominal swelling? no Pain Score  0 *  Have you tolerated food without any problems?yes  Have you been able to return to your normal activities? no  Do you have any questions about your discharge instructions: Diet   no Medications  no Follow up visit  no  Do you have questions or concerns about your Care? no  Actions: * If pain score is 4 or above: No action needed, pain <4.

## 2012-07-15 ENCOUNTER — Encounter: Payer: Self-pay | Admitting: Gastroenterology

## 2012-09-04 ENCOUNTER — Telehealth: Payer: Self-pay | Admitting: *Deleted

## 2012-09-04 NOTE — Telephone Encounter (Signed)
Pt has taken zoloft in the past and it worked well.  Dr Cato Mulligan changed it to paxil and she has been off of it since Thanksgiving.  Pt stated that she met with her psychologist and he wants her back on the zoloft.  Can we call a new rx?

## 2012-09-06 NOTE — Telephone Encounter (Signed)
Call in zoloft 50 mg po qd, #90/ 3 refills

## 2012-09-08 MED ORDER — SERTRALINE HCL 50 MG PO TABS: 50.0000 mg | ORAL_TABLET | Freq: Every day | ORAL | Status: DC

## 2012-09-08 NOTE — Telephone Encounter (Signed)
rx sent in electronically to [facility], pt aware 

## 2012-10-01 ENCOUNTER — Telehealth: Payer: Self-pay | Admitting: *Deleted

## 2012-10-01 MED ORDER — TEMAZEPAM 15 MG PO CAPS: 15.0000 mg | ORAL_CAPSULE | Freq: Every evening | ORAL | Status: DC | PRN

## 2012-10-01 NOTE — Telephone Encounter (Signed)
ok 

## 2012-10-01 NOTE — Telephone Encounter (Signed)
Pt stated that the sertraline is not working and she would like to increase her dose to 100 mg.  She is currently take 50 mg.  Please advise

## 2012-10-02 MED ORDER — SERTRALINE HCL 100 MG PO TABS: 100.0000 mg | ORAL_TABLET | Freq: Every day | ORAL | Status: DC

## 2012-10-02 NOTE — Telephone Encounter (Signed)
Pt aware, rx sent in electronically 

## 2012-10-28 ENCOUNTER — Other Ambulatory Visit: Payer: Self-pay | Admitting: *Deleted

## 2012-10-28 MED ORDER — ALPRAZOLAM 0.5 MG PO TABS: ORAL_TABLET | ORAL | Status: DC

## 2012-12-19 ENCOUNTER — Other Ambulatory Visit: Payer: Self-pay | Admitting: Internal Medicine

## 2012-12-27 ENCOUNTER — Other Ambulatory Visit: Payer: Self-pay | Admitting: Internal Medicine

## 2012-12-30 ENCOUNTER — Other Ambulatory Visit: Payer: Self-pay | Admitting: Internal Medicine

## 2013-01-07 ENCOUNTER — Other Ambulatory Visit: Payer: Self-pay | Admitting: *Deleted

## 2013-01-07 MED ORDER — TEMAZEPAM 15 MG PO CAPS: 15.0000 mg | ORAL_CAPSULE | Freq: Every evening | ORAL | Status: DC | PRN

## 2013-01-19 ENCOUNTER — Telehealth: Payer: Self-pay | Admitting: Internal Medicine

## 2013-01-19 MED ORDER — ROSUVASTATIN CALCIUM 10 MG PO TABS: 10.0000 mg | ORAL_TABLET | Freq: Every day | ORAL | Status: DC

## 2013-01-19 MED ORDER — ALPRAZOLAM 0.5 MG PO TABS: ORAL_TABLET | ORAL | Status: DC

## 2013-01-19 MED ORDER — LEVOTHYROXINE SODIUM 112 MCG PO TABS: ORAL_TABLET | ORAL | Status: DC

## 2013-01-19 MED ORDER — SERTRALINE HCL 100 MG PO TABS: 100.0000 mg | ORAL_TABLET | Freq: Every day | ORAL | Status: DC

## 2013-01-19 MED ORDER — TEMAZEPAM 15 MG PO CAPS: 15.0000 mg | ORAL_CAPSULE | Freq: Every evening | ORAL | Status: DC | PRN

## 2013-01-19 NOTE — Telephone Encounter (Signed)
PT called and requested a refill of the following medications; ALPRAZolam (XANAX) 0.5 MG tablet, levothyroxine (SYNTHROID, LEVOTHROID) 112 MCG tablet, rosuvastatin (CRESTOR) 10 MG tablet, sertraline (ZOLOFT) 100 MG tablet, and  temazepam (RESTORIL) 15 MG capsule. She would like them called into [facility]. Please assist.

## 2013-01-19 NOTE — Telephone Encounter (Signed)
Pt has OV in July.  Given enough refills till appt.  Ok per Dr Cato Mulligan

## 2013-01-23 ENCOUNTER — Ambulatory Visit: Payer: Medicare Other | Admitting: Internal Medicine

## 2013-02-11 ENCOUNTER — Encounter: Payer: Self-pay | Admitting: Gynecology

## 2013-03-03 ENCOUNTER — Ambulatory Visit (INDEPENDENT_AMBULATORY_CARE_PROVIDER_SITE_OTHER): Payer: Medicare Other | Admitting: Internal Medicine

## 2013-03-03 ENCOUNTER — Encounter: Payer: Self-pay | Admitting: Internal Medicine

## 2013-03-03 VITALS — BP 138/86 | HR 72 | Temp 98.4°F | Wt 147.0 lb

## 2013-03-03 DIAGNOSIS — C801 Malignant (primary) neoplasm, unspecified: Secondary | ICD-10-CM

## 2013-03-03 DIAGNOSIS — E039 Hypothyroidism, unspecified: Secondary | ICD-10-CM

## 2013-03-03 DIAGNOSIS — E785 Hyperlipidemia, unspecified: Secondary | ICD-10-CM

## 2013-03-03 LAB — CBC WITH DIFFERENTIAL/PLATELET
Basophils Absolute: 0 10*3/uL (ref 0.0–0.1)
HCT: 39.4 % (ref 36.0–46.0)
Hemoglobin: 13.1 g/dL (ref 12.0–15.0)
Lymphs Abs: 0.8 10*3/uL (ref 0.7–4.0)
MCV: 92.8 fl (ref 78.0–100.0)
Monocytes Absolute: 0.4 10*3/uL (ref 0.1–1.0)
Monocytes Relative: 9 % (ref 3.0–12.0)
Neutro Abs: 2.9 10*3/uL (ref 1.4–7.7)
Platelets: 186 10*3/uL (ref 150.0–400.0)
RDW: 14.3 % (ref 11.5–14.6)

## 2013-03-03 LAB — BASIC METABOLIC PANEL
BUN: 13 mg/dL (ref 6–23)
CO2: 28 mEq/L (ref 19–32)
GFR: 84.37 mL/min (ref >60.00)
Glucose, Bld: 93 mg/dL (ref 70–99)
Potassium: 5 mEq/L (ref 3.5–5.1)
Sodium: 142 mEq/L (ref 135–145)

## 2013-03-03 LAB — HEPATIC FUNCTION PANEL
ALT: 31 U/L (ref 0–35)
Albumin: 4.5 g/dL (ref 3.5–5.2)
Bilirubin, Direct: 0.1 mg/dL (ref 0.0–0.3)
Total Protein: 7.2 g/dL (ref 6.0–8.3)

## 2013-03-03 LAB — LIPID PANEL
Cholesterol: 207 mg/dL — ABNORMAL HIGH (ref 0–200)
Total CHOL/HDL Ratio: 2
Triglycerides: 56 mg/dL (ref 0.0–149.0)

## 2013-03-03 LAB — TSH: TSH: 0.43 u[IU]/mL (ref 0.35–5.50)

## 2013-03-03 NOTE — Assessment & Plan Note (Signed)
No recurrence. 

## 2013-03-03 NOTE — Assessment & Plan Note (Signed)
Continue meds Check labs today 

## 2013-03-03 NOTE — Progress Notes (Signed)
Patient ID: Caroline Peters, female   DOB: 09/02/1946, 67 y.o.   MRN: [id number]  Anxiety much better  Lipids- tolerating meds  Melanoma-- sees dermatology  Past Medical History  Diagnosis Date  . DEPRESSION 04/16/2007  . HYPOTHYROIDISM 04/16/2007  . Endometrium, polyp   . Osteopenia   . Hypothyroid   . 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 Cancer     Melanoma    History   Social History  . Marital Status: Married    Spouse Name: N/A    Number of Children: N/A  . Years of Education: N/A   Occupational History  . Not on file.   Social History Main Topics  . Smoking status: Never Smoker   . Smokeless tobacco: Never Used  . Alcohol Use: 7.0 oz/week    14 drink(s) per week  . Drug Use: No  . Sexually Active: Yes    Birth Control/ Protection: Post-menopausal   Other Topics Concern  . Not on file   Social History Narrative  . No narrative on file    Past Surgical History  Procedure Laterality Date  . Melanoma excision    . Excision of melanoma  1978  . Hysteroscopy  1988    HYSTEROSCOPY,D&C  . Breast surgery      bx left breast    Family History  Problem Relation Age of Onset  . Diabetes Mother   . Heart disease Mother   . Dementia Mother   . Diabetes Father   . Heart disease Father   . Cancer Father     Skin cancer  . Breast cancer Maternal Grandmother     Age 16  . Stomach cancer Neg Hx   . Colon cancer Neg Hx     Allergies  Allergen Reactions  . Compazine     extrapyramidal syndrome    Current Outpatient Prescriptions on File Prior to Visit  Medication Sig Dispense Refill  . ALPRAZolam (XANAX) 0.5 MG tablet Half to one tab bid prn  30 tablet  0  . Calcium Carbonate-Vitamin D (CALTRATE 600+D) 600-400 MG-UNIT per tablet Take 2 tablets by mouth daily.        Marland Kitchen levothyroxine (SYNTHROID, LEVOTHROID) 112 MCG tablet TAKE 1 TABLET ONCE DAILY. NEEDS OV  90 tablet  0  . rosuvastatin (CRESTOR) 10 MG tablet Take 1 tablet (10 mg total) by mouth daily.   30 tablet  1  . sertraline (ZOLOFT) 100 MG tablet Take 1 tablet (100 mg total) by mouth daily.  90 tablet  0  . temazepam (RESTORIL) 15 MG capsule Take 1 capsule (15 mg total) by mouth at bedtime as needed for sleep.  30 capsule  1   No current facility-administered medications on file prior to visit.     patient denies chest pain, shortness of breath, orthopnea. Denies lower extremity edema, abdominal pain, change in appetite, change in bowel movements. Patient denies rashes, musculoskeletal complaints. No other specific complaints in a complete review of systems.   BP 160/84  Pulse 72  Temp(Src) 98.4 F (36.9 C) (Oral)  Wt 147 lb (66.679 kg)  BMI 26.88 kg/m2   Well-developed well-nourished female in no acute distress. HEENT exam atraumatic, normocephalic, extraocular muscles are intact. Neck is supple. No jugular venous distention no thyromegaly. Chest clear to auscultation without increased work of breathing. Cardiac exam S1 and S2 are regular. Abdominal exam active bowel sounds, soft, nontender. Extremities no edema. Neurologic exam she is alert without any motor  sensory deficits. Gait is normal. Repeat bp 138/86

## 2013-03-03 NOTE — Assessment & Plan Note (Signed)
Check tsh 

## 2013-03-09 ENCOUNTER — Telehealth: Payer: Self-pay | Admitting: *Deleted

## 2013-03-09 NOTE — Telephone Encounter (Signed)
Pt called and said that the citalopram was not working.  She is taking 100 mg and decided to increase to 1 1/2 tablet today. Also states the xanax is not working.  She wanted to know after taking the 1 1/2 tabs of citalopram if that was ok.  Instructed pt to not increase dose without the advice of a physician.  Routed to Dr Cato Mulligan for advice

## 2013-03-10 NOTE — Telephone Encounter (Signed)
Correction, its Zoloft not citalopram.  Per Dr Cato Mulligan ok to increase to 150 mg.  Take same dose of xanax and wait a month to see if the zoloft increase helps.  Pt aware.  She states that she is not going to take the xanax and will wait and see if the 1 1/2 tabs help.

## 2013-03-31 ENCOUNTER — Other Ambulatory Visit: Payer: Self-pay | Admitting: Internal Medicine

## 2013-04-23 ENCOUNTER — Other Ambulatory Visit: Payer: Self-pay | Admitting: *Deleted

## 2013-04-23 MED ORDER — TEMAZEPAM 15 MG PO CAPS: 15.0000 mg | ORAL_CAPSULE | Freq: Every evening | ORAL | Status: DC | PRN

## 2013-05-02 ENCOUNTER — Other Ambulatory Visit: Payer: Self-pay | Admitting: Internal Medicine

## 2013-05-05 ENCOUNTER — Other Ambulatory Visit: Payer: Self-pay | Admitting: *Deleted

## 2013-05-05 MED ORDER — SERTRALINE HCL 100 MG PO TABS: 150.0000 mg | ORAL_TABLET | Freq: Every day | ORAL | Status: DC

## 2013-06-03 DIAGNOSIS — M8580 Other specified disorders of bone density and structure, unspecified site: Secondary | ICD-10-CM

## 2013-06-03 HISTORY — DX: Other specified disorders of bone density and structure, unspecified site: M85.80

## 2013-06-04 ENCOUNTER — Encounter: Payer: Medicare Other | Admitting: Gynecology

## 2013-06-09 ENCOUNTER — Ambulatory Visit (INDEPENDENT_AMBULATORY_CARE_PROVIDER_SITE_OTHER): Payer: Medicare Other | Admitting: Gynecology

## 2013-06-09 ENCOUNTER — Encounter: Payer: Self-pay | Admitting: Gynecology

## 2013-06-09 VITALS — BP 120/78 | Ht 62.5 in | Wt 145.0 lb

## 2013-06-09 DIAGNOSIS — E039 Hypothyroidism, unspecified: Secondary | ICD-10-CM

## 2013-06-09 DIAGNOSIS — D251 Intramural leiomyoma of uterus: Secondary | ICD-10-CM

## 2013-06-09 DIAGNOSIS — M8580 Other specified disorders of bone density and structure, unspecified site: Secondary | ICD-10-CM

## 2013-06-09 DIAGNOSIS — M899 Disorder of bone, unspecified: Secondary | ICD-10-CM

## 2013-06-09 DIAGNOSIS — N952 Postmenopausal atrophic vaginitis: Secondary | ICD-10-CM

## 2013-06-09 NOTE — Patient Instructions (Signed)
Followup for bone density as scheduled. Followup if any issues otherwise in one year for annual exam.

## 2013-06-09 NOTE — Progress Notes (Addendum)
Caroline Peters 03-28-1946 [id number]        67 y.o.  [id number] for followup exam.  Former patient of Dr. Eda Paschal. Several issues noted below.  Past medical history,surgical history, medications, allergies, family history and social history were all reviewed and documented in the EPIC chart.  ROS:  Performed and pertinent positives and negatives are included in the history, assessment and plan .  Exam: Kim assistant Filed Vitals:   06/09/13 1052  BP: 120/78  Height: 5' 2.5" (1.588 m)  Weight: 145 lb (65.772 kg)   General appearance  Normal Skin grossly normal Head/Neck normal with no cervical or supraclavicular adenopathy thyroid normal Lungs  clear Cardiac RR, without RMG Abdominal  soft, nontender, without masses, organomegaly or hernia Breasts  examined lying and sitting without masses, retractions, discharge or axillary adenopathy. Pelvic  Ext/BUS/vagina  normal with atrophic changes  Cervix  normal with atrophic changes  Uterus  axial to anteverted, normal size, shape and contour, midline and mobile nontender   Adnexa  Without masses or tenderness    Anus and perineum  normal   Rectovaginal  normal sphincter tone without palpated masses or tenderness.    Assessment/Plan:  67 y.o. [name] female for annual exam.   1. Postmenopausal/atrophic genital changes. Patient without significant symptoms such as hot flashes, night sweats, vaginal dryness, dyspareunia. No bleeding. Will continue to monitor. Patient knows to report any vaginal bleeding. 2. Osteopenia. DEXA 06/2011 T score -1.9 FRAX 8.7%/1.1%. Plan repeat now she agrees to do so. Increase calcium vitamin D reviewed. 3. Leiomyoma. Patient has ultrasound 12/2011 which shows multiple small myomas largest measuring 23 mm. Exam today shows normal uterine size. Continue to monitor with annual exams. 4. Mammography 02/2013. Continue with annual mammography. SBE monthly reviewed. 5. Pap smear 2012. No Pap smear done today. No  history of abnormal Pap smears. Review current screening guidelines and options to stop altogether or less frequent screening intervals reviewed. We'll readdress on an annual basis. 6. Colonoscopy 2013. Repeat at their recommended interval. 7. Health maintenance. Patient's follow for hypothyroidism. Has her routine blood work done through her primary physician's office. Followup for DEXA otherwise 1 year, sooner as needed  Note: This document was prepared with digital dictation and possible smart phrase technology. Any transcriptional errors that result from this process are unintentional.   Dara Lords MD, 11:21 AM 06/09/2013

## 2013-06-10 LAB — URINALYSIS W MICROSCOPIC + REFLEX CULTURE
Bacteria, UA: NONE SEEN
Bilirubin Urine: NEGATIVE
Casts: NONE SEEN
Crystals: NONE SEEN
Hgb urine dipstick: NEGATIVE
Ketones, ur: NEGATIVE mg/dL
Specific Gravity, Urine: 1.022 (ref 1.005–1.030)
Urobilinogen, UA: 0.2 mg/dL (ref 0.0–1.0)
pH: 6 (ref 5.0–8.0)

## 2013-06-16 ENCOUNTER — Ambulatory Visit (INDEPENDENT_AMBULATORY_CARE_PROVIDER_SITE_OTHER): Payer: Medicare Other

## 2013-06-16 DIAGNOSIS — M899 Disorder of bone, unspecified: Secondary | ICD-10-CM

## 2013-06-16 DIAGNOSIS — M8580 Other specified disorders of bone density and structure, unspecified site: Secondary | ICD-10-CM

## 2013-06-18 ENCOUNTER — Encounter: Payer: Self-pay | Admitting: Gynecology

## 2013-06-22 ENCOUNTER — Other Ambulatory Visit: Payer: Self-pay | Admitting: Internal Medicine

## 2013-06-30 ENCOUNTER — Ambulatory Visit (INDEPENDENT_AMBULATORY_CARE_PROVIDER_SITE_OTHER): Payer: Medicare Other

## 2013-06-30 DIAGNOSIS — Z23 Encounter for immunization: Secondary | ICD-10-CM

## 2013-09-15 ENCOUNTER — Telehealth: Payer: Self-pay | Admitting: Internal Medicine

## 2013-09-15 MED ORDER — TEMAZEPAM 15 MG PO CAPS: 15.0000 mg | ORAL_CAPSULE | Freq: Every evening | ORAL | Status: DC | PRN

## 2013-09-15 NOTE — Telephone Encounter (Signed)
Error the medication is temazepam 15 mg qhs.  Ok per Dr Leanne Chang x3 , rx called in

## 2013-09-15 NOTE — Telephone Encounter (Signed)
[facility] requesting refill of lisinopril (PRINIVIL,ZESTRIL) 40 MG tablet, last filled 08/06/13

## 2013-09-30 ENCOUNTER — Other Ambulatory Visit: Payer: Self-pay | Admitting: Internal Medicine

## 2013-10-22 ENCOUNTER — Encounter: Payer: Self-pay | Admitting: Internal Medicine

## 2013-10-22 ENCOUNTER — Ambulatory Visit (INDEPENDENT_AMBULATORY_CARE_PROVIDER_SITE_OTHER): Payer: Medicare Other | Admitting: Internal Medicine

## 2013-10-22 VITALS — BP 150/80 | HR 73 | Temp 97.5°F | Resp 20 | Ht 62.5 in | Wt 149.0 lb

## 2013-10-22 DIAGNOSIS — R55 Syncope and collapse: Secondary | ICD-10-CM

## 2013-10-22 DIAGNOSIS — E039 Hypothyroidism, unspecified: Secondary | ICD-10-CM

## 2013-10-22 DIAGNOSIS — F329 Major depressive disorder, single episode, unspecified: Secondary | ICD-10-CM

## 2013-10-22 DIAGNOSIS — F3289 Other specified depressive episodes: Secondary | ICD-10-CM

## 2013-10-22 LAB — COMPREHENSIVE METABOLIC PANEL
ALT: 31 U/L (ref 0–35)
AST: 24 U/L (ref 0–37)
Albumin: 4.7 g/dL (ref 3.5–5.2)
Alkaline Phosphatase: 60 U/L (ref 39–117)
BUN: 19 mg/dL (ref 6–23)
CHLORIDE: 105 meq/L (ref 96–112)
CO2: 26 meq/L (ref 19–32)
Calcium: 10.9 mg/dL — ABNORMAL HIGH (ref 8.4–10.5)
Creatinine, Ser: 0.8 mg/dL (ref 0.4–1.2)
GFR: 75.77 mL/min (ref >60.00)
Glucose, Bld: 73 mg/dL (ref 70–99)
POTASSIUM: 4.6 meq/L (ref 3.5–5.1)
Sodium: 142 mEq/L (ref 135–145)
TOTAL PROTEIN: 7.6 g/dL (ref 6.0–8.3)
Total Bilirubin: 0.4 mg/dL (ref 0.3–1.2)

## 2013-10-22 LAB — CBC WITH DIFFERENTIAL/PLATELET
BASOS ABS: 0 10*3/uL (ref 0.0–0.1)
BASOS PCT: 0.9 % (ref 0.0–3.0)
EOS ABS: 0.3 10*3/uL (ref 0.0–0.7)
Eosinophils Relative: 4.4 % (ref 0.0–5.0)
HCT: 42.3 % (ref 36.0–46.0)
Hemoglobin: 13.8 g/dL (ref 12.0–15.0)
Lymphocytes Relative: 16.1 % (ref 12.0–46.0)
Lymphs Abs: 0.9 10*3/uL (ref 0.7–4.0)
MCHC: 32.5 g/dL (ref 30.0–36.0)
MCV: 93.4 fl (ref 78.0–100.0)
Monocytes Absolute: 0.4 10*3/uL (ref 0.1–1.0)
Monocytes Relative: 7.3 % (ref 3.0–12.0)
NEUTROS PCT: 71.3 % (ref 43.0–77.0)
Neutro Abs: 4.1 10*3/uL (ref 1.4–7.7)
Platelets: 225 10*3/uL (ref 150.0–400.0)
RBC: 4.53 Mil/uL (ref 3.87–5.11)
RDW: 14.4 % (ref 11.5–14.6)
WBC: 5.8 10*3/uL (ref 4.5–10.5)

## 2013-10-22 LAB — SEDIMENTATION RATE: Sed Rate: 9 mm/hr (ref 0–22)

## 2013-10-22 LAB — T3, FREE: T3 FREE: 2.5 pg/mL (ref 2.3–4.2)

## 2013-10-22 LAB — T4, FREE: Free T4: 0.81 ng/dL (ref 0.60–1.60)

## 2013-10-22 LAB — TSH: TSH: 2.62 u[IU]/mL (ref 0.35–5.50)

## 2013-10-22 NOTE — Progress Notes (Signed)
Subjective:    Patient ID: Caroline Peters, female    DOB: [birth year], 68 y.o.   MRN: [id number]  HPI  68 year old patient who is seen today with concerns about thyroid dysfunction.  She has been on supplemental levothyroxine, but over the past 6 weeks has noted increasing fatigue, hair loss, and general sense of unwellness.  5 days ago the patient had a near-syncopal episode while standing in her kitchen and she sat down to avoid falling.  There is no loss of consciousness.  She also has complaints of right-sided headache.  She also describes some brief visual disturbances involving her right eye only when she first awakes in the morning.  She does have a remote history of melanoma involving the left arm that days ago.  She has been compliant with her medication  Past Medical History  Diagnosis Date  . DEPRESSION 04/16/2007  . HYPOTHYROIDISM 04/16/2007  . Osteopenia 06/2013    T score -2.0 FRAX 8.8%/0.8% stable from prior DEXA 2012  . Hypothyroid   . Cancer     Melanoma    History   Social History  . Marital Status: Married    Spouse Name: N/A    Number of Children: N/A  . Years of Education: N/A   Occupational History  . Not on file.   Social History Main Topics  . Smoking status: Never Smoker   . Smokeless tobacco: Never Used  . Alcohol Use: 7.0 oz/week    14 drink(s) per week  . Drug Use: No  . Sexual Activity: Yes    Birth Control/ Protection: Post-menopausal   Other Topics Concern  . Not on file   Social History Narrative  . No narrative on file    Past Surgical History  Procedure Laterality Date  . Excision of melanoma  1978  . Hysteroscopy  1988    HYSTEROSCOPY,D&C  . Breast surgery      bx left breast    Family History  Problem Relation Age of Onset  . Diabetes Mother   . Heart disease Mother   . Dementia Mother   . Diabetes Father   . Heart disease Father   . Cancer Father     Skin cancer  . Breast cancer Maternal Grandmother     Age 27  .  Stomach cancer Neg Hx   . Colon cancer Neg Hx     Allergies  Allergen Reactions  . Compazine     extrapyramidal syndrome    Current Outpatient Prescriptions on File Prior to Visit  Medication Sig Dispense Refill  . Calcium Carbonate-Vitamin D (CALTRATE 600+D) 600-400 MG-UNIT per tablet Take 2 tablets by mouth daily.        . CRESTOR 10 MG tablet TAKE 1 TABLET ONCE DAILY.  30 tablet  5  . levothyroxine (SYNTHROID, LEVOTHROID) 112 MCG tablet TAKE 1 TABLET ONCE DAILY.  90 tablet  1  . sertraline (ZOLOFT) 100 MG tablet Take 1.5 tablets (150 mg total) by mouth daily.  135 tablet  3  . temazepam (RESTORIL) 15 MG capsule Take 1 capsule (15 mg total) by mouth at bedtime as needed for sleep.  30 capsule  3   No current facility-administered medications on file prior to visit.    BP 150/80  Pulse 73  Temp(Src) 97.5 F (36.4 C) (Oral)  Resp 20  Ht 5' 2.5" (1.588 m)  Wt 149 lb (67.586 kg)  BMI 26.80 kg/m2  SpO2 95%       Review  of Systems  Constitutional: Positive for activity change, appetite change and fatigue.  HENT: Negative for congestion, dental problem, hearing loss, rhinorrhea, sinus pressure, sore throat and tinnitus.   Eyes: Negative for pain, discharge and visual disturbance.  Respiratory: Negative for cough and shortness of breath.   Cardiovascular: Negative for chest pain, palpitations and leg swelling.  Gastrointestinal: Negative for nausea, vomiting, abdominal pain, diarrhea, constipation, blood in stool and abdominal distention.  Genitourinary: Negative for dysuria, urgency, frequency, hematuria, flank pain, vaginal bleeding, vaginal discharge, difficulty urinating, vaginal pain and pelvic pain.  Musculoskeletal: Negative for arthralgias, gait problem and joint swelling.  Skin: Negative for rash.        Hair loss   Neurological: Positive for headaches. Negative for dizziness, syncope, speech difficulty, weakness and numbness.  Hematological: Negative for  adenopathy.  Psychiatric/Behavioral: Negative for behavioral problems, dysphoric mood and agitation. The patient is not nervous/anxious.        Objective:   Physical Exam  Constitutional: She is oriented to person, place, and time. She appears well-developed and well-nourished. No distress.  HENT:  Head: Normocephalic.  Right Ear: External ear normal.  Left Ear: External ear normal.  Mouth/Throat: Oropharynx is clear and moist.  Eyes: Conjunctivae and EOM are normal. Pupils are equal, round, and reactive to light.  Neck: Normal range of motion. Neck supple. No thyromegaly present.  Cardiovascular: Normal rate, regular rhythm, normal heart sounds and intact distal pulses.   Pulmonary/Chest: Effort normal and breath sounds normal.  Abdominal: Soft. Bowel sounds are normal. She exhibits no mass. There is no tenderness.  Musculoskeletal: Normal range of motion.  Lymphadenopathy:    She has no cervical adenopathy.  Neurological: She is alert and oriented to person, place, and time.  Brisk reflexes  Exam nonfocal.  No drift of the outstretched arms Normal finger to nose testing  Skin: Skin is warm and dry. No rash noted.  Scalp appeared normal  Psychiatric: She has a normal mood and affect. Her behavior is normal. Judgment and thought content normal.          Assessment & Plan:   History of fatigue, hair loss Hypothyroidism.  We'll check a TSH and T4 History near syncope.  We'll check electrolytes and CBC Dyslipidemia.  Continue Crestor

## 2013-10-22 NOTE — Progress Notes (Signed)
Pre-visit discussion using our clinic review tool. No additional management support is needed unless otherwise documented below in the visit note.  

## 2013-10-22 NOTE — Patient Instructions (Signed)
It is important that you exercise regularly, at least 20 minutes 3 to 4 times per week.  If you develop chest pain or shortness of breath seek  medical attention.  Return in one month for follow-up

## 2013-12-26 ENCOUNTER — Other Ambulatory Visit: Payer: Self-pay | Admitting: Internal Medicine

## 2014-02-16 ENCOUNTER — Telehealth: Payer: Self-pay | Admitting: Internal Medicine

## 2014-02-16 MED ORDER — TEMAZEPAM 15 MG PO CAPS: 15.0000 mg | ORAL_CAPSULE | Freq: Every evening | ORAL | Status: DC | PRN

## 2014-02-16 NOTE — Telephone Encounter (Signed)
Ok x3 per Dr Leanne Chang, rx called in to [facility]

## 2014-02-16 NOTE — Telephone Encounter (Signed)
[facility], [address] Is requesting re-fill on temazepam (RESTORIL) 15 MG capsule

## 2014-02-18 ENCOUNTER — Encounter: Payer: Self-pay | Admitting: Family Medicine

## 2014-02-18 ENCOUNTER — Ambulatory Visit (INDEPENDENT_AMBULATORY_CARE_PROVIDER_SITE_OTHER): Payer: Medicare Other | Admitting: Family Medicine

## 2014-02-18 VITALS — BP 150/92 | HR 77 | Temp 98.4°F | Ht 62.5 in | Wt 144.0 lb

## 2014-02-18 DIAGNOSIS — R51 Headache: Secondary | ICD-10-CM

## 2014-02-18 MED ORDER — PREDNISONE 10 MG PO TABS: 40.0000 mg | ORAL_TABLET | Freq: Every day | ORAL | Status: DC

## 2014-02-18 NOTE — Progress Notes (Signed)
   Subjective:    Patient ID: Caroline Peters, female    DOB: Oct 09, 1945, 68 y.o.   MRN: [id number]  HPI Here for 5 days of a pain on the right side of the head and in the right ear. No sinus symptoms or URI symptoms. No eye pain and no blurred vision, but the right eye has been watering a lot. No pain on chewing.    Review of Systems  Constitutional: Negative.   HENT: Positive for ear pain. Negative for congestion, dental problem, ear discharge, facial swelling, hearing loss, postnasal drip, rhinorrhea, sinus pressure, sore throat and trouble swallowing.   Eyes: Negative.   Respiratory: Negative.   Skin: Negative for rash.       Objective:   Physical Exam  Constitutional: She appears well-developed and well-nourished. No distress.  HENT:  Head: Normocephalic and atraumatic.  Right Ear: External ear normal.  Left Ear: External ear normal.  Nose: Nose normal.  The skin on the scalp and face is clear. She is quite tender over th right temple and especially the right temporal artery. No swelling. No TMJ tenderness  Eyes: Conjunctivae and EOM are normal. Pupils are equal, round, and reactive to light.  Neck: Neck supple. No thyromegaly present.  Lymphadenopathy:    She has no cervical adenopathy.          Assessment & Plan:  Possible temporal arteritis. Start on Prednisone 40 mg daily. Refer to ENT for a temporal artery biopsy. Get a ESR

## 2014-02-18 NOTE — Progress Notes (Signed)
Pre visit review using our clinic review tool, if applicable. No additional management support is needed unless otherwise documented below in the visit note. 

## 2014-02-19 ENCOUNTER — Telehealth: Payer: Self-pay | Admitting: Family Medicine

## 2014-02-19 NOTE — Telephone Encounter (Signed)
Pt wanting to know if she should take the prednisone through the weekend.  Dr Sarajane Jews out of the office.  Called Dr Leanne Chang and per Dr Leanne Chang she should continue the prednisone.  Pt aware, forwarded note to Dr Leanne Chang so he can call Dr Lucia Gaskins

## 2014-02-19 NOTE — Telephone Encounter (Signed)
Dr. Radene Journey called and he does not do the biopsy in office. He said that he could maybe schedule for Tuesday 10/26/13, he prefers to talk with the doctor that is recommending this. I will forward this note to [facility], she said that she would contact Dr. Leanne Chang.

## 2014-02-22 ENCOUNTER — Other Ambulatory Visit: Payer: Self-pay | Admitting: Otolaryngology

## 2014-02-22 NOTE — H&P (Signed)
PREOPERATIVE H&P  Chief Complaint: headaches on the right side  HPI: Caroline Peters is a 68 y.o. female who presents for evaluation of possible temporal arteritis. She's had right sided HA for more than a week and she saw Dr Leanne Chang who thought she might have temporal arteritis and started her on steroids and recommended biopsy of the temporal artery. She's taken to the OR for right temporal artery biopsy.  Past Medical History  Diagnosis Date  . DEPRESSION 04/16/2007  . HYPOTHYROIDISM 04/16/2007  . Osteopenia 06/2013    T score -2.0 FRAX 8.8%/0.8% stable from prior DEXA 2012  . Hypothyroid   . Cancer     Melanoma   Past Surgical History  Procedure Laterality Date  . Excision of melanoma  1978  . Hysteroscopy  1988    HYSTEROSCOPY,D&C  . Breast surgery      bx left breast   History   Social History  . Marital Status: Married    Spouse Name: N/A    Number of Children: N/A  . Years of Education: N/A   Social History Main Topics  . Smoking status: Never Smoker   . Smokeless tobacco: Never Used  . Alcohol Use: 7.0 oz/week    14 drink(s) per week     Comment: wine daily  . Drug Use: No  . Sexual Activity: Yes    Birth Control/ Protection: Post-menopausal   Other Topics Concern  . Not on file   Social History Narrative  . No narrative on file   Family History  Problem Relation Age of Onset  . Diabetes Mother   . Heart disease Mother   . Dementia Mother   . Diabetes Father   . Heart disease Father   . Cancer Father     Skin cancer  . Breast cancer Maternal Grandmother     Age 33  . Stomach cancer Neg Hx   . Colon cancer Neg Hx    Allergies  Allergen Reactions  . Compazine     extrapyramidal syndrome   Prior to Admission medications   Medication Sig Start Date End Date Taking? Authorizing Provider  Calcium Carbonate-Vitamin D (CALTRATE 600+D) 600-400 MG-UNIT per tablet Take 2 tablets by mouth daily.      Historical Provider, MD  CRESTOR 10 MG tablet TAKE 1  TABLET ONCE DAILY. 09/30/13   Lisabeth Pick, MD  levothyroxine (SYNTHROID, LEVOTHROID) 112 MCG tablet TAKE 1 TABLET ONCE DAILY.    Lisabeth Pick, MD  predniSONE (DELTASONE) 10 MG tablet Take 4 tablets (40 mg total) by mouth daily with breakfast. 02/18/14   Laurey Morale, MD  sertraline (ZOLOFT) 100 MG tablet Take 100 mg by mouth daily. 05/05/13   Lisabeth Pick, MD  temazepam (RESTORIL) 15 MG capsule Take 1 capsule (15 mg total) by mouth at bedtime as needed for sleep. 02/16/14   Lisabeth Pick, MD     Positive ROS: headache has gotten better since starting steroids  All other systems have been reviewed and were otherwise negative with the exception of those mentioned in the HPI and as above.  Physical Exam: There were no vitals filed for this visit.  General: Alert, no acute distress Oral: Normal oral mucosa and tonsils Nasal: Clear nasal passages Neck: No palpable adenopathy or thyroid nodules Ear: Ear canal is clear with normal appearing TMs Cardiovascular: Regular rate and rhythm, no murmur.  Respiratory: Clear to auscultation Neurologic: Alert and oriented x 3   Assessment/Plan: HEADACHE Plan for  Procedure(s): BIOPSY TEMPORAL ARTERY RIGHT   Melony Overly, MD 02/22/2014 2:06 PM

## 2014-02-23 ENCOUNTER — Encounter (HOSPITAL_BASED_OUTPATIENT_CLINIC_OR_DEPARTMENT_OTHER)
Admission: RE | Disposition: A | Discharge status: Home / Self Care | Payer: Self-pay | Source: Ambulatory Visit | Attending: Otolaryngology

## 2014-02-23 ENCOUNTER — Ambulatory Visit (HOSPITAL_BASED_OUTPATIENT_CLINIC_OR_DEPARTMENT_OTHER)
Admission: RE | Admit: 2014-02-23 | Discharge: 2014-02-23 | Disposition: A | Discharge status: Home / Self Care | Payer: Medicare Other | Source: Ambulatory Visit | Attending: Otolaryngology | Admitting: Otolaryngology

## 2014-02-23 ENCOUNTER — Telehealth: Payer: Self-pay | Admitting: Internal Medicine

## 2014-02-23 ENCOUNTER — Encounter: Payer: Self-pay | Admitting: Gynecology

## 2014-02-23 ENCOUNTER — Encounter (HOSPITAL_BASED_OUTPATIENT_CLINIC_OR_DEPARTMENT_OTHER): Payer: Self-pay

## 2014-02-23 DIAGNOSIS — R51 Headache: Secondary | ICD-10-CM | POA: Insufficient documentation

## 2014-02-23 DIAGNOSIS — E039 Hypothyroidism, unspecified: Secondary | ICD-10-CM | POA: Insufficient documentation

## 2014-02-23 DIAGNOSIS — Z85820 Personal history of malignant melanoma of skin: Secondary | ICD-10-CM | POA: Insufficient documentation

## 2014-02-23 DIAGNOSIS — Z79899 Other long term (current) drug therapy: Secondary | ICD-10-CM | POA: Insufficient documentation

## 2014-02-23 DIAGNOSIS — M949 Disorder of cartilage, unspecified: Secondary | ICD-10-CM

## 2014-02-23 DIAGNOSIS — M899 Disorder of bone, unspecified: Secondary | ICD-10-CM | POA: Insufficient documentation

## 2014-02-23 HISTORY — PX: MASS EXCISION: SHX2000

## 2014-02-23 SURGERY — MINOR EXCISION OF MASS
Anesthesia: LOCAL | Laterality: Right

## 2014-02-23 MED ORDER — LIDOCAINE-EPINEPHRINE 1 %-1:100000 IJ SOLN
INTRAMUSCULAR | Status: AC
  Filled 2014-02-23: qty 1

## 2014-02-23 MED ORDER — BACITRACIN ZINC 500 UNIT/GM EX OINT
TOPICAL_OINTMENT | CUTANEOUS | Status: AC
  Filled 2014-02-23: qty 0.9

## 2014-02-23 MED ORDER — CEPHALEXIN 500 MG PO CAPS: 500.0000 mg | ORAL_CAPSULE | Freq: Two times a day (BID) | ORAL | Status: DC

## 2014-02-23 MED ORDER — LIDOCAINE-EPINEPHRINE 1 %-1:100000 IJ SOLN
INTRAMUSCULAR | Status: DC | PRN
  Administered 2014-02-23: 1.5 mL

## 2014-02-23 SURGICAL SUPPLY — 59 items
ADH SKN CLS APL DERMABOND .7 (GAUZE/BANDAGES/DRESSINGS) ×1
APL SKNCLS STERI-STRIP NONHPOA (GAUZE/BANDAGES/DRESSINGS)
APPLICATOR COTTON TIP 6IN STRL (MISCELLANEOUS) ×1 IMPLANT
BALL CTTN LRG ABS STRL LF (GAUZE/BANDAGES/DRESSINGS)
BANDAGE ADH SHEER 1  50/CT (GAUZE/BANDAGES/DRESSINGS) IMPLANT
BENZOIN TINCTURE PRP APPL 2/3 (GAUZE/BANDAGES/DRESSINGS) IMPLANT
BLADE SURG 15 STRL LF DISP TIS (BLADE) ×1 IMPLANT
BLADE SURG 15 STRL SS (BLADE) ×2
CANISTER SUCT 1200ML W/VALVE (MISCELLANEOUS) ×1 IMPLANT
CAUTERY EYE LOW TEMP 1300F FIN (OPHTHALMIC RELATED) IMPLANT
CLEANER CAUTERY TIP 5X5 PAD (MISCELLANEOUS) IMPLANT
COTTONBALL LRG STERILE PKG (GAUZE/BANDAGES/DRESSINGS) IMPLANT
DECANTER SPIKE VIAL GLASS SM (MISCELLANEOUS) IMPLANT
DERMABOND ADVANCED (GAUZE/BANDAGES/DRESSINGS) ×1
DERMABOND ADVANCED .7 DNX12 (GAUZE/BANDAGES/DRESSINGS) IMPLANT
DRSG TEGADERM 4X4.75 (GAUZE/BANDAGES/DRESSINGS) IMPLANT
ELECT COATED BLADE 2.86 ST (ELECTRODE) ×2 IMPLANT
ELECT REM PT RETURN 9FT ADLT (ELECTROSURGICAL) ×2
ELECTRODE REM PT RTRN 9FT ADLT (ELECTROSURGICAL) ×1 IMPLANT
GAUZE SPONGE 4X4 16PLY XRAY LF (GAUZE/BANDAGES/DRESSINGS) IMPLANT
GLOVE BIO SURGEON STRL SZ7 (GLOVE) ×1 IMPLANT
GLOVE BIOGEL PI IND STRL 7.0 (GLOVE) IMPLANT
GLOVE BIOGEL PI INDICATOR 7.0 (GLOVE) ×1
GLOVE SS BIOGEL STRL SZ 7.5 (GLOVE) ×1 IMPLANT
GLOVE SUPERSENSE BIOGEL SZ 7.5 (GLOVE) ×1
GLOVE SURG SS PI 7.0 STRL IVOR (GLOVE) ×1 IMPLANT
GOWN STRL REUS W/ TWL LRG LVL3 (GOWN DISPOSABLE) IMPLANT
GOWN STRL REUS W/TWL LRG LVL3 (GOWN DISPOSABLE) ×4
MARKER SKIN DUAL TIP RULER LAB (MISCELLANEOUS) IMPLANT
NEEDLE 27GAX1X1/2 (NEEDLE) ×2 IMPLANT
NS IRRIG 1000ML POUR BTL (IV SOLUTION) ×1 IMPLANT
PACK BASIN DAY SURGERY FS (CUSTOM PROCEDURE TRAY) ×1 IMPLANT
PAD CLEANER CAUTERY TIP 5X5 (MISCELLANEOUS)
PENCIL BUTTON HOLSTER BLD 10FT (ELECTRODE) ×2 IMPLANT
SPONGE GAUZE 4X4 12PLY STER LF (GAUZE/BANDAGES/DRESSINGS) IMPLANT
SPONGE INTESTINAL PEANUT (DISPOSABLE) IMPLANT
STRIP CLOSURE SKIN 1/2X4 (GAUZE/BANDAGES/DRESSINGS) IMPLANT
STRIP CLOSURE SKIN 1/4X4 (GAUZE/BANDAGES/DRESSINGS) IMPLANT
SUCTION FRAZIER TIP 10 FR DISP (SUCTIONS) IMPLANT
SUT CHROMIC 3 0 PS 2 (SUTURE) IMPLANT
SUT CHROMIC 4 0 P 3 18 (SUTURE) ×1 IMPLANT
SUT ETHILON 5 0 P 3 18 (SUTURE)
SUT ETHILON 6 0 P 1 (SUTURE) IMPLANT
SUT NYLON ETHILON 5-0 P-3 1X18 (SUTURE) IMPLANT
SUT PLAIN 5 0 P 3 18 (SUTURE) IMPLANT
SUT SILK 3 0 TIES 17X18 (SUTURE) ×2
SUT SILK 3-0 18XBRD TIE BLK (SUTURE) IMPLANT
SUT SILK 4 0 TIES 17X18 (SUTURE) IMPLANT
SUT VIC AB 4-0 P-3 18XBRD (SUTURE) IMPLANT
SUT VIC AB 4-0 P3 18 (SUTURE) ×2
SWAB COLLECTION DEVICE MRSA (MISCELLANEOUS) IMPLANT
SWABSTICK POVIDONE IODINE SNGL (MISCELLANEOUS) ×4 IMPLANT
SYR BULB 3OZ (MISCELLANEOUS) IMPLANT
SYR CONTROL 10ML LL (SYRINGE) ×2 IMPLANT
TOWEL OR 17X24 6PK STRL BLUE (TOWEL DISPOSABLE) ×2 IMPLANT
TRAY DSU PREP LF (CUSTOM PROCEDURE TRAY) IMPLANT
TUBE ANAEROBIC SPECIMEN COL (MISCELLANEOUS) IMPLANT
TUBE CONNECTING 20X1/4 (TUBING) ×1 IMPLANT
YANKAUER SUCT BULB TIP NO VENT (SUCTIONS) IMPLANT

## 2014-02-23 NOTE — Brief Op Note (Signed)
02/23/2014  9:56 AM  PATIENT:  Caroline Peters  68 y.o. female  PRE-OPERATIVE DIAGNOSIS:  HEADACHE  POST-OPERATIVE DIAGNOSIS:  HEADACHE  PROCEDURE:  Procedure(s): BIOPSY TEMPORAL ARTERY RIGHT (Right)  SURGEON:  Surgeon(s) and Role:    * Rozetta Nunnery, MD - Primary  PHYSICIAN ASSISTANT:   ASSISTANTS: none   ANESTHESIA:   local  EBL:     BLOOD ADMINISTERED:none  DRAINS: none   LOCAL MEDICATIONS USED:  XYLOCAINE with EPI 3 cc  SPECIMEN:  Source of Specimen:  right temporal artery  DISPOSITION OF SPECIMEN:  PATHOLOGY  COUNTS:  YES  TOURNIQUET:  * No tourniquets in log *  DICTATION: .Other Dictation: Dictation Number [id number]  PLAN OF CARE: Discharge to home after PACU  PATIENT DISPOSITION:  PACU - hemodynamically stable.   Delay start of Pharmacological VTE agent (>24hrs) due to surgical blood loss or risk of bleeding: not applicable

## 2014-02-23 NOTE — Interval H&P Note (Signed)
History and Physical Interval Note:  02/23/2014 8:30 AM  Caroline Peters  has presented today for surgery, with the diagnosis of HEADACHE  The various methods of treatment have been discussed with the patient and family. After consideration of risks, benefits and other options for treatment, the patient has consented to  Procedure(s): BIOPSY TEMPORAL ARTERY RIGHT (Right) as a surgical intervention .  The patient's history has been reviewed, patient examined, no change in status, stable for surgery.  I have reviewed the patient's chart and labs.  Questions were answered to the patient's satisfaction.     NEWMAN, CHRISTOPHER

## 2014-02-23 NOTE — Telephone Encounter (Signed)
Wanted to say [facility] told her to call because her bp was high 194/80 after procedure 174/91

## 2014-02-23 NOTE — Discharge Instructions (Addendum)
Tylenol or Motrin prn pain Keflex 500 mg twice per day for 5 days Call office for follow up appt in 6 - 7 days    [phone]

## 2014-02-24 ENCOUNTER — Ambulatory Visit (INDEPENDENT_AMBULATORY_CARE_PROVIDER_SITE_OTHER): Payer: Medicare Other | Admitting: Internal Medicine

## 2014-02-24 ENCOUNTER — Encounter (HOSPITAL_BASED_OUTPATIENT_CLINIC_OR_DEPARTMENT_OTHER): Payer: Self-pay | Admitting: Otolaryngology

## 2014-02-24 VITALS — BP 180/96 | HR 64 | Temp 97.9°F | Resp 20 | Ht 62.5 in | Wt 147.0 lb

## 2014-02-24 DIAGNOSIS — E032 Hypothyroidism due to medicaments and other exogenous substances: Secondary | ICD-10-CM

## 2014-02-24 DIAGNOSIS — F3289 Other specified depressive episodes: Secondary | ICD-10-CM

## 2014-02-24 DIAGNOSIS — E785 Hyperlipidemia, unspecified: Secondary | ICD-10-CM

## 2014-02-24 DIAGNOSIS — F329 Major depressive disorder, single episode, unspecified: Secondary | ICD-10-CM

## 2014-02-24 DIAGNOSIS — M316 Other giant cell arteritis: Secondary | ICD-10-CM

## 2014-02-24 DIAGNOSIS — I10 Essential (primary) hypertension: Secondary | ICD-10-CM

## 2014-02-24 MED ORDER — LISINOPRIL-HYDROCHLOROTHIAZIDE 20-25 MG PO TABS: 1.0000 | ORAL_TABLET | Freq: Every day | ORAL | Status: DC

## 2014-02-24 NOTE — Op Note (Signed)
NAMEChristella Peters             ACCOUNT NO.:  [id number]  MEDICAL RECORD NO.:  [id number]  LOCATION:                                 FACILITY:  PHYSICIAN:  Leonides Sake. Lucia Gaskins, M.D.DATE OF BIRTH:  07/20/1946  DATE OF PROCEDURE:  02/23/2014 DATE OF DISCHARGE:  02/23/2014                              OPERATIVE REPORT   PREOPERATIVE DIAGNOSIS:  Right-sided headaches, questionable temporitis.  POSTOPERATIVE DIAGNOSIS:  Right-sided headaches, questionable temporitis.  PROCEDURE:  Right temporal artery biopsy.  SURGEON:  Leonides Sake. Lucia Gaskins, M.D.  ANESTHESIA:  Local 1% Xylocaine with epinephrine, 3 mL.  COMPLICATIONS:  None.  BRIEF CLINICAL:  Caroline Peters is a 68 year old female, referred by Dr. Sarajane Jews for temporal artery biopsy.  She has been having headache for couple of weeks, was started on steroids that she has done better since taking the steroids.  She was taken to the operating room at this time for right temporal artery biopsy under local anesthetic.  DESCRIPTION OF PROCEDURE:  The right temporal area was palpated and was able to palpate a pulse within the hairline just above and anterior to the right ear.  This was marked out, a few of the hair overlying this proposed incision site was cut and removed.  Following injection of Xylocaine with epinephrine, the area was prepped with Betadine solution. Incision was made directly over the palpable pulse.  Dissection was carried down through subcutaneous tissue to the vessel which was identified.  At this point, it constricted a little bit and was dissected out superiorly and inferiorly for about 2-3 cm.  Clamps were placed on either side and about a 2 cm length of artery was excised. The distal ends were ligated with 4-0 Vicryl sutures.  The wound was irrigated with some saline.  Specimen was sent in saline to pathology. The defect was closed with a 4-0 chromic sutures subcutaneously and Dermabond on the skin.   Caroline Peters tolerated this well, and is discharged home later this morning on Tylenol and Motrin p.r.n. pain, Keflex 500 mg b.i.d. for a week and follow up in my office in 1 week for recheck of wound.          ______________________________ Leonides Sake Lucia Gaskins, M.D.    CEN/MEDQ  D:  02/23/2014  T:  02/23/2014  Job:  [id number]  cc:   Annie Main A. Sarajane Jews, MD

## 2014-02-24 NOTE — Progress Notes (Signed)
Pre-visit discussion using our clinic review tool. No additional management support is needed unless otherwise documented below in the visit note.  

## 2014-02-24 NOTE — Telephone Encounter (Signed)
Have her monitor bp at home and call with results in 2 weeks  Call for any bp > 180/100

## 2014-02-24 NOTE — Progress Notes (Signed)
Subjective:    Patient ID: Caroline Peters, female    DOB: Mar 31, 1946, 68 y.o.   MRN: [id number]  HPI   BP Readings from Last 3 Encounters:  02/24/14 180/96  02/23/14 174/91  02/23/14 49/11    68 year old patient who was seen recently complaining of right ear pain, as well as right-sided headaches.  She complained of some mild increased lacrimation of the right eye, but no visual disturbances.  No symptoms of PMR.  Clinical exam was suspicious for an enlarged tender temporal artery and the patient was placed on 40 mg of prednisone and referred for a TA biopsy which was performed yesterday.  The patient was noted to have elevated blood pressure readings yesterday, which has been confirmed today.  She generally feels well.  She remains on prednisone 40 mg daily. Her symptoms have largely resolved.  Unfortunately, a sedimentation rate was not performed at her initial encounter.  Past Medical History  Diagnosis Date  . DEPRESSION 04/16/2007  . HYPOTHYROIDISM 04/16/2007  . Osteopenia 06/2013    T score -2.0 FRAX 8.8%/0.8% stable from prior DEXA 2012  . Hypothyroid   . Cancer     Melanoma    History   Social History  . Marital Status: Married    Spouse Name: N/A    Number of Children: N/A  . Years of Education: N/A   Occupational History  . Not on file.   Social History Main Topics  . Smoking status: Never Smoker   . Smokeless tobacco: Never Used  . Alcohol Use: 7.0 oz/week    14 drink(s) per week     Comment: wine daily  . Drug Use: No  . Sexual Activity: Yes    Birth Control/ Protection: Post-menopausal   Other Topics Concern  . Not on file   Social History Narrative  . No narrative on file    Past Surgical History  Procedure Laterality Date  . Excision of melanoma  1978  . Hysteroscopy  1988    HYSTEROSCOPY,D&C  . Breast surgery      bx left breast  . Mass excision Right 02/23/2014    Procedure: BIOPSY TEMPORAL ARTERY RIGHT;  Surgeon: Rozetta Nunnery,  MD;  Location: [place];  Service: ENT;  Laterality: Right;    Family History  Problem Relation Age of Onset  . Diabetes Mother   . Heart disease Mother   . Dementia Mother   . Diabetes Father   . Heart disease Father   . Cancer Father     Skin cancer  . Breast cancer Maternal Grandmother     Age 69  . Stomach cancer Neg Hx   . Colon cancer Neg Hx     Allergies  Allergen Reactions  . Compazine     extrapyramidal syndrome    Current Outpatient Prescriptions on File Prior to Visit  Medication Sig Dispense Refill  . Calcium Carbonate-Vitamin D (CALTRATE 600+D) 600-400 MG-UNIT per tablet Take 2 tablets by mouth daily.        . cephALEXin (KEFLEX) 500 MG capsule Take 1 capsule (500 mg total) by mouth 2 (two) times daily.  10 capsule  0  . CRESTOR 10 MG tablet TAKE 1 TABLET ONCE DAILY.  30 tablet  5  . levothyroxine (SYNTHROID, LEVOTHROID) 112 MCG tablet TAKE 1 TABLET ONCE DAILY.  90 tablet  0  . predniSONE (DELTASONE) 10 MG tablet Take 4 tablets (40 mg total) by mouth daily with breakfast.  60 tablet  0  . sertraline (ZOLOFT) 100 MG tablet Take 100 mg by mouth daily.      . temazepam (RESTORIL) 15 MG capsule Take 1 capsule (15 mg total) by mouth at bedtime as needed for sleep.  30 capsule  3   No current facility-administered medications on file prior to visit.    BP 180/96  Pulse 64  Temp(Src) 97.9 F (36.6 C) (Oral)  Resp 20  Ht 5' 2.5" (1.588 m)  Wt 147 lb (66.679 kg)  BMI 26.44 kg/m2  SpO2 95%       Review of Systems  Constitutional: Negative.   HENT: Negative for congestion, dental problem, hearing loss, rhinorrhea, sinus pressure, sore throat and tinnitus.   Eyes: Negative for pain, discharge and visual disturbance.  Respiratory: Negative for cough and shortness of breath.   Cardiovascular: Negative for chest pain, palpitations and leg swelling.  Gastrointestinal: Negative for nausea, vomiting, abdominal pain, diarrhea, constipation, blood  in stool and abdominal distention.  Genitourinary: Negative for dysuria, urgency, frequency, hematuria, flank pain, vaginal bleeding, vaginal discharge, difficulty urinating, vaginal pain and pelvic pain.  Musculoskeletal: Negative for arthralgias, gait problem and joint swelling.  Skin: Negative for rash.  Neurological: Positive for headaches. Negative for dizziness, syncope, speech difficulty, weakness and numbness.  Hematological: Negative for adenopathy.  Psychiatric/Behavioral: Negative for behavioral problems, dysphoric mood and agitation. The patient is not nervous/anxious.        Objective:   Physical Exam  Constitutional: She appears well-developed and well-nourished. No distress.  Blood pressure 200/100 Slightly anxious  Skin:  Right TA biopsy site clean; closed with glue          Assessment & Plan:    Status post TA biopsy Low clinical suspicion for temporal arteritis.  In view of her elevated blood pressure will decrease prednisone to 20 mg daily pending results of the biopsy Hypertension.  Low-salt diet recommended.  We'll place on combination therapy and recheck next week

## 2014-02-24 NOTE — Patient Instructions (Signed)
Limit your sodium (Salt) intake  Please check your blood pressure on a regular basis.    Return next week for followup  Decrease prednisone to 20 mg daily

## 2014-02-25 NOTE — Telephone Encounter (Signed)
Dr Raliegh Ip put her on Lisinopril and cut the prednisone in half

## 2014-03-03 ENCOUNTER — Other Ambulatory Visit: Payer: Self-pay | Admitting: *Deleted

## 2014-03-03 ENCOUNTER — Ambulatory Visit (INDEPENDENT_AMBULATORY_CARE_PROVIDER_SITE_OTHER): Payer: Medicare Other | Admitting: Internal Medicine

## 2014-03-03 ENCOUNTER — Encounter: Payer: Self-pay | Admitting: Internal Medicine

## 2014-03-03 VITALS — BP 150/80 | HR 69 | Temp 97.8°F | Resp 20 | Ht 62.5 in | Wt 145.0 lb

## 2014-03-03 DIAGNOSIS — I10 Essential (primary) hypertension: Secondary | ICD-10-CM

## 2014-03-03 MED ORDER — PREDNISONE 5 MG PO TABS: ORAL_TABLET | ORAL | Status: DC

## 2014-03-03 NOTE — Progress Notes (Signed)
Pre visit review using our clinic review tool, if applicable. No additional management support is needed unless otherwise documented below in the visit note. 

## 2014-03-03 NOTE — Progress Notes (Signed)
Subjective:    Patient ID: Caroline Peters, female    DOB: Sep 12, 1945, 68 y.o.   MRN: [id number]  HPI 68 year old patient who is seen today in followup.  She presented recently with severe right-sided headaches and clinical concern for temporal arteritis.  TA biopsy has been performed and was normal.  She was initially placed on an appropriate high prednisone dose, but now is in the process of a quick taper.  She continues to have some right temporal headaches, but these are improved. She continues to track blood pressures carefully and systolic blood pressures are occasionally mildly elevated.  In general, she feels well today.  She has noticed some increasing stress and irritability on high-dose prednisone  Past Medical History  Diagnosis Date  . DEPRESSION 04/16/2007  . HYPOTHYROIDISM 04/16/2007  . Osteopenia 06/2013    T score -2.0 FRAX 8.8%/0.8% stable from prior DEXA 2012  . Hypothyroid   . Cancer     Melanoma    History   Social History  . Marital Status: Married    Spouse Name: N/A    Number of Children: N/A  . Years of Education: N/A   Occupational History  . Not on file.   Social History Main Topics  . Smoking status: Never Smoker   . Smokeless tobacco: Never Used  . Alcohol Use: 7.0 oz/week    14 drink(s) per week     Comment: wine daily  . Drug Use: No  . Sexual Activity: Yes    Birth Control/ Protection: Post-menopausal   Other Topics Concern  . Not on file   Social History Narrative  . No narrative on file    Past Surgical History  Procedure Laterality Date  . Excision of melanoma  1978  . Hysteroscopy  1988    HYSTEROSCOPY,D&C  . Breast surgery      bx left breast  . Mass excision Right 02/23/2014    Procedure: BIOPSY TEMPORAL ARTERY RIGHT;  Surgeon: Rozetta Nunnery, MD;  Location: [place];  Service: ENT;  Laterality: Right;    Family History  Problem Relation Age of Onset  . Diabetes Mother   . Heart disease Mother     . Dementia Mother   . Diabetes Father   . Heart disease Father   . Cancer Father     Skin cancer  . Breast cancer Maternal Grandmother     Age 69  . Stomach cancer Neg Hx   . Colon cancer Neg Hx     Allergies  Allergen Reactions  . Compazine     extrapyramidal syndrome    Current Outpatient Prescriptions on File Prior to Visit  Medication Sig Dispense Refill  . Calcium Carbonate-Vitamin D (CALTRATE 600+D) 600-400 MG-UNIT per tablet Take 2 tablets by mouth daily.        . CRESTOR 10 MG tablet TAKE 1 TABLET ONCE DAILY.  30 tablet  5  . levothyroxine (SYNTHROID, LEVOTHROID) 112 MCG tablet TAKE 1 TABLET ONCE DAILY.  90 tablet  0  . lisinopril-hydrochlorothiazide (PRINZIDE,ZESTORETIC) 20-25 MG per tablet Take 1 tablet by mouth daily.  90 tablet  3  . sertraline (ZOLOFT) 100 MG tablet Take 50 mg by mouth daily.       . temazepam (RESTORIL) 15 MG capsule Take 1 capsule (15 mg total) by mouth at bedtime as needed for sleep.  30 capsule  3   No current facility-administered medications on file prior to visit.    BP 150/80  Pulse 69  Temp(Src) 97.8 F (36.6 C) (Oral)  Resp 20  Ht 5' 2.5" (1.588 m)  Wt 145 lb (65.772 kg)  BMI 26.08 kg/m2  SpO2 97%      Review of Systems  Constitutional: Negative.   HENT: Negative for congestion, dental problem, hearing loss, rhinorrhea, sinus pressure, sore throat and tinnitus.   Eyes: Negative for pain, discharge and visual disturbance.  Respiratory: Negative for cough and shortness of breath.   Cardiovascular: Negative for chest pain, palpitations and leg swelling.  Gastrointestinal: Negative for nausea, vomiting, abdominal pain, diarrhea, constipation, blood in stool and abdominal distention.  Genitourinary: Negative for dysuria, urgency, frequency, hematuria, flank pain, vaginal bleeding, vaginal discharge, difficulty urinating, vaginal pain and pelvic pain.  Musculoskeletal: Negative for arthralgias, gait problem and joint swelling.   Skin: Negative for rash.  Neurological: Negative for dizziness, syncope, speech difficulty, weakness, numbness and headaches.  Hematological: Negative for adenopathy.  Psychiatric/Behavioral: Positive for behavioral problems. Negative for dysphoric mood and agitation. The patient is nervous/anxious and is hyperactive.        Objective:   Physical Exam  Constitutional: She is oriented to person, place, and time. She appears well-developed and well-nourished.  Blood pressure 146/80  HENT:  Head: Normocephalic.  Right Ear: External ear normal.  Left Ear: External ear normal.  Mouth/Throat: Oropharynx is clear and moist.  Eyes: Conjunctivae and EOM are normal. Pupils are equal, round, and reactive to light.  Neck: Normal range of motion. Neck supple. No thyromegaly present.  Cardiovascular: Normal rate, regular rhythm, normal heart sounds and intact distal pulses.   Pulmonary/Chest: Effort normal and breath sounds normal.  Abdominal: Soft. Bowel sounds are normal. She exhibits no mass. There is no tenderness.  Musculoskeletal: Normal range of motion.  Lymphadenopathy:    She has no cervical adenopathy.  Neurological: She is alert and oriented to person, place, and time.  Skin: Skin is warm and dry. No rash noted.  Psychiatric: She has a normal mood and affect. Her behavior is normal.          Assessment & Plan:   Hypertension improved.  Also, will continue to improve off prednisone.  We'll continue taper Anxiety depression.  Patient has self titrated down to 50 mg from 150 mg.  Will increase to 100 mg daily  Continue home blood pressure monitoring and low salt diet and exercise regimen and

## 2014-03-03 NOTE — Patient Instructions (Signed)
Prednisone 10 mg, every other day for one week and then discontinue Increase Zoloft 100 mg daily  Limit your sodium (Salt) intake    It is important that you exercise regularly, at least 20 minutes 3 to 4 times per week.  If you develop chest pain or shortness of breath seek  medical attention.  Please check your blood pressure on a regular basis.  If it is consistently greater than 150/90, please make an office appointment.

## 2014-03-04 ENCOUNTER — Telehealth: Payer: Self-pay | Admitting: Internal Medicine

## 2014-03-04 NOTE — Telephone Encounter (Signed)
Relevant patient education mailed to patient.  

## 2014-03-08 ENCOUNTER — Encounter: Payer: Self-pay | Admitting: Neurology

## 2014-03-08 ENCOUNTER — Ambulatory Visit (INDEPENDENT_AMBULATORY_CARE_PROVIDER_SITE_OTHER): Payer: Medicare Other | Admitting: Neurology

## 2014-03-08 VITALS — BP 115/75 | HR 88 | Ht 63.0 in | Wt 142.0 lb

## 2014-03-08 DIAGNOSIS — F329 Major depressive disorder, single episode, unspecified: Secondary | ICD-10-CM

## 2014-03-08 DIAGNOSIS — F3289 Other specified depressive episodes: Secondary | ICD-10-CM

## 2014-03-08 DIAGNOSIS — R51 Headache: Secondary | ICD-10-CM

## 2014-03-08 DIAGNOSIS — R519 Headache, unspecified: Secondary | ICD-10-CM | POA: Insufficient documentation

## 2014-03-08 MED ORDER — GABAPENTIN 100 MG PO CAPS: 100.0000 mg | ORAL_CAPSULE | Freq: Three times a day (TID) | ORAL | Status: DC

## 2014-03-08 NOTE — Progress Notes (Signed)
PATIENT: Caroline Peters DOB: 1946/08/01  HISTORICAL  Caroline Peters is a right-handed female, referred by her optometrist Dr. Melissa Noon, and her primary care physician Dr. Phoebe Sharps for evaluation of headaches  She is going through a lot of stress over the past 3 years, has past medical history of hypothyroidism, on supplement, hyperlipidemia, denies a previous history of migraines,  She presented with right parietal area pain since June18th, ice cream  frozen pain, sharp, transient, but with residual dull achy pain lasting for 20 minutes, multiple episodes in a day  She denies visual loss, no lateralized motor or sensory deficit, was treated with prednisone 40 mg every day for a while, there was no significant improvement, she complains of unbearable side effect, including moodiness,  She also had right temporal artery biopsy, which showed no evidence of temporal arteritis, laboratory evaluation showed normal TSH, ESR was 9 in February 2015  She has tried over-the-counter ibuprofen, which has been helpful   REVIEW OF SYSTEMS: Full 14 system review of systems performed and notable only for headache, depression, anxiety  ALLERGIES: Allergies  Allergen Reactions  . Compazine     extrapyramidal syndrome    HOME MEDICATIONS: Current Outpatient Prescriptions on File Prior to Visit  Medication Sig Dispense Refill  . Calcium Carbonate-Vitamin D (CALTRATE 600+D) 600-400 MG-UNIT per tablet Take 2 tablets by mouth daily.        . CRESTOR 10 MG tablet TAKE 1 TABLET ONCE DAILY.  30 tablet  5  . levothyroxine (SYNTHROID, LEVOTHROID) 112 MCG tablet TAKE 1 TABLET ONCE DAILY.  90 tablet  0  . lisinopril-hydrochlorothiazide (PRINZIDE,ZESTORETIC) 20-25 MG per tablet Take 1 tablet by mouth daily.  90 tablet  3  . sertraline (ZOLOFT) 100 MG tablet Take 50 mg by mouth daily.       . temazepam (RESTORIL) 15 MG capsule Take 1 capsule (15 mg total) by mouth at bedtime as needed for sleep.  30  capsule  3   PAST MEDICAL HISTORY: Past Medical History  Diagnosis Date  . DEPRESSION 04/16/2007  . HYPOTHYROIDISM 04/16/2007  . Osteopenia 06/2013    T score -2.0 FRAX 8.8%/0.8% stable from prior DEXA 2012  . Hypothyroid   . Cancer     Melanoma  . HA (headache)     PAST SURGICAL HISTORY: Past Surgical History  Procedure Laterality Date  . Excision of melanoma  1978  . Hysteroscopy  1988    HYSTEROSCOPY,D&C  . Breast surgery      bx left breast  . Mass excision Right 02/23/2014    Procedure: BIOPSY TEMPORAL ARTERY RIGHT;  Surgeon: Rozetta Nunnery, MD;  Location: [facility] [facility];  Service: ENT;  Laterality: Right;    FAMILY HISTORY: Family History  Problem Relation Age of Onset  . Diabetes Mother   . Heart disease Mother   . Dementia Mother   . Diabetes Father   . Heart disease Father   . Cancer Father     Skin cancer  . Breast cancer Maternal Grandmother     Age 30  . Stomach cancer Neg Hx   . Colon cancer Neg Hx     SOCIAL HISTORY:  History   Social History  . Marital Status: Married    Spouse Name: N/A    Number of Children: 2  . Years of Education: N/A   Occupational History  . Not on file.   Social History Main Topics  . Smoking status: Never Smoker   .  Smokeless tobacco: Never Used  . Alcohol Use: 7.0 oz/week    14 drink(s) per week     Comment: wine daily  . Drug Use: No  . Sexual Activity: Yes    Birth Control/ Protection: Post-menopausal   Other Topics Concern  . Not on file   Social History Narrative  . No narrative on file    PHYSICAL EXAM   Filed Vitals:   03/08/14 1048  BP: 115/75  Pulse: 88  Height: _0  (1.6 m)  Weight: 142 lb (64.411 kg)    Not recorded    Body mass index is 25.16 kg/(m^2).   Generalized: In no acute distress  Neck: Supple, no carotid bruits   Cardiac: Regular rate rhythm  Pulmonary: Clear to auscultation bilaterally  Musculoskeletal: No deformity  Neurological  examination  Mentation: Alert oriented to time, place, history taking, and causual conversation, tearful during today's interview  Cranial nerve II-XII: Pupils were equal round reactive to light. Extraocular movements were full.  Visual field were full on confrontational test. Bilateral fundi were sharp.  Facial sensation and strength were normal. Hearing was intact to finger rubbing bilaterally. Uvula tongue midline.  Head turning and shoulder shrug and were normal and symmetric.Tongue protrusion into cheek strength was normal.  Motor: Normal tone, bulk and strength.  Sensory: Intact to fine touch, pinprick, preserved vibratory sensation, and proprioception at toes.  Coordination: Normal finger to nose, heel-to-shin bilaterally there was no truncal ataxia  Gait: Rising up from seated position without assistance, normal stance, without trunk ataxia, moderate stride, good arm swing, smooth turning, able to perform tiptoe, and heel walking without difficulty.   Romberg signs: Negative  Deep tendon reflexes: Brachioradialis 2/2, biceps 2/2, triceps 2/2, patellar 2/2, Achilles 2/2, plantar responses were flexor bilaterally.   DIAGNOSTIC DATA (LABS, IMAGING, TESTING) - I reviewed patient records, labs, notes, testing and imaging myself where available.  Lab Results  Component Value Date   WBC 5.8 10/22/2013   HGB 13.8 10/22/2013   HCT 42.3 10/22/2013   MCV 93.4 10/22/2013   PLT 225.0 10/22/2013      Component Value Date/Time   NA 142 10/22/2013 1444   K 4.6 10/22/2013 1444   CL 105 10/22/2013 1444   CO2 26 10/22/2013 1444   GLUCOSE 73 10/22/2013 1444   BUN 19 10/22/2013 1444   CREATININE 0.8 10/22/2013 1444   CALCIUM 10.9* 10/22/2013 1444   PROT 7.6 10/22/2013 1444   ALBUMIN 4.7 10/22/2013 1444   AST 24 10/22/2013 1444   ALT 31 10/22/2013 1444   ALKPHOS 60 10/22/2013 1444   BILITOT 0.4 10/22/2013 1444   GFRNONAA 66.95 11/07/2009 1417   GFRAA 93 09/09/2008 0932   Lab Results  Component Value  Date   CHOL 207* 03/03/2013   HDL 108.20 03/03/2013   LDLCALC 87 12/11/2011   LDLDIRECT 85.2 03/03/2013   TRIG 56.0 03/03/2013   CHOLHDL 2 03/03/2013   No results found for this basename: HGBA1C   No results found for this basename: VITAMINB12   Lab Results  Component Value Date   TSH 2.62 10/22/2013      ASSESSMENT AND PLAN  Caroline Peters is a 68 y.o. female complains of  onset of headaches since June18 2015 essentially normal neurological examinations, right temporal artery biopsy shoulder no evidence of temporal arteritis  Differentiation diagnosis include tension headaches, need to rule out central nervous system structural lesion, in 68 years old female with new onset headaches, history of melanoma  MRI  of brain Return to clinic in 3-4 weeks Gabapentin 151m 3 times a day    YMarcial Pacas M.D. Ph.D.  [facility] [address] [place] [phone]

## 2014-03-18 ENCOUNTER — Ambulatory Visit
Admission: RE | Admit: 2014-03-18 | Discharge: 2014-03-18 | Disposition: A | Discharge status: Home / Self Care | Payer: Medicare Other | Source: Ambulatory Visit | Attending: Neurology | Admitting: Neurology

## 2014-03-18 DIAGNOSIS — R51 Headache: Secondary | ICD-10-CM

## 2014-03-18 DIAGNOSIS — F3289 Other specified depressive episodes: Secondary | ICD-10-CM

## 2014-03-18 DIAGNOSIS — F329 Major depressive disorder, single episode, unspecified: Secondary | ICD-10-CM

## 2014-03-19 ENCOUNTER — Other Ambulatory Visit: Payer: Self-pay | Admitting: Internal Medicine

## 2014-03-24 ENCOUNTER — Telehealth: Payer: Self-pay | Admitting: Neurology

## 2014-03-24 NOTE — Telephone Encounter (Signed)
Spoke with patient and explained that the Dr Krista Blue not in office but will forward to work in physician to review, once done will contact with those results, she verbalized understanding  But anxious and wanting to make sure she does not have a brain tumor.

## 2014-03-24 NOTE — Telephone Encounter (Signed)
Pramod: please review the impression of the report and correct probable typo. thx

## 2014-03-24 NOTE — Telephone Encounter (Signed)
Patient requesting MRI results.  Please call and advise. °

## 2014-03-29 ENCOUNTER — Telehealth: Payer: Self-pay | Admitting: Neurology

## 2014-03-29 NOTE — Telephone Encounter (Signed)
Patient is in the lobby wanting to know the results of her MRI done 2 weeks ago. Please advise.

## 2014-03-29 NOTE — Telephone Encounter (Signed)
Patient has a sooner appt with Dr.Yan 03/30/2014. Patient walked into the Lobby asking for MRI results and Dr . Krista Blue had a cx.

## 2014-03-30 ENCOUNTER — Encounter: Payer: Self-pay | Admitting: Neurology

## 2014-03-30 ENCOUNTER — Ambulatory Visit (INDEPENDENT_AMBULATORY_CARE_PROVIDER_SITE_OTHER): Payer: Medicare Other | Admitting: Neurology

## 2014-03-30 ENCOUNTER — Other Ambulatory Visit: Payer: Self-pay | Admitting: Internal Medicine

## 2014-03-30 VITALS — BP 124/73 | HR 82 | Ht 63.0 in | Wt 142.0 lb

## 2014-03-30 DIAGNOSIS — R51 Headache: Secondary | ICD-10-CM

## 2014-03-30 DIAGNOSIS — G44219 Episodic tension-type headache, not intractable: Secondary | ICD-10-CM

## 2014-03-30 DIAGNOSIS — F329 Major depressive disorder, single episode, unspecified: Secondary | ICD-10-CM

## 2014-03-30 DIAGNOSIS — F3289 Other specified depressive episodes: Secondary | ICD-10-CM

## 2014-03-30 MED ORDER — GABAPENTIN 100 MG PO CAPS: 200.0000 mg | ORAL_CAPSULE | Freq: Three times a day (TID) | ORAL | Status: DC

## 2014-03-30 NOTE — Patient Instructions (Signed)
Magnesium oxide 400mg Riboflavin 100mg  Twice a day. 

## 2014-03-30 NOTE — Progress Notes (Signed)
PATIENT: Caroline Peters DOB: 09/14/1945  HISTORICAL  Caroline Peters is a 68 right-handed female, referred by her optometrist Dr. Melissa Noon, and her primary care physician Dr. Phoebe Sharps for evaluation of headaches  She is going through a lot of stress over the past 3 years, has past medical history of hypothyroidism, on supplement, hyperlipidemia, denies a previous history of migraines,  She presented with right parietal area pain since June18th, ice cream  frozen pain, sharp, transient, but with residual dull achy pain lasting for 20 minutes, multiple episodes in a day  She denies visual loss, no lateralized motor or sensory deficit, was treated with prednisone 40 mg every day for a while, there was no significant improvement, she complains of unbearable side effect, including moodiness,  She also had right temporal artery biopsy, which showed no evidence of temporal arteritis, laboratory evaluation showed normal TSH, ESR was 9 in February 2015  She has tried over-the-counter ibuprofen, which has been helpful   UPDATE July 28th 2015:  She is taking Gabapentin 125m tid, which has been very helpful, she has no significant side effect, she still has episodes of sudden onset transient right parietal area sharp pain, daily, lasting 30 seconds then still has trail of discomfort afterwards.  With had reviewed MRI of brain together, mild small vessel disease, no acute lesions     REVIEW OF SYSTEMS: Full 14 system review of systems performed and notable only for headache, depression, anxiety  ALLERGIES: Allergies  Allergen Reactions  . Compazine     extrapyramidal syndrome    HOME MEDICATIONS: Current Outpatient Prescriptions on File Prior to Visit  Medication Sig Dispense Refill  . Calcium Carbonate-Vitamin D (CALTRATE 600+D) 600-400 MG-UNIT per tablet Take 2 tablets by mouth daily.        . CRESTOR 10 MG tablet TAKE 1 TABLET ONCE DAILY.  30 tablet  5  . levothyroxine  (SYNTHROID, LEVOTHROID) 112 MCG tablet TAKE 1 TABLET ONCE DAILY.  90 tablet  0  . lisinopril-hydrochlorothiazide (PRINZIDE,ZESTORETIC) 20-25 MG per tablet Take 1 tablet by mouth daily.  90 tablet  3  . sertraline (ZOLOFT) 100 MG tablet Take 50 mg by mouth daily.       . temazepam (RESTORIL) 15 MG capsule Take 1 capsule (15 mg total) by mouth at bedtime as needed for sleep.  30 capsule  3   PAST MEDICAL HISTORY: Past Medical History  Diagnosis Date  . DEPRESSION 04/16/2007  . HYPOTHYROIDISM 04/16/2007  . Osteopenia 06/2013    T score -2.0 FRAX 8.8%/0.8% stable from prior DEXA 2012  . Hypothyroid   . Cancer     Melanoma  . HA (headache)     PAST SURGICAL HISTORY: Past Surgical History  Procedure Laterality Date  . Excision of melanoma  1978  . Hysteroscopy  1988    HYSTEROSCOPY,D&C  . Breast surgery      bx left breast  . Mass excision Right 02/23/2014    Procedure: BIOPSY TEMPORAL ARTERY RIGHT;  Surgeon: CRozetta Nunnery MD;  Location[facility]  Service: ENT;  Laterality: Right;    FAMILY HISTORY: Family History  Problem Relation Age of Onset  . Diabetes Mother   . Heart disease Mother   . Dementia Mother   . Diabetes Father   . Heart disease Father   . Cancer Father     Skin cancer  . Breast cancer Maternal Grandmother     Age 285 . Stomach cancer  Neg Hx   . Colon cancer Neg Hx     SOCIAL HISTORY:  History   Social History  . Marital Status: Married    Spouse Name: N/A    Number of Children: 2  . Years of Education: N/A   Occupational History  . Not on file.   Social History Main Topics  . Smoking status: Never Smoker   . Smokeless tobacco: Never Used  . Alcohol Use: 7.0 oz/week    14 drink(s) per week     Comment: wine daily  . Drug Use: No  . Sexual Activity: Yes    Birth Control/ Protection: Post-menopausal   Other Topics Concern  . Not on file   Social History Narrative  . No narrative on file    PHYSICAL EXAM     Filed Vitals:   03/30/14 1119  BP: 124/73  Pulse: 82  Height: _0  (1.6 m)  Weight: 142 lb (64.411 kg)    Not recorded    Body mass index is 25.16 kg/(m^2).   Generalized: In no acute distress  Neck: Supple, no carotid bruits   Cardiac: Regular rate rhythm  Pulmonary: Clear to auscultation bilaterally  Musculoskeletal: No deformity  Neurological examination  Mentation: Alert oriented to time, place, history taking, and causual conversation, tearful during today's interview  Cranial nerve II-XII: Pupils were equal round reactive to light. Extraocular movements were full.  Visual field were full on confrontational test. Bilateral fundi were sharp.  Facial sensation and strength were normal. Hearing was intact to finger rubbing bilaterally. Uvula tongue midline.  Head turning and shoulder shrug and were normal and symmetric.Tongue protrusion into cheek strength was normal.  Motor: Normal tone, bulk and strength.  Sensory: Intact to fine touch, pinprick, preserved vibratory sensation, and proprioception at toes.  Coordination: Normal finger to nose, heel-to-shin bilaterally there was no truncal ataxia  Gait: Rising up from seated position without assistance, normal stance, without trunk ataxia, moderate stride, good arm swing, smooth turning, able to perform tiptoe, and heel walking without difficulty.   Romberg signs: Negative  Deep tendon reflexes: Brachioradialis 2/2, biceps 2/2, triceps 2/2, patellar 2/2, Achilles 2/2, plantar responses were flexor bilaterally.   DIAGNOSTIC DATA (LABS, IMAGING, TESTING) - I reviewed patient records, labs, notes, testing and imaging myself where available.  Lab Results  Component Value Date   WBC 5.8 10/22/2013   HGB 13.8 10/22/2013   HCT 42.3 10/22/2013   MCV 93.4 10/22/2013   PLT 225.0 10/22/2013      Component Value Date/Time   NA 142 10/22/2013 1444   K 4.6 10/22/2013 1444   CL 105 10/22/2013 1444   CO2 26 10/22/2013 1444    GLUCOSE 73 10/22/2013 1444   BUN 19 10/22/2013 1444   CREATININE 0.8 10/22/2013 1444   CALCIUM 10.9* 10/22/2013 1444   PROT 7.6 10/22/2013 1444   ALBUMIN 4.7 10/22/2013 1444   AST 24 10/22/2013 1444   ALT 31 10/22/2013 1444   ALKPHOS 60 10/22/2013 1444   BILITOT 0.4 10/22/2013 1444   GFRNONAA 66.95 11/07/2009 1417   GFRAA 93 09/09/2008 0932   Lab Results  Component Value Date   CHOL 207* 03/03/2013   HDL 108.20 03/03/2013   LDLCALC 87 12/11/2011   LDLDIRECT 85.2 03/03/2013   TRIG 56.0 03/03/2013   CHOLHDL 2 03/03/2013   No results found for this basename: HGBA1C   No results found for this basename: VITAMINB12   Lab Results  Component Value Date   TSH 2.62  10/22/2013    ASSESSMENT AND PLAN  Caroline Peters is a 68 y.o. female complains of  onset of headaches since June18 2015 essentially normal neurological examinations, right temporal artery biopsy shoulder no evidence of temporal arteritis, MRI of the brain only showed small vessel disease, symptoms has much improved was gabapentin 100 mg 3 times a day, but remains symptomatic, may increase gabapentin to 100 mg 2 tablets 3 times a day, may also try over-the-counter magnesium oxide 400 mg twice a day, riboflavin 100 mg twice a day, return to clinic in 6 months with Rhae Hammock, M.D. Ph.D.  [facility] [address], [place], [place] [address] [phone]

## 2014-04-12 ENCOUNTER — Ambulatory Visit: Payer: Medicare Other | Admitting: Neurology

## 2014-05-29 ENCOUNTER — Other Ambulatory Visit: Payer: Self-pay | Admitting: Internal Medicine

## 2014-06-15 ENCOUNTER — Encounter: Payer: Self-pay | Admitting: Physician Assistant

## 2014-06-15 ENCOUNTER — Ambulatory Visit (INDEPENDENT_AMBULATORY_CARE_PROVIDER_SITE_OTHER): Payer: Medicare Other | Admitting: Physician Assistant

## 2014-06-15 VITALS — BP 122/78 | HR 72 | Temp 98.4°F | Resp 18 | Wt 140.9 lb

## 2014-06-15 DIAGNOSIS — J069 Acute upper respiratory infection, unspecified: Secondary | ICD-10-CM

## 2014-06-15 MED ORDER — DEXTROMETHORPHAN POLISTIREX 30 MG/5ML PO LQCR: 15.0000 mg | ORAL | Status: DC | PRN

## 2014-06-15 NOTE — Progress Notes (Signed)
Subjective:    Patient ID: Caroline Peters, female    DOB: June 10, 1946, 68 y.o.   MRN: [id number]  URI  This is a new problem. The current episode started in the past 7 days (3 days). The problem has been gradually worsening. Associated symptoms include congestion, coughing (dry), headaches, rhinorrhea, sneezing and a sore throat (improving). Pertinent negatives include no abdominal pain, chest pain, diarrhea, dysuria, ear pain, joint pain, joint swelling, nausea, neck pain, plugged ear sensation, rash, sinus pain, swollen glands, vomiting or wheezing. Treatments tried: multisymptom cold/flu otc medicine, mucinex. The treatment provided mild relief.      Review of Systems  Constitutional: Positive for fever (no documented temp.) and chills.  HENT: Positive for congestion, rhinorrhea, sneezing and sore throat (improving). Negative for ear pain and postnasal drip.   Respiratory: Positive for cough (dry). Negative for shortness of breath and wheezing.   Cardiovascular: Negative for chest pain.  Gastrointestinal: Negative for nausea, vomiting, abdominal pain and diarrhea.  Genitourinary: Negative for dysuria.  Musculoskeletal: Negative for joint pain and neck pain.  Skin: Negative for rash.  Neurological: Positive for headaches. Negative for syncope.  All other systems reviewed and are negative.   Past Medical History  Diagnosis Date  . DEPRESSION 04/16/2007  . HYPOTHYROIDISM 04/16/2007  . Osteopenia 06/2013    T score -2.0 FRAX 8.8%/0.8% stable from prior DEXA 2012  . Hypothyroid   . Cancer     Melanoma  . HA (headache)     History   Social History  . Marital Status: Married    Spouse Name: [name]    Number of Children: N/A  . Years of Education: college   Occupational History  . Not on file.   Social History Main Topics  . Smoking status: Never Smoker   . Smokeless tobacco: Never Used  . Alcohol Use: 7.0 oz/week    14 drink(s) per week     Comment: wine daily  . Drug  Use: No  . Sexual Activity: Yes    Birth Control/ Protection: Post-menopausal   Other Topics Concern  . Not on file   Social History Narrative   Patient lives at home with her husband [name]). 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   Education college.   Right handed.   Caffeine one cup daily    Past Surgical History  Procedure Laterality Date  . Excision of melanoma  1978  . Hysteroscopy  1988    HYSTEROSCOPY,D&C  . Breast surgery      bx left breast  . Mass excision Right 02/23/2014    Procedure: BIOPSY TEMPORAL ARTERY RIGHT;  Surgeon: Rozetta Nunnery, MD;  Location: [facility];  Service: ENT;  Laterality: Right;    Family History  Problem Relation Age of Onset  . Diabetes Mother   . Heart disease Mother   . Dementia Mother   . Diabetes Father   . Heart disease Father   . Cancer Father     Skin cancer  . Breast cancer Maternal Grandmother     Age 93  . Stomach cancer Neg Hx   . Colon cancer Neg Hx     Allergies  Allergen Reactions  . Compazine     extrapyramidal syndrome  . Prednisone     Mood swings - crying     Current Outpatient Prescriptions on File Prior to Visit  Medication Sig Dispense Refill  . Calcium Carbonate-Vitamin D (CALTRATE 600+D) 600-400 MG-UNIT per tablet  Take 2 tablets by mouth daily.        . CRESTOR 10 MG tablet TAKE 1 TABLET ONCE DAILY.  30 tablet  0  . levothyroxine (SYNTHROID, LEVOTHROID) 112 MCG tablet TAKE 1 TABLET ONCE DAILY.  90 tablet  0  . lisinopril-hydrochlorothiazide (PRINZIDE,ZESTORETIC) 20-25 MG per tablet Take 1 tablet by mouth daily.  90 tablet  3  . sertraline (ZOLOFT) 100 MG tablet Take 100 mg by mouth daily.       . temazepam (RESTORIL) 15 MG capsule Take 1 capsule (15 mg total) by mouth at bedtime as needed for sleep.  30 capsule  3   No current facility-administered medications on file prior to visit.    EXAM: BP 122/78  Pulse 72  Temp(Src) 98.4 F (36.9 C) (Oral)  Resp 18  Wt 140  lb 14.4 oz (63.912 kg)  SpO2 96%     Objective:   Physical Exam  Nursing note and vitals reviewed. Constitutional: She is oriented to person, place, and time. She appears well-developed and well-nourished. No distress.  HENT:  Head: Normocephalic and atraumatic.  Right Ear: External ear normal.  Left Ear: External ear normal.  Nose: Nose normal.  Mouth/Throat: No oropharyngeal exudate.  Oropharynx is slightly erythematous, no exudate. Bilateral TMs normal. Bilateral frontal and maxillary sinuses non-TTP.  Eyes: Conjunctivae and EOM are normal.  Neck: Normal range of motion. Neck supple.  Cardiovascular: Normal rate, regular rhythm and intact distal pulses.   Pulmonary/Chest: Effort normal and breath sounds normal. No stridor. No respiratory distress. She has no wheezes. She has no rales. She exhibits no tenderness.  Lymphadenopathy:    She has no cervical adenopathy.  Neurological: She is alert and oriented to person, place, and time.  Skin: Skin is warm and dry. She is not diaphoretic. No pallor.  Psychiatric: She has a normal mood and affect. Her behavior is normal. Judgment and thought content normal.     Lab Results  Component Value Date   WBC 5.8 10/22/2013   HGB 13.8 10/22/2013   HCT 42.3 10/22/2013   PLT 225.0 10/22/2013   GLUCOSE 73 10/22/2013   CHOL 207* 03/03/2013   TRIG 56.0 03/03/2013   HDL 108.20 03/03/2013   LDLDIRECT 85.2 03/03/2013   LDLCALC 87 12/11/2011   ALT 31 10/22/2013   AST 24 10/22/2013   NA 142 10/22/2013   K 4.6 10/22/2013   CL 105 10/22/2013   CREATININE 0.8 10/22/2013   BUN 19 10/22/2013   CO2 26 10/22/2013   TSH 2.62 10/22/2013        Assessment & Plan:  [name] was seen today for cough.  Diagnoses and associated orders for this visit:  Acute upper respiratory infection Comments: Symptomatic treatment with OTC Mucinex, nasal steroid, antihistamine, rest, push fluids, watchful waiting.    Return precautions provided, and patient handout on  URI.  Plan to follow up as needed, or for worsening or persistent symptoms despite treatment.  Patient Instructions  Plain Over the Counter Mucinex (NOT Mucinex D) for thick secretions  Try taking over the counter plain Delsym to help your cough symptoms.  Force NON dairy fluids, drinking plenty of water is best.    Over the Counter Flonase OR Nasacort AQ 1 spray in each nostril twice a day as needed. Use the "crossover" technique into opposite nostril spraying toward opposite ear @ 45 degree angle, not straight up into nostril.   Plain Over the Counter Allegra (NOT D )  160 daily , OR  Loratidine 10 mg , OR Zyrtec 10 mg @ bedtime  as needed for itchy eyes & sneezing.  Saline Irrigation and Saline Sprays can also help reduce symptoms.  If emergency symptoms discussed during visit developed, seek medical attention immediately.  Followup as needed, or for worsening or persistent symptoms despite treatment.

## 2014-06-15 NOTE — Progress Notes (Signed)
Pre visit review using our clinic review tool, if applicable. No additional management support is needed unless otherwise documented below in the visit note. 

## 2014-06-15 NOTE — Patient Instructions (Addendum)
Plain Over the Counter Mucinex (NOT Mucinex D) for thick secretions  Try taking over the counter plain Delsym to help your cough symptoms.  Force NON dairy fluids, drinking plenty of water is best.    Over the Counter Flonase OR Nasacort AQ 1 spray in each nostril twice a day as needed. Use the "crossover" technique into opposite nostril spraying toward opposite ear @ 45 degree angle, not straight up into nostril.   Plain Over the Counter Allegra (NOT D )  160 daily , OR Loratidine 10 mg , OR Zyrtec 10 mg @ bedtime  as needed for itchy eyes & sneezing.  Saline Irrigation and Saline Sprays can also help reduce symptoms.  If emergency symptoms discussed during visit developed, seek medical attention immediately.  Followup as needed, or for worsening or persistent symptoms despite treatment.    Upper Respiratory Infection, Adult An upper respiratory infection (URI) is also known as the common cold. It is often caused by a type of germ (virus). Colds are easily spread (contagious). You can pass it to others by kissing, coughing, sneezing, or drinking out of the same glass. Usually, you get better in 1 or 2 weeks.  HOME CARE   Only take medicine as told by your doctor.  Use a warm mist humidifier or breathe in steam from a hot shower.  Drink enough water and fluids to keep your pee (urine) clear or pale yellow.  Get plenty of rest.  Return to work when your temperature is back to normal or as told by your doctor. You may use a face mask and wash your hands to stop your cold from spreading. GET HELP RIGHT AWAY IF:   After the first few days, you feel you are getting worse.  You have questions about your medicine.  You have chills, shortness of breath, or brown or red spit (mucus).  You have yellow or brown snot (nasal discharge) or pain in the face, especially when you bend forward.  You have a fever, puffy (swollen) neck, pain when you swallow, or white spots in the back of your  throat.  You have a bad headache, ear pain, sinus pain, or chest pain.  You have a high-pitched whistling sound when you breathe in and out (wheezing).  You have a lasting cough or cough up blood.  You have sore muscles or a stiff neck. MAKE SURE YOU:   Understand these instructions.  Will watch your condition.  Will get help right away if you are not doing well or get worse. Document Released: 02/06/2008 Document Revised: 11/12/2011 Document Reviewed: 11/25/2013 [facility] Patient Information 2015 [place], [place]. This information is not intended to replace advice given to you by your health care provider. Make sure you discuss any questions you have with your health care provider.

## 2014-06-17 ENCOUNTER — Telehealth: Payer: Self-pay | Admitting: Internal Medicine

## 2014-06-17 ENCOUNTER — Other Ambulatory Visit: Payer: Self-pay | Admitting: Physician Assistant

## 2014-06-17 DIAGNOSIS — J069 Acute upper respiratory infection, unspecified: Secondary | ICD-10-CM

## 2014-06-17 MED ORDER — BENZONATATE 100 MG PO CAPS: 100.0000 mg | ORAL_CAPSULE | Freq: Three times a day (TID) | ORAL | Status: DC | PRN

## 2014-06-17 NOTE — Telephone Encounter (Signed)
Pt said she is not able to sleep at night do to coughing. Pt would like to know if something can be called in for her cough.    Pharmacy;  [facility]

## 2014-06-17 NOTE — Telephone Encounter (Signed)
Called and spoke with pt and pt is aware.  

## 2014-06-17 NOTE — Telephone Encounter (Signed)
Please let pt know that we sent in Tessalon perles to her pharmacy. She can take this every 8 hours as needed.

## 2014-06-19 ENCOUNTER — Other Ambulatory Visit: Payer: Self-pay | Admitting: Internal Medicine

## 2014-06-23 ENCOUNTER — Ambulatory Visit (INDEPENDENT_AMBULATORY_CARE_PROVIDER_SITE_OTHER): Payer: Medicare Other | Admitting: Family Medicine

## 2014-06-23 ENCOUNTER — Encounter: Payer: Self-pay | Admitting: Family Medicine

## 2014-06-23 VITALS — BP 124/72 | HR 72 | Temp 98.0°F | Wt 139.0 lb

## 2014-06-23 DIAGNOSIS — E032 Hypothyroidism due to medicaments and other exogenous substances: Secondary | ICD-10-CM

## 2014-06-23 DIAGNOSIS — F329 Major depressive disorder, single episode, unspecified: Secondary | ICD-10-CM

## 2014-06-23 DIAGNOSIS — F32A Depression, unspecified: Secondary | ICD-10-CM

## 2014-06-23 DIAGNOSIS — I10 Essential (primary) hypertension: Secondary | ICD-10-CM

## 2014-06-23 DIAGNOSIS — G4700 Insomnia, unspecified: Secondary | ICD-10-CM | POA: Insufficient documentation

## 2014-06-23 DIAGNOSIS — E785 Hyperlipidemia, unspecified: Secondary | ICD-10-CM

## 2014-06-23 NOTE — Assessment & Plan Note (Signed)
Continue Zoloft as well controlled

## 2014-06-23 NOTE — Patient Instructions (Addendum)
Great to meet you!   Let's do a physical in 6 months with bloodwork (so come fasting).   Come back for flu at least a week out from illness.

## 2014-06-23 NOTE — Assessment & Plan Note (Signed)
Continue levothyroxine 112 mcg as well controlled

## 2014-06-23 NOTE — Progress Notes (Signed)
Caroline Reddish, MD Phone: [phone]  Subjective:  Patient presents today to establish care with me as their new primary care provider. Patient was formerly a patient of Dr. Leanne Chang. Chief complaint-noted.   Hypothyroidism-well-controlled on levothyroxine 112 mcg ROS-No hair or nail changes. No heat/cold intolerance. No constipation or diarrhea. Denies shakiness or anxiety.   Hyperlipidemia-controlled with LDL 85 in 2014. On statin: Crestor 10 mg ROS- no chest pain or shortness of breath. No myalgias  Hypertension-controlled on lisinopril hydrochlorothiazide BP Readings from Last 3 Encounters:  06/23/14 124/72  06/15/14 122/78  03/30/14 124/73  ROS-Denies any CP, HA, SOB, blurry vision, LE edema  Depression-controlled on Zoloft ROS-no SI HI  The following were reviewed and entered/updated in epic: Past Medical History  Diagnosis Date  . DEPRESSION 04/16/2007  . HYPOTHYROIDISM 04/16/2007  . Osteopenia 06/2013    T score -2.0 FRAX 8.8%/0.8% stable from prior DEXA 2012  . Hypothyroid   . Cancer     Melanoma  . HA (headache)   . Fibroadenoma of breast    Patient Active Problem List   Diagnosis Date Noted  . Insomnia 06/23/2014    Priority: Medium  . HA (headache)     Priority: Medium  . Essential hypertension, benign 03/03/2014    Priority: Medium  . Melanoma     Priority: Medium  . Hyperlipidemia 12/11/2011    Priority: Medium  . Hypothyroid     Priority: Medium  . Depression 04/16/2007    Priority: Medium  . Osteopenia     Priority: Low   Past Surgical History  Procedure Laterality Date  . Excision of melanoma  1978  . Hysteroscopy  1988    HYSTEROSCOPY,D&C  . Breast surgery      bx left breast- and removal of fibroadenoma  . Mass excision Right 02/23/2014    Procedure: BIOPSY TEMPORAL ARTERY RIGHT;  Surgeon: Rozetta Nunnery, MD;  Location: [facility];  Service: ENT;  Laterality: Right;    Family History  Problem Relation Age of  Onset  . Diabetes Mother   . Heart disease Mother 56  . Dementia Mother   . Diabetes Father   . Heart disease Father 23  . Cancer Father     Skin cancer-led to death  . Breast cancer Maternal Grandmother     Age 70  . Stomach cancer Neg Hx   . Colon cancer Neg Hx     Medications- reviewed and updated Current Outpatient Prescriptions  Medication Sig Dispense Refill  . Calcium Carbonate-Vitamin D (CALTRATE 600+D) 600-400 MG-UNIT per tablet Take 2 tablets by mouth daily.        . CRESTOR 10 MG tablet TAKE 1 TABLET ONCE DAILY.  30 tablet  0  . gabapentin (NEURONTIN) 100 MG capsule Take 200 mg by mouth 2 (two) times daily.      Marland Kitchen levothyroxine (SYNTHROID, LEVOTHROID) 112 MCG tablet TAKE 1 TABLET ONCE DAILY.  30 tablet  0  . lisinopril-hydrochlorothiazide (PRINZIDE,ZESTORETIC) 20-25 MG per tablet Take 1 tablet by mouth daily.  90 tablet  3  . sertraline (ZOLOFT) 100 MG tablet Take 100 mg by mouth daily.       . temazepam (RESTORIL) 15 MG capsule TAKE 1 CAPSULE AT BEDTIME AS NEEDED.  30 capsule  0   No current facility-administered medications for this visit.    Allergies-reviewed and updated Allergies  Allergen Reactions  . Compazine     extrapyramidal syndrome  . Prednisone     Mood swings -  crying     History   Social History  . Marital Status: Married    Spouse Name: [name]    Number of Children: N/A  . Years of Education: college   Social History Main Topics  . Smoking status: Never Smoker   . Smokeless tobacco: Never Used  . Alcohol Use: 7.0 oz/week    14 drink(s) per week     Comment: wine daily  . Drug Use: No  . Sexual Activity: Yes    Birth Control/ Protection: Post-menopausal   Other Topics Concern  . None   Social History Narrative   Patient lives at home with her husband [name]). 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      Education : 2 years  [employer], 2 years [employer]      Hobbies: grandchildren time, garden, reading, [employer]      Right handed. Caffeine one cup daily    ROS--See HPI   Objective: BP 124/72  Pulse 72  Temp(Src) 98 F (36.7 C)  Wt 139 lb (63.05 kg) Gen: NAD, resting comfortably in chair HEENT: Mucous membranes are moist. Oropharynx normal Neck: no thyromegaly CV: RRR no murmurs rubs or gallops Lungs: CTAB no crackles, wheeze, rhonchi Abdomen: soft/nontender/nondistended/normal bowel sounds.  Ext: no edema Skin: warm, dry Neuro: grossly normal, moves all extremities, PERRLA   Assessment/Plan:  Hypothyroid Continue levothyroxine 112 mcg as well controlled  Hyperlipidemia Continue Crestor 10 mg as well controlled  Essential hypertension, benign Continue lisinopril and hydrochlorothiazide as well controlled  Depression Continue Zoloft as well controlled

## 2014-06-23 NOTE — Assessment & Plan Note (Signed)
Continue lisinopril and hydrochlorothiazide as well controlled

## 2014-06-23 NOTE — Assessment & Plan Note (Signed)
Continue Crestor 10 mg as well controlled

## 2014-06-30 ENCOUNTER — Other Ambulatory Visit: Payer: Self-pay | Admitting: Internal Medicine

## 2014-06-30 ENCOUNTER — Ambulatory Visit: Payer: Medicare Other

## 2014-07-01 ENCOUNTER — Encounter: Payer: Medicare Other | Admitting: Gynecology

## 2014-07-05 ENCOUNTER — Encounter: Payer: Self-pay | Admitting: Family Medicine

## 2014-07-07 ENCOUNTER — Ambulatory Visit (INDEPENDENT_AMBULATORY_CARE_PROVIDER_SITE_OTHER): Payer: Medicare Other | Admitting: *Deleted

## 2014-07-07 ENCOUNTER — Ambulatory Visit: Payer: Medicare Other

## 2014-07-07 DIAGNOSIS — Z23 Encounter for immunization: Secondary | ICD-10-CM

## 2014-07-23 ENCOUNTER — Other Ambulatory Visit: Payer: Self-pay | Admitting: Internal Medicine

## 2014-07-26 NOTE — Telephone Encounter (Signed)
Medication faxed to [facility]

## 2014-07-26 NOTE — Telephone Encounter (Signed)
yes

## 2014-07-26 NOTE — Telephone Encounter (Signed)
Is this ok to refill?  

## 2014-07-27 ENCOUNTER — Telehealth: Payer: Self-pay | Admitting: Neurology

## 2014-07-27 NOTE — Telephone Encounter (Signed)
Left message for patient regarding rescheduling 10/04/14 appointment time per Felix's schedule, moved appointment to be earlier.

## 2014-08-22 ENCOUNTER — Other Ambulatory Visit: Payer: Self-pay | Admitting: Family Medicine

## 2014-08-23 ENCOUNTER — Other Ambulatory Visit: Payer: Self-pay | Admitting: Family Medicine

## 2014-08-23 MED ORDER — TEMAZEPAM 15 MG PO CAPS: 15.0000 mg | ORAL_CAPSULE | Freq: Every evening | ORAL | Status: DC | PRN

## 2014-08-23 NOTE — Telephone Encounter (Signed)
Medication called in 

## 2014-08-23 NOTE — Telephone Encounter (Signed)
Is this ok to refill?  

## 2014-08-23 NOTE — Telephone Encounter (Signed)
5 refills please.

## 2014-08-29 ENCOUNTER — Other Ambulatory Visit: Payer: Self-pay | Admitting: Family Medicine

## 2014-09-02 ENCOUNTER — Ambulatory Visit (INDEPENDENT_AMBULATORY_CARE_PROVIDER_SITE_OTHER): Payer: Medicare Other | Admitting: Gynecology

## 2014-09-02 ENCOUNTER — Encounter: Payer: Self-pay | Admitting: Gynecology

## 2014-09-02 VITALS — BP 124/78 | Wt 134.0 lb

## 2014-09-02 DIAGNOSIS — M8580 Other specified disorders of bone density and structure, unspecified site: Secondary | ICD-10-CM

## 2014-09-02 DIAGNOSIS — N952 Postmenopausal atrophic vaginitis: Secondary | ICD-10-CM

## 2014-09-02 DIAGNOSIS — D251 Intramural leiomyoma of uterus: Secondary | ICD-10-CM

## 2014-09-02 NOTE — Patient Instructions (Signed)
You may obtain a copy of any labs that were done today by logging onto MyChart as outlined in the instructions provided with your AVS (after visit summary). The office will not call with normal lab results but certainly if there are any significant abnormalities then we will contact you.   Health Maintenance, Female A healthy lifestyle and preventative care can promote health and wellness.  Maintain regular health, dental, and eye exams.  Eat a healthy diet. Foods like vegetables, fruits, whole grains, low-fat dairy products, and lean protein foods contain the nutrients you need without too many calories. Decrease your intake of foods high in solid fats, added sugars, and salt. Get information about a proper diet from your caregiver, if necessary.  Regular physical exercise is one of the most important things you can do for your health. Most adults should get at least 150 minutes of moderate-intensity exercise (any activity that increases your heart rate and causes you to sweat) each week. In addition, most adults need muscle-strengthening exercises on 2 or more days a week.   Maintain a healthy weight. The body mass index (BMI) is a screening tool to identify possible weight problems. It provides an estimate of body fat based on height and weight. Your caregiver can help determine your BMI, and can help you achieve or maintain a healthy weight. For adults 20 years and older:  A BMI below 18.5 is considered underweight.  A BMI of 18.5 to 24.9 is normal.  A BMI of 25 to 29.9 is considered overweight.  A BMI of 30 and above is considered obese.  Maintain normal blood lipids and cholesterol by exercising and minimizing your intake of saturated fat. Eat a balanced diet with plenty of fruits and vegetables. Blood tests for lipids and cholesterol should begin at age 61 and be repeated every 5 years. If your lipid or cholesterol levels are high, you are over 50, or you are a high risk for heart  disease, you may need your cholesterol levels checked more frequently.Ongoing high lipid and cholesterol levels should be treated with medicines if diet and exercise are not effective.  If you smoke, find out from your caregiver how to quit. If you do not use tobacco, do not start.  Lung cancer screening is recommended for adults aged 33 80 years who are at high risk for developing lung cancer because of a history of smoking. Yearly low-dose computed tomography (CT) is recommended for people who have at least a 30-pack-year history of smoking and are a current smoker or have quit within the past 15 years. A pack year of smoking is smoking an average of 1 pack of cigarettes a day for 1 year (for example: 1 pack a day for 30 years or 2 packs a day for 15 years). Yearly screening should continue until the smoker has stopped smoking for at least 15 years. Yearly screening should also be stopped for people who develop a health problem that would prevent them from having lung cancer treatment.  If you are pregnant, do not drink alcohol. If you are breastfeeding, be very cautious about drinking alcohol. If you are not pregnant and choose to drink alcohol, do not exceed 1 drink per day. One drink is considered to be 12 ounces (355 mL) of beer, 5 ounces (148 mL) of wine, or 1.5 ounces (44 mL) of liquor.  Avoid use of street drugs. Do not share needles with anyone. Ask for help if you need support or instructions about stopping  the use of drugs.  High blood pressure causes heart disease and increases the risk of stroke. Blood pressure should be checked at least every 1 to 2 years. Ongoing high blood pressure should be treated with medicines, if weight loss and exercise are not effective.  If you are 59 to 69 years old, ask your caregiver if you should take aspirin to prevent strokes.  Diabetes screening involves taking a blood sample to check your fasting blood sugar level. This should be done once every 3  years, after age 91, if you are within normal weight and without risk factors for diabetes. Testing should be considered at a younger age or be carried out more frequently if you are overweight and have at least 1 risk factor for diabetes.  Breast cancer screening is essential preventative care for women. You should practice "breast self-awareness." This means understanding the normal appearance and feel of your breasts and may include breast self-examination. Any changes detected, no matter how small, should be reported to a caregiver. Women in their 66s and 30s should have a clinical breast exam (CBE) by a caregiver as part of a regular health exam every 1 to 3 years. After age 101, women should have a CBE every year. Starting at age 100, women should consider having a mammogram (breast X-ray) every year. Women who have a family history of breast cancer should talk to their caregiver about genetic screening. Women at a high risk of breast cancer should talk to their caregiver about having an MRI and a mammogram every year.  Breast cancer gene (BRCA)-related cancer risk assessment is recommended for women who have family members with BRCA-related cancers. BRCA-related cancers include breast, ovarian, tubal, and peritoneal cancers. Having family members with these cancers may be associated with an increased risk for harmful changes (mutations) in the breast cancer genes BRCA1 and BRCA2. Results of the assessment will determine the need for genetic counseling and BRCA1 and BRCA2 testing.  The Pap test is a screening test for cervical cancer. Women should have a Pap test starting at age 57. Between ages 25 and 35, Pap tests should be repeated every 2 years. Beginning at age 37, you should have a Pap test every 3 years as long as the past 3 Pap tests have been normal. If you had a hysterectomy for a problem that was not cancer or a condition that could lead to cancer, then you no longer need Pap tests. If you are  between ages 50 and 76, and you have had normal Pap tests going back 10 years, you no longer need Pap tests. If you have had past treatment for cervical cancer or a condition that could lead to cancer, you need Pap tests and screening for cancer for at least 20 years after your treatment. If Pap tests have been discontinued, risk factors (such as a new sexual partner) need to be reassessed to determine if screening should be resumed. Some women have medical problems that increase the chance of getting cervical cancer. In these cases, your caregiver may recommend more frequent screening and Pap tests.  The human papillomavirus (HPV) test is an additional test that may be used for cervical cancer screening. The HPV test looks for the virus that can cause the cell changes on the cervix. The cells collected during the Pap test can be tested for HPV. The HPV test could be used to screen women aged 44 years and older, and should be used in women of any age  who have unclear Pap test results. After the age of 55, women should have HPV testing at the same frequency as a Pap test.  Colorectal cancer can be detected and often prevented. Most routine colorectal cancer screening begins at the age of 44 and continues through age 20. However, your caregiver may recommend screening at an earlier age if you have risk factors for colon cancer. On a yearly basis, your caregiver may provide home test kits to check for hidden blood in the stool. Use of a small camera at the end of a tube, to directly examine the colon (sigmoidoscopy or colonoscopy), can detect the earliest forms of colorectal cancer. Talk to your caregiver about this at age 86, when routine screening begins. Direct examination of the colon should be repeated every 5 to 10 years through age 13, unless early forms of pre-cancerous polyps or small growths are found.  Hepatitis C blood testing is recommended for all people born from [birth year] through 1966 and any  individual with known risks for hepatitis C.  Practice safe sex. Use condoms and avoid high-risk sexual practices to reduce the spread of sexually transmitted infections (STIs). Sexually active women aged 36 and younger should be checked for Chlamydia, which is a common sexually transmitted infection. Older women with new or multiple partners should also be tested for Chlamydia. Testing for other STIs is recommended if you are sexually active and at increased risk.  Osteoporosis is a disease in which the bones lose minerals and strength with aging. This can result in serious bone fractures. The risk of osteoporosis can be identified using a bone density scan. Women ages 20 and over and women at risk for fractures or osteoporosis should discuss screening with their caregivers. Ask your caregiver whether you should be taking a calcium supplement or vitamin D to reduce the rate of osteoporosis.  Menopause can be associated with physical symptoms and risks. Hormone replacement therapy is available to decrease symptoms and risks. You should talk to your caregiver about whether hormone replacement therapy is right for you.  Use sunscreen. Apply sunscreen liberally and repeatedly throughout the day. You should seek shade when your shadow is shorter than you. Protect yourself by wearing long sleeves, pants, a wide-brimmed hat, and sunglasses year round, whenever you are outdoors.  Notify your caregiver of new moles or changes in moles, especially if there is a change in shape or color. Also notify your caregiver if a mole is larger than the size of a pencil eraser.  Stay current with your immunizations. Document Released: 03/05/2011 Document Revised: 12/15/2012 Document Reviewed: 03/05/2011 [facility] Patient Information 2014 [employer].

## 2014-09-02 NOTE — Progress Notes (Signed)
Caroline Peters Feb 19, 1946 [id number]        68 y.o.  G2P2002 for follow up exam  Past medical history,surgical history, problem list, medications, allergies, family history and social history were all reviewed and documented as reviewed in the EPIC chart.  ROS:  Performed with pertinent positives and negatives included in the history, assessment and plan.   Additional significant findings :  none   Exam: [name] Vitals:   09/02/14 1543  BP: 124/78  Weight: 134 lb (60.782 kg)   General appearance:  Normal affect, orientation and appearance. Skin: Grossly normal HEENT: Without gross lesions.  No cervical or supraclavicular adenopathy. Thyroid normal.  Lungs:  Clear without wheezing, rales or rhonchi Cardiac: RR, without RMG Abdominal:  Soft, nontender, without masses, guarding, rebound, organomegaly or hernia Breasts:  Examined lying and sitting without masses, retractions, discharge or axillary adenopathy. Pelvic:  Ext/BUS/vagina with generalized atrophic changes  Cervix with atrophic changes  Uterus axial to anteverted, normal size, shape and contour, midline and mobile nontender   Adnexa  Without masses or tenderness    Anus and perineum  Normal   Rectovaginal  Normal sphincter tone without palpated masses or tenderness.    Assessment/Plan:  68 y.o. [id number] female for follow up exam.   1. Postmenopausal/atrophic genital changes. Patient without significant symptoms of hot flashes, night sweats, vaginal dryness, vaginal bleeding at all. Continue to monitor. Report any vaginal bleeding. 2. Osteopenia.  DEXA 06/2013 T score -2.0. FRAX 8.8%/0.8%. Stable from prior DEXA 2012. Increased calcium and vitamin D reviewed. Plan repeat DEXA next year at two-year interval. 3. 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 Exam today shows uterus normal size. Continue to monitor with annual exams. 4. Pap smear 2012. No Pap smear done  today. No history of significant abnormal Pap smears. Reviewed current screening guidelines. We both feel comfortable stop screening as she is over the age of 57. 5. Mammography 02/2014. Continue with annual mammography. SBE monthly reviewed. 6. Colonoscopy 2013. Repeat at their recommended interval. 7. Health maintenance. No routine blood work done as she has this done at her primary physician's office. Follow up 1 year, sooner as needed.     Anastasio Auerbach MD, 4:07 PM 09/02/2014

## 2014-09-03 LAB — URINALYSIS W MICROSCOPIC + REFLEX CULTURE
BACTERIA UA: NONE SEEN
BILIRUBIN URINE: NEGATIVE
CASTS: NONE SEEN
Crystals: NONE SEEN
GLUCOSE, UA: NEGATIVE mg/dL
HGB URINE DIPSTICK: NEGATIVE
KETONES UR: NEGATIVE mg/dL
NITRITE: NEGATIVE
Protein, ur: NEGATIVE mg/dL
SPECIFIC GRAVITY, URINE: 1.013 (ref 1.005–1.030)
Squamous Epithelial / LPF: NONE SEEN
Urobilinogen, UA: 0.2 mg/dL (ref 0.0–1.0)
pH: 6.5 (ref 5.0–8.0)

## 2014-09-04 ENCOUNTER — Other Ambulatory Visit: Payer: Self-pay | Admitting: Internal Medicine

## 2014-09-04 LAB — URINE CULTURE

## 2014-09-09 ENCOUNTER — Telehealth: Payer: Self-pay | Admitting: *Deleted

## 2014-09-09 NOTE — Telephone Encounter (Signed)
Pt had questions about AVS, all questions answered.

## 2014-10-04 ENCOUNTER — Ambulatory Visit: Payer: Medicare Other | Admitting: Nurse Practitioner

## 2014-10-04 ENCOUNTER — Telehealth: Payer: Self-pay | Admitting: Family Medicine

## 2014-10-04 NOTE — Telephone Encounter (Signed)
PA submitted #[id number]

## 2014-10-04 NOTE — Telephone Encounter (Signed)
Pt received insurance letter that temazepam (RESTORIL) 15 MG capsule will no longer be covered. [employer]

## 2014-10-15 ENCOUNTER — Telehealth: Payer: Self-pay | Admitting: Family Medicine

## 2014-10-15 MED ORDER — ZOLPIDEM TARTRATE 5 MG PO TABS: ORAL_TABLET | ORAL | Status: DC

## 2014-10-15 NOTE — Telephone Encounter (Signed)
Pt med temazepam is not long cover by [employer].  Pt can try zaleplon,zolpidem. [place]

## 2014-10-15 NOTE — Telephone Encounter (Signed)
Please send rx for ambien 2.5-5mg  qhs prn #[phone] refills

## 2014-10-15 NOTE — Telephone Encounter (Signed)
Rx called in and Left message on machine for patient regarding the change in medication.

## 2014-11-02 ENCOUNTER — Telehealth: Payer: Self-pay | Admitting: Family Medicine

## 2014-11-02 NOTE — Telephone Encounter (Signed)
Please schedule pt appt.  

## 2014-11-02 NOTE — Telephone Encounter (Signed)
Pt states that Dr Leanne Chang used to give her Xanax in small intervals as needed. She would like a Rx called in in to [facility] if Dr Yong Channel agrees. She knows she may need to make an appt first.

## 2014-11-04 ENCOUNTER — Encounter: Payer: Self-pay | Admitting: Family Medicine

## 2014-11-04 ENCOUNTER — Ambulatory Visit (INDEPENDENT_AMBULATORY_CARE_PROVIDER_SITE_OTHER): Payer: Medicare Other | Admitting: Family Medicine

## 2014-11-04 VITALS — BP 124/80 | Temp 98.0°F | Wt 134.0 lb

## 2014-11-04 DIAGNOSIS — F329 Major depressive disorder, single episode, unspecified: Secondary | ICD-10-CM

## 2014-11-04 DIAGNOSIS — F32A Depression, unspecified: Secondary | ICD-10-CM

## 2014-11-04 MED ORDER — ALPRAZOLAM 0.5 MG PO TABS: ORAL_TABLET | ORAL | Status: DC

## 2014-11-04 NOTE — Progress Notes (Signed)
  Garret Reddish, MD Phone: [phone]  Subjective:   Caroline Peters is a 69 y.o. year old very pleasant female patient who presents with the following:  Depression and anxiety -A lot of personal problems. Married 47 years. Almost 4 years ago found out husband unfaithful for 31 years. Has been in therapy with Dr. Kathe Becton but changed insurance companies and had to stop. Very stressful 3-4 months.   Anxiety component and feels like has a very short fuse. Depression portion remains controlled. Having xanax on hand has been very helpful. Last given #30 in 2014  ROS- No SI/HI. States depression piece controlled with no anhedonia or depressed mood  Past Medical History- in osmnia, HTN, hx melanoma, HLD, hypothyroid, depression, osteopenia  Medications- reviewed and updated Current Outpatient Prescriptions  Medication Sig Dispense Refill  . Calcium Carbonate-Vitamin D (CALTRATE 600+D) 600-400 MG-UNIT per tablet Take 2 tablets by mouth daily.      . CRESTOR 10 MG tablet TAKE 1 TABLET ONCE DAILY. 30 tablet 3  . levothyroxine (SYNTHROID, LEVOTHROID) 112 MCG tablet TAKE 1 TABLET ONCE DAILY. 30 tablet 3  . lisinopril-hydrochlorothiazide (PRINZIDE,ZESTORETIC) 20-25 MG per tablet Take 1 tablet by mouth daily. 90 tablet 3  . sertraline (ZOLOFT) 100 MG tablet TAKE 1 & 1/2 TABLETS A DAY. 135 tablet 1  . zolpidem (AMBIEN) 5 MG tablet Half to one tablet at bedtime as needed for sleep (Patient not taking: Reported on 11/04/2014) 30 tablet 5   No current facility-administered medications for this visit.   Objective: BP 124/80 mmHg  Temp(Src) 98 F (36.7 C) (Oral)  Wt 134 lb (60.782 kg) Gen: NAD, resting comfortably Psych: tearful at times when discussing past   Assessment/Plan:  Depression Reasonable control of depression on zoloft 100mg . Also an anxiety portion-Patient has recurrent issues when she is more stressed and when thinking about infidelity of husband 4 years ago (had been cheating  over 30 years). I gave patient a refill on xanax #30 but I asked her to seek out counseling as well. Counseled patient during visit as well.     Strict Return precautions advised. Needs TSH update especially with anxiety.   Meds ordered this encounter  Medications  . ALPRAZolam (XANAX) 0.5 MG tablet    Sig: 1/2 to 1 tablet bid prn    Dispense:  30 tablet    Refill:  0  . sertraline (ZOLOFT) 100 MG tablet    Sig: Take 100 mg by mouth daily.   >50% of 15 minute office visit was spent on counseling (regarding depression and anxiety) and coordination of care

## 2014-11-04 NOTE — Patient Instructions (Signed)
Thank you for sharing a bit more of your story with me.   Continue on zoloft 100mg .   I would encourage you to touch base with your former psychologist or see someone in [place] group such as Irven Shelling, our new psychologist in this office. Call # on sheet.   Xanax sparingly as needed for some of the more difficult times.

## 2014-11-04 NOTE — Assessment & Plan Note (Addendum)
Reasonable control of depression on zoloft 100mg . Also an anxiety portion-Patient has recurrent issues when she is more stressed and when thinking about infidelity of husband 4 years ago (had been cheating over 30 years). I gave patient a refill on xanax #30 but I asked her to seek out counseling as well. Counseled patient during visit as well.

## 2014-11-15 NOTE — Telephone Encounter (Signed)
PA approved.

## 2014-11-15 NOTE — Telephone Encounter (Signed)
Below note was entered in error. Please disregard.

## 2014-11-17 ENCOUNTER — Ambulatory Visit (INDEPENDENT_AMBULATORY_CARE_PROVIDER_SITE_OTHER): Payer: Medicare Other | Admitting: Family Medicine

## 2014-11-17 ENCOUNTER — Encounter: Payer: Self-pay | Admitting: Family Medicine

## 2014-11-17 VITALS — BP 110/72 | HR 66 | Temp 98.3°F | Wt 133.0 lb

## 2014-11-17 DIAGNOSIS — R61 Generalized hyperhidrosis: Secondary | ICD-10-CM

## 2014-11-17 DIAGNOSIS — R42 Dizziness and giddiness: Secondary | ICD-10-CM

## 2014-11-17 DIAGNOSIS — R634 Abnormal weight loss: Secondary | ICD-10-CM

## 2014-11-17 LAB — CBC
HCT: 36.8 % (ref 36.0–46.0)
HEMOGLOBIN: 12.6 g/dL (ref 12.0–15.0)
MCHC: 34.1 g/dL (ref 30.0–36.0)
MCV: 90.1 fl (ref 78.0–100.0)
Platelets: 202 10*3/uL (ref 150.0–400.0)
RBC: 4.08 Mil/uL (ref 3.87–5.11)
RDW: 14 % (ref 11.5–15.5)
WBC: 3.7 10*3/uL — AB (ref 4.0–10.5)

## 2014-11-17 LAB — COMPREHENSIVE METABOLIC PANEL
ALK PHOS: 59 U/L (ref 39–117)
ALT: 17 U/L (ref 0–35)
AST: 18 U/L (ref 0–37)
Albumin: 4.8 g/dL (ref 3.5–5.2)
BUN: 23 mg/dL (ref 6–23)
CO2: 31 mEq/L (ref 19–32)
Calcium: 10.7 mg/dL — ABNORMAL HIGH (ref 8.4–10.5)
Chloride: 104 mEq/L (ref 96–112)
Creatinine, Ser: 0.91 mg/dL (ref 0.40–1.20)
GFR: 65.09 mL/min (ref >60.00)
Glucose, Bld: 83 mg/dL (ref 70–99)
Potassium: 4.9 mEq/L (ref 3.5–5.1)
Sodium: 139 mEq/L (ref 135–145)
TOTAL PROTEIN: 7.1 g/dL (ref 6.0–8.3)
Total Bilirubin: 0.5 mg/dL (ref 0.2–1.2)

## 2014-11-17 LAB — TSH: TSH: 0.1 u[IU]/mL — ABNORMAL LOW (ref 0.35–4.50)

## 2014-11-17 LAB — SEDIMENTATION RATE: Sed Rate: 16 mm/hr (ref 0–22)

## 2014-11-17 NOTE — Addendum Note (Signed)
Addended by: Joyce Gross R on: 11/17/2014 05:01 PM   Modules accepted: Orders

## 2014-11-17 NOTE — Patient Instructions (Addendum)
We are trying to set you up with a follow up with Dr. Krista Blue within the week. Please journal your symptoms.   Labs today  2 hour glucose tolerance test at [facility]-  Please call [phone]  Your appt with Dr. Krista Blue is Monday November 22, 2014 at 10:15.

## 2014-11-17 NOTE — Progress Notes (Signed)
Garret Reddish, MD Phone: [phone]  Subjective:   Caroline Peters is a 69 y.o. year old very pleasant female patient who presents with the following:  Dizzy/unsteady/severe headaches Over last 6 days she has had 3 episodes after lunch where "everything turns white", she becomes dizzy and unsteady on her feed and gets severe diffuse headache. Lasts 15-20 minutes then resolves. She states though everything is white in appearance, she can still see normal shapes and figures and has no loss of vision field. She states she has been told her pupils get small. She denies caffeine use related to this. No history of similar episodes. She denies chest pain, shortness of breath, nausea, vomiting, left arm or neck pain with these episodes. No palpitations at all in recent days. Episodes always after eating  Denies recent stressors ain fact states stress has been lower and she has been trying ot titrate off of zoloft taking 50 and 169m on alternating days. Has not had to take any xanax.   She also experiences a stabbing back pain lasting <5 minutes each of the last 6 nights. Denies ches tpain or shortness of breath with these episodes as well  Finally mentions weight loss over last year and review of chart shows 16 lbs in a year, 7 lbs over 6 months but only 1 lb over 3 months. She is not trying to lose weight and is not exercising. She is 2.5 years out from colonoscopy with advised 3 year follow up. Up to date on mammogram and denies breast lumps. Normal pap smear at age 69 She does have a history of headache that required temporal artery biopsy but was reportedly negative for temporal arteritis. Was on gabapentin for some period but now off. History of melanoma but sees derm regularly. Denies history of migraine. Sed rate from about a year ago was <10.   ROS- extensive ROS noted above. Additional note of night sweats over last 6 nights.   Past Medical History- depression, insomnia, HTN,  hypothyroidism,   Medications- reviewed and updated Current Outpatient Prescriptions  Medication Sig Dispense Refill  . Calcium Carbonate-Vitamin D (CALTRATE 600+D) 600-400 MG-UNIT per tablet Take 2 tablets by mouth daily.      . CRESTOR 10 MG tablet TAKE 1 TABLET ONCE DAILY. 30 tablet 3  . levothyroxine (SYNTHROID, LEVOTHROID) 112 MCG tablet TAKE 1 TABLET ONCE DAILY. 30 tablet 3  . lisinopril-hydrochlorothiazide (PRINZIDE,ZESTORETIC) 20-25 MG per tablet Take 1 tablet by mouth daily. 90 tablet 3  . sertraline (ZOLOFT) 100 MG tablet Take 100 mg by mouth daily.    .Marland Kitchenzolpidem (AMBIEN) 5 MG tablet Half to one tablet at bedtime as needed for sleep 30 tablet 5  . ALPRAZolam (XANAX) 0.5 MG tablet 1/2 to 1 tablet bid prn (Patient not taking: Reported on 11/17/2014) 30 tablet 0   Objective: BP 110/72 mmHg  Pulse 66  Temp(Src) 98.3 F (36.8 C)  Wt 133 lb (60.328 kg) Gen: NAD, resting comfortably in chair HEENT: NCAT. PERRLA. Mucous membranes are moist. CV: RRR no mrg  Lungs: CTAB  Abd: soft/nontender/nondistended/normal bowel sounds  MSK: moves all extremities, no edema  Skin: warm and dry, no rash  Neuro: CN II-XII intact, sensation and reflexes normal throughout, 5/5 muscle strength in bilateral upper and lower extremities. Normal finger to nose. Normal rapid alternating movements. Normal romberg.   Orthostatics also negative   Assessment/Plan:  Dizzy/unsteady/severe headaches episodes including change in visual field to "all white" (postprandial 3 of last 6 days) Orthostatics negative.  Does not sound like orthostatic issue either.  Neurological exam normal. Could possibly be atypical migraine and have referred her back to her neurologist for further evaluation Potential reactive hypoglycemia-check 2 hour postprandial glucose Update thyroid, check cbc, cmet Eye exam normal and think neuro likely more helpful with episodes than optho.  Could be cardiac but does not sound like CAD/ACS and  with no palpitations i doubt arhythmia. Could consider EKG when we follow up or cardiac monitor or cards referral.   With unintentional weight loss 16 lbs over year, 7 lbs over 6 months, 1 lb over 3 months-see HPI for malignancy risk but I doubt. Also with night sweats over 6 nights and stabbing back pain once a night short lived. Could consider CT abdomen and pelvis if above workup unrevealing. Also will check ESR today.   Orders Placed This Encounter  Procedures  . Glucose Tolerance, 2 Hours w/1 Hour    Standing Status: Future     Number of Occurrences:      Standing Expiration Date: 11/17/2015  . TSH    [place]  . Comprehensive metabolic panel    [place]  . CBC     [place]  . Sedimentation rate    [place]   Strict ED/911 precautions given

## 2014-11-18 ENCOUNTER — Other Ambulatory Visit: Payer: Self-pay

## 2014-11-18 LAB — T3, FREE: T3, Free: 3.2 pg/mL (ref 2.3–4.2)

## 2014-11-18 LAB — T4, FREE: FREE T4: 1.13 ng/dL (ref 0.60–1.60)

## 2014-11-18 MED ORDER — LISINOPRIL 40 MG PO TABS: 40.0000 mg | ORAL_TABLET | Freq: Every day | ORAL | Status: DC

## 2014-11-22 ENCOUNTER — Encounter: Payer: Self-pay | Admitting: Neurology

## 2014-11-22 ENCOUNTER — Ambulatory Visit (INDEPENDENT_AMBULATORY_CARE_PROVIDER_SITE_OTHER): Payer: Medicare Other | Admitting: Neurology

## 2014-11-22 VITALS — BP 108/66 | HR 68 | Ht 63.0 in | Wt 135.0 lb

## 2014-11-22 DIAGNOSIS — G44219 Episodic tension-type headache, not intractable: Secondary | ICD-10-CM | POA: Diagnosis not present

## 2014-11-22 NOTE — Progress Notes (Signed)
PATIENT: Caroline Peters DOB: July 19, 1946  HISTORICAL  Caroline Peters is a 69 right-handed female, referred by her optometrist Dr. Melissa Noon, and her primary care physician Dr. Phoebe Sharps for evaluation of headaches  She is going through a lot of stress over the past 3 years, has past medical history of hypothyroidism, on supplement, hyperlipidemia, denies a previous history of migraines,  She presented with right parietal area pain since June18th, ice cream  frozen pain, sharp, transient, but with residual dull achy pain lasting for 20 minutes, multiple episodes in a day  She denies visual loss, no lateralized motor or sensory deficit, was treated with prednisone 40 mg every day for a while, there was no significant improvement, she complains of unbearable side effect, including moodiness,  She also had right temporal artery biopsy, which showed no evidence of temporal arteritis, laboratory evaluation showed normal TSH, ESR was 9 in February 2015  She has tried over-the-counter ibuprofen, which has been helpful   UPDATE July 28th 2015:  She is taking Gabapentin 150m tid, which has been very helpful, she has no significant side effect, she still has episodes of sudden onset transient right parietal area sharp pain, daily, lasting 30 seconds then still has trail of discomfort afterwards.  With had reviewed MRI of brain together, mild small vessel disease, no acute lesions July 2015,   UPDATE November 22 2014: She has episodes of light headed in past 1 week, all happened after lunch, everything looks white, dizziness, holocranial headache, goes away in about 15 mintues, she also complains of woke up sever times at night, with night sweat, no clear she has lost 16 lb in 2015 unintentionally  She reported recent Lab by her primary care physician Dr. HYong Channel mild low thryoid functional test, blood pressure medication was changed  She stop gabaeptntin, she no longer has lateralized  headache  REVIEW OF SYSTEMS: Full 14 system review of systems performed and notable only for appetite change, fatigue, light sensitivity, cough, dizziness, headaches, weakness, neck pain, depression, anxiety,  ALLERGIES: Allergies  Allergen Reactions  . Compazine     extrapyramidal syndrome  . Prednisone     Mood swings - crying     HOME MEDICATIONS: Current Outpatient Prescriptions on File Prior to Visit  Medication Sig Dispense Refill  . Calcium Carbonate-Vitamin D (CALTRATE 600+D) 600-400 MG-UNIT per tablet Take 2 tablets by mouth daily.        . CRESTOR 10 MG tablet TAKE 1 TABLET ONCE DAILY.  30 tablet  5  . levothyroxine (SYNTHROID, LEVOTHROID) 112 MCG tablet TAKE 1 TABLET ONCE DAILY.  90 tablet  0  . lisinopril-hydrochlorothiazide (PRINZIDE,ZESTORETIC) 20-25 MG per tablet Take 1 tablet by mouth daily.  90 tablet  3  . sertraline (ZOLOFT) 100 MG tablet Take 50 mg by mouth daily.       . temazepam (RESTORIL) 15 MG capsule Take 1 capsule (15 mg total) by mouth at bedtime as needed for sleep.  30 capsule  3   PAST MEDICAL HISTORY: Past Medical History  Diagnosis Date  . DEPRESSION 04/16/2007  . HYPOTHYROIDISM 04/16/2007  . Osteopenia 06/2013    T score -2.0 FRAX 8.8%/0.8% stable from prior DEXA 2012  . Hypothyroid   . Cancer     Melanoma  . HA (headache)   . Fibroadenoma of breast     PAST SURGICAL HISTORY: Past Surgical History  Procedure Laterality Date  . Excision of melanoma  1978  . Hysteroscopy  1988  HYSTEROSCOPY,D&C  . Breast surgery      bx left breast- and removal of fibroadenoma  . Mass excision Right 02/23/2014    Procedure: BIOPSY TEMPORAL ARTERY RIGHT;  Surgeon: Rozetta Nunnery, MD;  Location: [facility];  Service: ENT;  Laterality: Right;    FAMILY HISTORY: Family History  Problem Relation Age of Onset  . Diabetes Mother   . Heart disease Mother 28  . Dementia Mother   . Diabetes Father   . Heart disease Father 53  . Cancer  Father     Skin cancer-led to death  . Breast cancer Maternal Grandmother     Age 54  . Stomach cancer Neg Hx   . Colon cancer Neg Hx     SOCIAL HISTORY:  History   Social History  . Marital Status: Married    Spouse Name: N/A    Number of Children: 2  . Years of Education: N/A   Occupational History  . Not on file.   Social History Main Topics  . Smoking status: Never Smoker   . Smokeless tobacco: Never Used  . Alcohol Use: 7.0 oz/week    14 drink(s) per week     Comment: wine daily  . Drug Use: No  . Sexual Activity: Yes    Birth Control/ Protection: Post-menopausal   Other Topics Concern  . Not on file   Social History Narrative  . No narrative on file    PHYSICAL EXAM   Filed Vitals:   11/22/14 0911  BP: 108/66  Pulse: 68  Height: _0  (1.6 m)  Weight: 135 lb (61.236 kg)    Not recorded      Body mass index is 23.92 kg/(m^2).   Generalized: In no acute distress PHYSICAL EXAMNIATION:  Gen: NAD, conversant, well nourised, obese, well groomed                     Cardiovascular: Regular rate rhythm, no peripheral edema, warm, nontender. Eyes: Conjunctivae clear without exudates or hemorrhage Neck: Supple, no carotid bruise. Pulmonary: Clear to auscultation bilaterally   NEUROLOGICAL EXAM:  MENTAL STATUS: Speech:    Speech is normal; fluent and spontaneous with normal comprehension.  Cognition:    The patient is oriented to person, place, and time;     recent and remote memory intact;     language fluent;     normal attention, concentration,     fund of knowledge.  CRANIAL NERVES: CN II: Visual fields are full to confrontation. Fundoscopic exam is normal with sharp discs and no vascular changes. Venous pulsations are present bilaterally. Pupils are 4 mm and briskly reactive to light. Visual acuity is 20/20 bilaterally. CN III, IV, VI: extraocular movement are normal. No ptosis. CN V: Facial sensation is intact to pinprick in all 3  divisions bilaterally. Corneal responses are intact.  CN VII: Face is symmetric with normal eye closure and smile. CN VIII: Hearing is normal to rubbing fingers CN IX, X: Palate elevates symmetrically. Phonation is normal. CN XI: Head turning and shoulder shrug are intact CN XII: Tongue is midline with normal movements and no atrophy.  MOTOR: There is no pronator drift of out-stretched arms. Muscle bulk and tone are normal. Muscle strength is normal.   Shoulder abduction Shoulder external rotation Elbow flexion Elbow extension Wrist flexion Wrist extension Finger abduction Hip flexion Knee flexion Knee extension Ankle dorsi flexion Ankle plantar flexion  R _1 5  _0 L _1 REFLEXES: Reflexes are 2+ and symmetric at the biceps, triceps, knees, and ankles. Plantar responses are flexor.  SENSORY: Light touch, pinprick, position sense, and vibration sense are intact in fingers and toes.  COORDINATION: Rapid alternating movements and fine finger movements are intact. There is no dysmetria on finger-to-nose and heel-knee-shin. There are no abnormal or extraneous movements.   GAIT/STANCE: Posture is normal. Gait is steady with normal steps, base, arm swing, and turning. Heel and toe walking are normal. Tandem gait is normal.  Romberg is absent.     DIAGNOSTIC DATA (LABS, IMAGING, TESTING) - I reviewed patient records, labs, notes, testing and imaging myself where available.  Lab Results  Component Value Date   WBC 3.7* 11/17/2014   HGB 12.6 11/17/2014   HCT 36.8 11/17/2014   MCV 90.1 11/17/2014   PLT 202.0 11/17/2014      Component Value Date/Time   NA 139 11/17/2014 1143   K 4.9 11/17/2014 1143   CL 104 11/17/2014 1143   CO2 31 11/17/2014 1143   GLUCOSE 83 11/17/2014 1143   BUN 23 11/17/2014 1143   CREATININE 0.91 11/17/2014 1143   CALCIUM 10.7* 11/17/2014 1143   PROT 7.1 11/17/2014 1143   ALBUMIN 4.8 11/17/2014 1143   AST 18  11/17/2014 1143   ALT 17 11/17/2014 1143   ALKPHOS 59 11/17/2014 1143   BILITOT 0.5 11/17/2014 1143   GFRNONAA 66.95 11/07/2009 1417   GFRAA 93 09/09/2008 0932   Lab Results  Component Value Date   CHOL 207* 03/03/2013   HDL 108.20 03/03/2013   LDLCALC 87 12/11/2011   LDLDIRECT 85.2 03/03/2013   TRIG 56.0 03/03/2013   CHOLHDL 2 03/03/2013   No results found for: HGBA1C No results found for: VITAMINB12 Lab Results  Component Value Date   TSH 0.10* 11/17/2014    ASSESSMENT AND PLAN  VALITA RIGHTER is a 69 y.o. female complains of  onset of headaches since June18 2015 essentially normal neurological examinations, right temporal artery biopsy shoulder no evidence of temporal arteritis, MRI of the brain only showed small vessel disease in July 2015, symptoms has much improved was gabapentin 100 mg 3 times a day, now she complains of 3 episodes of lightheadedness, dizziness, blurry vision, after lunch, likely related to her blood pressure change, blood pressure medication is changed by her primary care physician in past few days,  1, keep well hydration, document blood pressure, 2, return to clinic in 3 months with Hoyle Sauer if she continued to do well, may discharge her from our clinic. If she continue complains of frequent headaches, may consider add on low dose nortriptyline,,     Marcial Pacas, M.D. Ph.D.  [facility] [address] [place], [place] [address] [phone]

## 2014-11-26 NOTE — Progress Notes (Signed)
I have reviewed and agreed above plan. 

## 2014-11-30 ENCOUNTER — Other Ambulatory Visit: Payer: Medicare Other

## 2014-11-30 ENCOUNTER — Other Ambulatory Visit: Payer: Self-pay | Admitting: Family Medicine

## 2014-11-30 DIAGNOSIS — I10 Essential (primary) hypertension: Secondary | ICD-10-CM

## 2014-11-30 DIAGNOSIS — R42 Dizziness and giddiness: Secondary | ICD-10-CM

## 2014-12-06 ENCOUNTER — Telehealth: Payer: Self-pay

## 2014-12-06 ENCOUNTER — Other Ambulatory Visit: Payer: Medicare Other

## 2014-12-06 DIAGNOSIS — R42 Dizziness and giddiness: Secondary | ICD-10-CM

## 2014-12-06 LAB — GLUCOSE TOLERANCE, 2 HOURS
Glucose, 1 Hour GTT: 245 mg/dL
Glucose, 2 hour: 168 mg/dL
Glucose, Fasting: 88 mg/dL (ref 70–99)

## 2014-12-06 NOTE — Telephone Encounter (Signed)
Mrs. Malachy Mood can you please schedule pt a f/u visit per Dr. Yong Channel.

## 2014-12-20 ENCOUNTER — Encounter: Payer: Self-pay | Admitting: Family Medicine

## 2014-12-20 ENCOUNTER — Ambulatory Visit (INDEPENDENT_AMBULATORY_CARE_PROVIDER_SITE_OTHER): Payer: Medicare Other | Admitting: Family Medicine

## 2014-12-20 VITALS — BP 110/72 | HR 62 | Temp 98.1°F | Wt 131.0 lb

## 2014-12-20 DIAGNOSIS — E032 Hypothyroidism due to medicaments and other exogenous substances: Secondary | ICD-10-CM | POA: Diagnosis not present

## 2014-12-20 DIAGNOSIS — D72819 Decreased white blood cell count, unspecified: Secondary | ICD-10-CM | POA: Diagnosis not present

## 2014-12-20 DIAGNOSIS — I10 Essential (primary) hypertension: Secondary | ICD-10-CM | POA: Diagnosis not present

## 2014-12-20 DIAGNOSIS — R634 Abnormal weight loss: Secondary | ICD-10-CM | POA: Diagnosis not present

## 2014-12-20 LAB — CBC WITH DIFFERENTIAL/PLATELET
BASOS ABS: 0 10*3/uL (ref 0.0–0.1)
BASOS PCT: 0.3 % (ref 0.0–3.0)
EOS ABS: 0.2 10*3/uL (ref 0.0–0.7)
EOS PCT: 4.2 % (ref 0.0–5.0)
HCT: 37.8 % (ref 36.0–46.0)
HEMOGLOBIN: 12.6 g/dL (ref 12.0–15.0)
LYMPHS PCT: 19.5 % (ref 12.0–46.0)
Lymphs Abs: 0.9 10*3/uL (ref 0.7–4.0)
MCHC: 33.3 g/dL (ref 30.0–36.0)
MCV: 91.5 fl (ref 78.0–100.0)
MONO ABS: 0.4 10*3/uL (ref 0.1–1.0)
Monocytes Relative: 7.7 % (ref 3.0–12.0)
NEUTROS ABS: 3.2 10*3/uL (ref 1.4–7.7)
NEUTROS PCT: 68.3 % (ref 43.0–77.0)
Platelets: 196 10*3/uL (ref 150.0–400.0)
RBC: 4.14 Mil/uL (ref 3.87–5.11)
RDW: 14.2 % (ref 11.5–15.5)
WBC: 4.7 10*3/uL (ref 4.0–10.5)

## 2014-12-20 LAB — BASIC METABOLIC PANEL
BUN: 17 mg/dL (ref 6–23)
CO2: 25 meq/L (ref 19–32)
CREATININE: 0.87 mg/dL (ref 0.40–1.20)
Calcium: 10.7 mg/dL — ABNORMAL HIGH (ref 8.4–10.5)
Chloride: 104 mEq/L (ref 96–112)
GFR: 68.54 mL/min (ref >60.00)
Glucose, Bld: 84 mg/dL (ref 70–99)
Potassium: 4 mEq/L (ref 3.5–5.1)
SODIUM: 138 meq/L (ref 135–145)

## 2014-12-20 LAB — TSH: TSH: 0.1 u[IU]/mL — AB (ref 0.35–4.50)

## 2014-12-20 NOTE — Progress Notes (Signed)
Garret Reddish, MD Phone: [phone]  Subjective:   Caroline Peters is a 69 y.o. year old very pleasant female patient who presents with the following:  Hypertension-controlled, calcium was high so hctz stopped, on lisinopril alone At last visit she was having dizzy episodes. She states since HCTZ was stopped these episodes have resolved as well.  BP Readings from Last 3 Encounters:  12/20/14 110/72  11/22/14 108/66  11/17/14 110/72   Home BP monitoring-no Compliant with medications-yes without side effects ROS-Denies any CP, HA, SOB, blurry vision, LE edema, transient weakness, orthopnea, PND.   Weight loss Leukopenia-mild and intermittent in past Hypothyroidism-mild decreased TSH last visit -continues to slowly lose weight. Patient states she is not eating as much as she has in the past due to decreased appetite but feels well otherwise. She is actually pleased by the weight loss. Last ESR nto elevated.  ROS- no night sweats at this point-resolved after last visit.   Past Medical History- Patient Active Problem List   Diagnosis Date Noted  . Insomnia 06/23/2014    Priority: Medium  . HA (headache)     Priority: Medium  . Essential hypertension, benign 03/03/2014    Priority: Medium  . Hyperlipidemia 12/11/2011    Priority: Medium  . Hypothyroid     Priority: Medium  . Depression 04/16/2007    Priority: Medium  . Melanoma     Priority: Low  . Osteopenia     Priority: Low   Medications- reviewed and updated Current Outpatient Prescriptions  Medication Sig Dispense Refill  . Calcium Carbonate-Vitamin D (CALTRATE 600+D) 600-400 MG-UNIT per tablet Take 2 tablets by mouth daily.      . CRESTOR 10 MG tablet TAKE 1 TABLET ONCE DAILY. 30 tablet 3  . levothyroxine (SYNTHROID, LEVOTHROID) 112 MCG tablet TAKE 1 TABLET ONCE DAILY. 30 tablet 3  . lisinopril (PRINIVIL,ZESTRIL) 40 MG tablet Take 1 tablet (40 mg total) by mouth daily. 90 tablet 1  . sertraline (ZOLOFT) 100 MG  tablet Take 50 mg by mouth daily.     Marland Kitchen zolpidem (AMBIEN) 5 MG tablet Half to one tablet at bedtime as needed for sleep (Patient not taking: Reported on 12/20/2014) 30 tablet 5    Objective: BP 110/72 mmHg  Pulse 62  Temp(Src) 98.1 F (36.7 C)  Wt 131 lb (59.421 kg) Gen: NAD, resting comfortably No thyromegaly or nodules CV: RRR no murmurs rubs or gallops Lungs: CTAB no crackles, wheeze, rhonchi Abdomen: soft/nontender/nondistended/normal bowel sounds.  Ext: no edema Skin: warm, dry Neuro: grossly normal, moves all extremities   Assessment/Plan:  Essential hypertension, benign Controlled on lisinopril alone. Dizzy episodes resolved off HCTZ-orthostatic? We discussed EKG and cardiac monitor if episodes were to recur. See 3/16 note  Regarding hypercalcemia-check bmet today to see if resolved off hctz. Consider vit D alone and no calcium supplement if issue persists.    Hypothyroid Check TSH today and if persists in low range, decrease levothyroxine as could eb cause of weight loss.    Weight loss- could be thyroid related, could be just decreased PO intake as patient admits to today. Could still consider Ct abd/pelvis but think low yield. Check HIV for mild leukopenia and recheck CBC as well.   3 month follow up to trend weight.   Orders Placed This Encounter  Procedures  . Basic metabolic panel    [place]  . HIV antibody    [facility]  . CBC with Differential/Platelet  . TSH    [place]

## 2014-12-20 NOTE — Assessment & Plan Note (Addendum)
Controlled on lisinopril alone. Dizzy episodes resolved off HCTZ-orthostatic? We discussed EKG and cardiac monitor if episodes were to recur. See 3/16 note  Regarding hypercalcemia-check bmet today to see if resolved off hctz. Consider vit D alone and no calcium supplement if issue persists.

## 2014-12-20 NOTE — Patient Instructions (Signed)
Update labs Check on calcium levels off of HCTZ Blood counts a hair low-check counts again as well as HIV (strongly doubt any concern here) Thyroid with weight loss-if still low will adjust medication down  Let's check 3-4 months

## 2014-12-20 NOTE — Assessment & Plan Note (Signed)
Check TSH today and if persists in low range, decrease levothyroxine as could eb cause of weight loss.

## 2014-12-21 LAB — HIV ANTIBODY (ROUTINE TESTING W REFLEX): HIV 1&2 Ab, 4th Generation: NONREACTIVE

## 2014-12-21 MED ORDER — LEVOTHYROXINE SODIUM 100 MCG PO TABS: 100.0000 ug | ORAL_TABLET | Freq: Every day | ORAL | Status: DC

## 2014-12-21 NOTE — Addendum Note (Signed)
Addended by: Marin Olp on: 12/21/2014 07:55 AM   Modules accepted: Orders, Medications

## 2014-12-21 NOTE — Addendum Note (Signed)
Addended by: Clyde Lundborg A on: 12/21/2014 01:44 PM   Modules accepted: Orders

## 2014-12-23 ENCOUNTER — Ambulatory Visit: Payer: Medicare Other | Admitting: Family Medicine

## 2015-01-02 ENCOUNTER — Other Ambulatory Visit: Payer: Self-pay | Admitting: Family Medicine

## 2015-02-15 ENCOUNTER — Other Ambulatory Visit (INDEPENDENT_AMBULATORY_CARE_PROVIDER_SITE_OTHER): Payer: Medicare Other

## 2015-02-15 DIAGNOSIS — E032 Hypothyroidism due to medicaments and other exogenous substances: Secondary | ICD-10-CM

## 2015-02-15 LAB — BASIC METABOLIC PANEL
BUN: 16 mg/dL (ref 6–23)
CO2: 26 mEq/L (ref 19–32)
Calcium: 10.3 mg/dL (ref 8.4–10.5)
Chloride: 104 mEq/L (ref 96–112)
Creatinine, Ser: 0.77 mg/dL (ref 0.40–1.20)
GFR: 78.88 mL/min (ref >60.00)
GLUCOSE: 81 mg/dL (ref 70–99)
Potassium: 4.1 mEq/L (ref 3.5–5.1)
Sodium: 139 mEq/L (ref 135–145)

## 2015-02-15 LAB — TSH: TSH: 0.83 u[IU]/mL (ref 0.35–4.50)

## 2015-03-16 LAB — HM MAMMOGRAPHY: HM MAMMO: NORMAL

## 2015-03-22 ENCOUNTER — Encounter: Payer: Self-pay | Admitting: Family Medicine

## 2015-03-28 ENCOUNTER — Encounter: Payer: Self-pay | Admitting: Family Medicine

## 2015-05-12 ENCOUNTER — Telehealth: Payer: Self-pay | Admitting: Family Medicine

## 2015-05-18 NOTE — Telephone Encounter (Signed)
Will inform pt when she comes in with husband tomorrow.

## 2015-05-18 NOTE — Telephone Encounter (Signed)
Pt is wanting labs done due to hair loss.

## 2015-05-18 NOTE — Telephone Encounter (Signed)
Reviewing chart in April said 3-4 month follow up. I may want to order some other labs along with iron she had asked me about so let's just get her to schedule with me.

## 2015-05-20 ENCOUNTER — Encounter: Payer: Self-pay | Admitting: Family Medicine

## 2015-05-20 ENCOUNTER — Ambulatory Visit (INDEPENDENT_AMBULATORY_CARE_PROVIDER_SITE_OTHER): Payer: Medicare Other | Admitting: Family Medicine

## 2015-05-20 VITALS — BP 122/64 | HR 72 | Temp 98.4°F | Wt 131.0 lb

## 2015-05-20 DIAGNOSIS — I10 Essential (primary) hypertension: Secondary | ICD-10-CM

## 2015-05-20 DIAGNOSIS — L659 Nonscarring hair loss, unspecified: Secondary | ICD-10-CM | POA: Diagnosis not present

## 2015-05-20 DIAGNOSIS — Z86010 Personal history of colonic polyps: Secondary | ICD-10-CM | POA: Insufficient documentation

## 2015-05-20 DIAGNOSIS — E032 Hypothyroidism due to medicaments and other exogenous substances: Secondary | ICD-10-CM

## 2015-05-20 DIAGNOSIS — Z860101 Personal history of adenomatous and serrated colon polyps: Secondary | ICD-10-CM | POA: Insufficient documentation

## 2015-05-20 LAB — FERRITIN: Ferritin: 123.7 ng/mL (ref 10.0–291.0)

## 2015-05-20 LAB — IRON AND TIBC
%SAT: 34 % (ref 11–50)
IRON: 92 ug/dL (ref 45–160)
TIBC: 272 ug/dL (ref 250–450)
UIBC: 180 ug/dL (ref 125–400)

## 2015-05-20 NOTE — Assessment & Plan Note (Signed)
S:  Controlled. No longer losing weight- stable since last visit. This was primary concern and reason for follow up.  Lab Results  Component Value Date   TSH 0.83 02/15/2015  ROS-No hair or nail changes. No heat/cold intolerance. No constipation or diarrhea. Denies shakiness or anxiety.  A/P: continue levothyroxine 182mcg

## 2015-05-20 NOTE — Assessment & Plan Note (Signed)
S:Dr. Tonia Brooms following- requested TSH, CBC, CMET, ferritin. Everything but ferritin previously collected- ferritin per her notes should be >40 (Dr. Tonia Brooms).  A/P: Ferriting >100-not likely cause of hair loss.

## 2015-05-20 NOTE — Patient Instructions (Addendum)
Thrilled weight is stable  Blood pressure looks good, thyroid sounds stable  No clear cause for this hair loss  Check in on iron levels  If you don't hear from [place] by Tuesday, give [place] a call.   We will try to remember to send to Dr. Tonia Brooms

## 2015-05-20 NOTE — Progress Notes (Signed)
Garret Reddish, MD  Subjective:  Caroline Peters is a 69 y.o. year old very pleasant female patient who presents for/with See problem oriented charting ROS- no chest pain, shortness of breath  Past Medical History-  Patient Active Problem List   Diagnosis Date Noted  . Insomnia 06/23/2014    Priority: Medium  . HA (headache)     Priority: Medium  . Essential hypertension, benign 03/03/2014    Priority: Medium  . Hyperlipidemia 12/11/2011    Priority: Medium  . Hypothyroid     Priority: Medium  . Depression 04/16/2007    Priority: Medium  . History of adenomatous polyp of colon 05/20/2015    Priority: Low  . Hair loss 05/20/2015    Priority: Low  . Melanoma     Priority: Low  . Osteopenia     Priority: Low    Medications- reviewed and updated Current Outpatient Prescriptions  Medication Sig Dispense Refill  . CRESTOR 10 MG tablet TAKE 1 TABLET ONCE DAILY. 30 tablet 6  . levothyroxine (SYNTHROID, LEVOTHROID) 100 MCG tablet Take 1 tablet (100 mcg total) by mouth daily. 30 tablet 11  . lisinopril (PRINIVIL,ZESTRIL) 40 MG tablet TAKE 1 TABLET EACH DAY. 90 tablet 2  . sertraline (ZOLOFT) 100 MG tablet Take 50 mg by mouth daily.     Marland Kitchen zolpidem (AMBIEN) 5 MG tablet Half to one tablet at bedtime as needed for sleep (Patient not taking: Reported on 12/20/2014) 30 tablet 5   No current facility-administered medications for this visit.    Objective: BP 122/64 mmHg  Pulse 72  Temp(Src) 98.4 F (36.9 C)  Wt 131 lb (59.421 kg) Gen: NAD, resting comfortably No thyromegaly CV: RRR no murmurs rubs or gallops Lungs: CTAB no crackles, wheeze, rhonchi Abdomen: soft/nontender/nondistended/normal bowel sounds. No rebound or guarding.  Ext: no edema Skin: warm, dry Neuro: grossly normal, moves all extremities  Assessment/Plan:  Essential hypertension, benign S: controlled. On lisinopril 40mg  BP Readings from Last 3 Encounters:  05/20/15 122/64  12/20/14 110/72  11/22/14 108/66   A/P:Continue current meds   Hypothyroid S:  Controlled. No longer losing weight- stable since last visit. This was primary concern and reason for follow up.  Lab Results  Component Value Date   TSH 0.83 02/15/2015  ROS-No hair or nail changes. No heat/cold intolerance. No constipation or diarrhea. Denies shakiness or anxiety.  A/P: continue levothyroxine 125mcg   Hair loss S:Dr. Tonia Brooms following- requested TSH, CBC, CMET, ferritin. Everything but ferritin previously collected- ferritin per her notes should be >40 (Dr. Tonia Brooms).  A/P: Ferriting >100-not likely cause of hair loss.   6-9 months for CPE  Orders Placed This Encounter  Procedures  . Ferritin  . Iron and TIBC

## 2015-05-20 NOTE — Assessment & Plan Note (Signed)
S: controlled. On lisinopril 40mg  BP Readings from Last 3 Encounters:  05/20/15 122/64  12/20/14 110/72  11/22/14 108/66  A/P:Continue current meds

## 2015-06-02 ENCOUNTER — Other Ambulatory Visit: Payer: Self-pay | Admitting: Family Medicine

## 2015-06-27 ENCOUNTER — Telehealth: Payer: Self-pay

## 2015-06-27 NOTE — Telephone Encounter (Signed)
May fill 

## 2015-06-27 NOTE — Telephone Encounter (Signed)
I received a refill request for Zolpidem. Last refilled 10/15/14 for #30 with 5 refills. Last office visit was 05/20/15. Ok to refill this medication?

## 2015-06-28 MED ORDER — ZOLPIDEM TARTRATE 5 MG PO TABS: ORAL_TABLET | ORAL | Status: DC

## 2015-06-28 NOTE — Telephone Encounter (Signed)
Medication refilled

## 2015-07-11 ENCOUNTER — Encounter: Payer: Self-pay | Admitting: Internal Medicine

## 2015-07-14 ENCOUNTER — Ambulatory Visit (INDEPENDENT_AMBULATORY_CARE_PROVIDER_SITE_OTHER): Payer: Medicare Other

## 2015-07-14 DIAGNOSIS — Z23 Encounter for immunization: Secondary | ICD-10-CM | POA: Diagnosis not present

## 2015-08-03 ENCOUNTER — Encounter: Payer: Self-pay | Admitting: Internal Medicine

## 2015-08-03 ENCOUNTER — Other Ambulatory Visit: Payer: Self-pay | Admitting: Family Medicine

## 2015-09-06 LAB — HM MAMMOGRAPHY

## 2015-09-07 ENCOUNTER — Other Ambulatory Visit: Payer: Self-pay | Admitting: Radiology

## 2015-09-11 ENCOUNTER — Other Ambulatory Visit: Payer: Self-pay | Admitting: Family Medicine

## 2015-09-12 NOTE — Telephone Encounter (Signed)
Pt requesting ALPRAZOLAM 0.5MG     Pt last visit 05/20/15 Pt hasn't take that medication since 2013    Please advise

## 2015-09-12 NOTE — Telephone Encounter (Signed)
Yes thanks #30 may fill

## 2015-09-13 ENCOUNTER — Encounter: Payer: Self-pay | Admitting: Family Medicine

## 2015-09-15 ENCOUNTER — Encounter: Payer: Self-pay | Admitting: Family Medicine

## 2015-09-19 ENCOUNTER — Ambulatory Visit (AMBULATORY_SURGERY_CENTER): Payer: Self-pay | Admitting: *Deleted

## 2015-09-19 VITALS — Ht 62.0 in | Wt 129.8 lb

## 2015-09-19 DIAGNOSIS — Z86010 Personal history of colonic polyps: Secondary | ICD-10-CM

## 2015-09-19 NOTE — Progress Notes (Signed)
No egg or soy allergy known to patient  No issues with past sedation with any surgeries  or procedures, no intubation problems  No diet pills-per patient , no blood thinners  No home 02 use per patient   emmi declined

## 2015-09-20 ENCOUNTER — Encounter: Payer: Self-pay | Admitting: Family Medicine

## 2015-09-28 ENCOUNTER — Encounter: Payer: Self-pay | Admitting: Family Medicine

## 2015-10-03 ENCOUNTER — Ambulatory Visit (AMBULATORY_SURGERY_CENTER): Payer: Medicare Other | Admitting: Internal Medicine

## 2015-10-03 ENCOUNTER — Encounter: Payer: Self-pay | Admitting: Internal Medicine

## 2015-10-03 VITALS — BP 121/72 | HR 61 | Temp 98.2°F | Resp 14 | Ht 63.0 in | Wt 131.0 lb

## 2015-10-03 DIAGNOSIS — D120 Benign neoplasm of cecum: Secondary | ICD-10-CM | POA: Diagnosis not present

## 2015-10-03 DIAGNOSIS — D121 Benign neoplasm of appendix: Secondary | ICD-10-CM

## 2015-10-03 DIAGNOSIS — Z86010 Personal history of colonic polyps: Secondary | ICD-10-CM

## 2015-10-03 DIAGNOSIS — D125 Benign neoplasm of sigmoid colon: Secondary | ICD-10-CM

## 2015-10-03 MED ORDER — SODIUM CHLORIDE 0.9 % IV SOLN: 500.0000 mL | INTRAVENOUS | Status: DC

## 2015-10-03 NOTE — Op Note (Signed)
[place]  [address] [employer]. [place], [address]   COLONOSCOPY PROCEDURE REPORT  PATIENT: Caroline Peters, Caroline Peters  MR#: [id number] BIRTHDATE: May 14, 1946 , 69  yrs. old GENDER: female ENDOSCOPIST: Jerene Bears, MD REFERRED FG:[id number] R Patterson, M.D. PROCEDURE DATE:  10/03/2015 PROCEDURE:   Colonoscopy, surveillance , Colonoscopy with snare polypectomy, and Colonoscopy with biopsy First Screening Colonoscopy - Avg.  risk and is 50 yrs.  old or older - No.  Prior Negative Screening - Now for repeat screening. N/A  History of Adenoma - Now for follow-up colonoscopy & has been > or = to 3 yrs.  Yes hx of adenoma.  Has been 3 or more years since last colonoscopy.  Polyps removed today? Yes ASA CLASS:   Class II INDICATIONS:Surveillance due to prior colonic neoplasia and PH Colon Adenoma (tubulovillous adenoma 2013) MEDICATIONS: Monitored anesthesia care and Propofol 250 mg IV  DESCRIPTION OF PROCEDURE:   After the risks benefits and alternatives of the procedure were thoroughly explained, informed consent was obtained.  The digital rectal exam revealed no rectal mass.   The LB [id number]  endoscope was introduced through the anus and advanced to the cecum, which was identified by both the appendix and ileocecal valve. No adverse events experienced. The quality of the prep was good.  (Suprep was used)  The instrument was then slowly withdrawn as the colon was fully examined. Estimated blood loss is zero unless otherwise noted in this procedure report.   COLON FINDINGS: Possible polyp surrounding and appearing to come from within the appendiceal orifice.  Cold forceps used for biopsy (multiple).   A sessile polyp measuring 5 mm in size was found at the cecum.  A polypectomy was performed with a cold snare.  The resection was complete, the polyp tissue was completely retrieved and sent to histology.   A sessile polyp measuring 5 mm in size was found in the sigmoid colon.   A polypectomy was performed with a cold snare.  The resection was complete, the polyp tissue was completely retrieved and sent to histology.   There was mild diverticulosis noted in the left colon.  Retroflexed views revealed small internal hemorrhoids. The time to cecum = 2.8 Withdrawal time = 11.2   The scope was withdrawn and the procedure completed.  COMPLICATIONS: There were no immediate complications.    ENDOSCOPIC IMPRESSION: 1.   Possible polyp surrounding and appearing to come from within the appendiceal orifice.  Cold forceps used for biopsy (multiple) 2.   Sessile polyp was found at the cecum; polypectomy was performed with a cold snare 3.   Sessile polyp was found in the sigmoid colon; polypectomy was performed with a cold snare 4.   Mild diverticulosis was noted in the left colon  RECOMMENDATIONS: 1.  Await pathology results 2.  High fiber diet 3.  Timing of repeat colonoscopy will be determined by pathology findings. 4.  You will receive a letter within 1-2 weeks with the results of your biopsy as well as final recommendations.  Please call my office if you have not received a letter after 3 weeks.  eSigned:  Jerene Bears, MD 10/03/2015 8:27 AM   cc:  the patient, Dr. Yong Channel   PATIENT NAME:  Caroline Peters, Caroline Peters MR#: [id number]

## 2015-10-03 NOTE — Patient Instructions (Signed)
YOU HAD AN ENDOSCOPIC PROCEDURE TODAY AT THE [facility]:   Refer to the procedure report that was given to you for any specific questions about what was found during the examination.  If the procedure report does not answer your questions, please call your gastroenterologist to clarify.  If you requested that your care partner not be given the details of your procedure findings, then the procedure report has been included in a sealed envelope for you to review at your convenience later.  YOU SHOULD EXPECT: Some feelings of bloating in the abdomen. Passage of more gas than usual.  Walking can help get rid of the air that was put into your GI tract during the procedure and reduce the bloating. If you had a lower endoscopy (such as a colonoscopy or flexible sigmoidoscopy) you may notice spotting of blood in your stool or on the toilet paper. If you underwent a bowel prep for your procedure, you may not have a normal bowel movement for a few days.  Please Note:  You might notice some irritation and congestion in your nose or some drainage.  This is from the oxygen used during your procedure.  There is no need for concern and it should clear up in a day or so.  SYMPTOMS TO REPORT IMMEDIATELY:   Following lower endoscopy (colonoscopy or flexible sigmoidoscopy):  Excessive amounts of blood in the stool  Significant tenderness or worsening of abdominal pains  Swelling of the abdomen that is new, acute  Fever of 100F or higher   For urgent or emergent issues, a gastroenterologist can be reached at any hour by calling [phone].   DIET: Your first meal following the procedure should be a small meal and then it is ok to progress to your normal diet. Heavy or fried foods are harder to digest and may make you feel nauseous or bloated.  Likewise, meals heavy in dairy and vegetables can increase bloating.  Drink plenty of fluids but you should avoid alcoholic beverages for 24  hours.  ACTIVITY:  You should plan to take it easy for the rest of today and you should NOT DRIVE or use heavy machinery until tomorrow (because of the sedation medicines used during the test).    FOLLOW UP: Our staff will call the number listed on your records the next business day following your procedure to check on you and address any questions or concerns that you may have regarding the information given to you following your procedure. If we do not reach you, we will leave a message.  However, if you are feeling well and you are not experiencing any problems, there is no need to return our call.  We will assume that you have returned to your regular daily activities without incident.  If any biopsies were taken you will be contacted by phone or by letter within the next 1-3 weeks.  Please call us at [phone] if you have not heard about the biopsies in 3 weeks.    SIGNATURES/CONFIDENTIALITY: You and/or your care partner have signed paperwork which will be entered into your electronic medical record.  These signatures attest to the fact that that the information above on your After Visit Summary has been reviewed and is understood.  Full responsibility of the confidentiality of this discharge information lies with you and/or your care-partner.  Polyps, diverticulosis, high fiber diet-handouts given  Repeat colonoscopy will be determined by pathology.  

## 2015-10-03 NOTE — Progress Notes (Signed)
A/ox3 pleased with MAC, report to April RN 

## 2015-10-03 NOTE — Progress Notes (Signed)
Called to room to assist during endoscopic procedure.  Patient ID and intended procedure confirmed with present staff. Received instructions for my participation in the procedure from the performing physician.  

## 2015-10-04 ENCOUNTER — Telehealth: Payer: Self-pay | Admitting: Emergency Medicine

## 2015-10-04 NOTE — Telephone Encounter (Signed)
  Follow up Call-  Call back number 10/03/2015  Post procedure Call Back phone  # [phone]  Permission to leave phone message Yes     Patient questions:  Do you have a fever, pain , or abdominal swelling? No. Pain Score  0 *  Have you tolerated food without any problems? Yes.    Have you been able to return to your normal activities? Yes.    Do you have any questions about your discharge instructions: Diet   No. Medications  No. Follow up visit  No.  Do you have questions or concerns about your Care? No.  Actions: * If pain score is 4 or above: No action needed, pain <4.

## 2015-10-19 ENCOUNTER — Ambulatory Visit: Payer: Self-pay | Admitting: General Surgery

## 2015-10-19 DIAGNOSIS — D121 Benign neoplasm of appendix: Secondary | ICD-10-CM | POA: Diagnosis not present

## 2015-10-19 NOTE — H&P (Signed)
Caroline Peters 10/19/2015 11:40 AM Location: [facility] Patient #: [id number] DOB: 08-Mar-1946 Married / Language: English / Race: White Female  History of Present Illness Caroline Hollingshead MD; 10/19/2015 12:16 PM) Patient words: appendeceal polyp.  The patient is a 70 year old female.   Note:She is referred by Dr. 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 She underwent a follow-up colonoscopy October 03, 2015. She had a previous tubulovillous adenoma removed in 2013. At that time, a polyp was noted in the appendiceal orifice extending into the appendix. Biopsies were consistent with a serrated sessile polyp without dysplasia. This was not able to be removed by way of colonoscopy so she's been referred here for appendectomy. 2 other polyps were removed elsewhere that were benign. No immediate family history of colon cancer. She has a first cousin who had colon cancer. She is an otherwise fairly good health. Her husband is with her today.  Other Problems Jeralyn Ruths, [place]; 10/19/2015 11:40 AM) Depression High blood pressure Hypercholesterolemia Kidney Stone Melanoma Thyroid Disease  Past Surgical History Jeralyn Ruths, [place]; 10/19/2015 11:40 AM) Breast Biopsy Bilateral. Colon Polyp Removal - Colonoscopy Oral Surgery Tonsillectomy  Diagnostic Studies History Jeralyn Ruths, CMA; 10/19/2015 11:40 AM) Colonoscopy within last year Mammogram within last year Pap Smear 1-5 years ago  Allergies Jeralyn Ruths, CMA; 10/19/2015 11:43 AM) Compazine *ANTIPSYCHOTICS/ANTIMANIC AGENTS* PredniSONE (Pak) *CORTICOSTEROIDS*  Medication History Jeralyn Ruths, CMA; 10/19/2015 11:42 AM) Xanax (0.5MG  Tablet, Oral prn) Active. Biotin (1000MCG Tablet, Oral daily) Active. Calcium Carb-Cholecalciferol (600-200MG -UNIT Tablet, Oral daily) Active. Lisinopril (40MG  Tablet, Oral daily)  Active. Crestor (10MG  Tablet, Oral daily) Active. Zoloft (100MG  Tablet, Oral daily) Active. Ambien (5MG  Tablet, Oral prn) Active.  Social History Jeralyn Ruths, CMA; 10/19/2015 11:40 AM) Alcohol use Moderate alcohol use. Caffeine use Carbonated beverages, Coffee, Tea. No drug use Tobacco use Never smoker.  Family History Jeralyn Ruths, [place]; 10/19/2015 11:40 AM) Cancer Father. Diabetes Mellitus Father, Mother. Heart Disease Father, Mother.  Pregnancy / Birth History Jeralyn Ruths, [place]; 10/19/2015 11:40 AM) Age at menarche 72 years. Age of menopause 56-55 Gravida 2 Maternal age 46-30 Para 2     Review of Systems ([place]; 10/19/2015 11:40 AM) HEENT Present- Wears glasses/contact lenses. Not Present- Earache, Hearing Loss, Hoarseness, Nose Bleed, Oral Ulcers, Ringing in the Ears, Seasonal Allergies, Sinus Pain, Sore Throat, Visual Disturbances and Yellow Eyes. Respiratory Not Present- Bloody sputum, Chronic Cough, Difficulty Breathing, Snoring and Wheezing. Breast Not Present- Breast Mass, Breast Pain, Nipple Discharge and Skin Changes. Cardiovascular Not Present- Chest Pain, Difficulty Breathing Lying Down, Leg Cramps, Palpitations, Rapid Heart Rate, Shortness of Breath and Swelling of Extremities. Psychiatric Present- Depression. Not Present- Anxiety, Bipolar, Change in Sleep Pattern, Fearful and Frequent crying. Endocrine Present- Hair Changes. Not Present- Cold Intolerance, Excessive Hunger, Heat Intolerance, Hot flashes and New Diabetes. Hematology Not Present- Easy Bruising, Excessive bleeding, Gland problems, HIV and Persistent Infections.  Vitals Jearld Fenton Morris CMA; 10/19/2015 11:44 AM) 10/19/2015 11:43 AM Weight: 129 lb Height: 62in Body Surface Area: 1.59 m Body Mass Index: 23.59 kg/m  Temp.: 98.45F(Oral)  Pulse: 64 (Regular)  BP: 162/80 (Sitting, Left Arm, Standard)      Physical Exam Caroline Hollingshead MD; 10/19/2015 12:17  PM)  The physical exam findings are as follows: Note:General: WDWN in NAD. Pleasant and cooperative.  HEENT: [place]/AT, no facial masses  EYES: EOMI, no icterus  CV: RRR, no murmur, no JVD.  CHEST: Breath sounds equal and  clear. Respirations nonlabored.  ABDOMEN: Soft, nontender, nondistended, no masses, no organomegaly, active bowel sounds, no scars, no hernias.  MUSCULOSKELETAL: FROM, good muscle tone, no edema, left forearm scar at the site of melanoma excision many years ago.  SKIN: No jaundice.  NEUROLOGIC: Alert and oriented, answers questions appropriately, normal gait and station.  PSYCHIATRIC: Normal mood, affect , and behavior.    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  Plan I recommend a laparoscopic appendectomy with partial cecectomy possible open ileocecectomy if unable to do the other. We also talked about the fact that if we did the laparoscopic appendectomy and partial cecectomy, if there was polyp at the staple line I would have Dr. Hilarie Fredrickson repeat colonoscopy in 6 months. This is long as the final pathology is benign. I have explained the procedure and risks of appendectomy and possible colon resection. Risks include but are not limited to bleeding, infection, wound problems, anesthesia, anastomotic leak, need for colostomy, need for reoperative surgery, injury to intraabominal organs (such as intestine, spleen, kidney, bladder, ureter, etc.), ileus, irregular bowel habits. She seems to understand and agrees with the plan.  Jackolyn Confer, MD

## 2015-11-01 NOTE — Patient Instructions (Signed)
Caroline Peters  11/01/2015   Your procedure is scheduled on: 11/10/2015    Report to [facility]  Entrance take [place]  elevators to 3rd floor to  [place] at    1015 AM.  Call this number if you have problems the morning of surgery [phone]   Remember: ONLY 1 PERSON MAY GO WITH YOU TO SHORT STAY TO GET  READY MORNING OF [place].  Do not eat food or drink liquids :After Midnight.     Take these medicines the morning of surgery with A SIP OF WATER: Xanax if needed, Synthroid, Zoloft                                 You may not have any metal on your body including hair pins and              piercings  Do not wear jewelry, make-up, lotions, powders or perfumes, deodorant             Do not wear nail polish.  Do not shave  48 hours prior to surgery.           Do not bring valuables to the hospital. [place].  Contacts, dentures or bridgework may not be worn into surgery.  Leave suitcase in the car. After surgery it may be brought to your room.      Special Instructions:coughing and deep breathing exercises, leg exercises               Please read over the following fact sheets you were given: _____________________________________________________________________             [facility] - Preparing for Surgery Before surgery, you can play an important role.  Because skin is not sterile, your skin needs to be as free of germs as possible.  You can reduce the number of germs on your skin by washing with CHG (chlorahexidine gluconate) soap before surgery.  CHG is an antiseptic cleaner which kills germs and bonds with the skin to continue killing germs even after washing. Please DO NOT use if you have an allergy to CHG or antibacterial soaps.  If your skin becomes reddened/irritated stop using the CHG and inform your nurse when you arrive at Short Stay. Do not shave (including legs and underarms)  for at least 48 hours prior to the first CHG shower.  You may shave your face/neck. Please follow these instructions carefully:  1.  Shower with CHG Soap the night before surgery and the  morning of Surgery.  2.  If you choose to wash your hair, wash your hair first as usual with your  normal  shampoo.  3.  After you shampoo, rinse your hair and body thoroughly to remove the  shampoo.                           4.  Use CHG as you would any other liquid soap.  You can apply chg directly  to the skin and wash                       Gently with a scrungie or  clean washcloth.  5.  Apply the CHG Soap to your body ONLY FROM THE NECK DOWN.   Do not use on face/ open                           Wound or open sores. Avoid contact with eyes, ears mouth and genitals (private parts).                       Wash face,  Genitals (private parts) with your normal soap.             6.  Wash thoroughly, paying special attention to the area where your surgery  will be performed.  7.  Thoroughly rinse your body with warm water from the neck down.  8.  DO NOT shower/wash with your normal soap after using and rinsing off  the CHG Soap.                9.  Pat yourself dry with a clean towel.            10.  Wear clean pajamas.            11.  Place clean sheets on your bed the night of your first shower and do not  sleep with pets. Day of Surgery : Do not apply any lotions/deodorants the morning of surgery.  Please wear clean clothes to the hospital/surgery center.  FAILURE TO FOLLOW THESE INSTRUCTIONS MAY RESULT IN THE CANCELLATION OF YOUR SURGERY PATIENT SIGNATURE_________________________________  NURSE SIGNATURE__________________________________  ________________________________________________________________________

## 2015-11-03 ENCOUNTER — Encounter (HOSPITAL_COMMUNITY): Payer: Self-pay

## 2015-11-03 ENCOUNTER — Encounter (HOSPITAL_COMMUNITY)
Admission: RE | Admit: 2015-11-03 | Discharge: 2015-11-03 | Disposition: A | Discharge status: Home / Self Care | Payer: Medicare Other | Source: Ambulatory Visit | Attending: General Surgery | Admitting: General Surgery

## 2015-11-03 DIAGNOSIS — I10 Essential (primary) hypertension: Secondary | ICD-10-CM | POA: Diagnosis not present

## 2015-11-03 DIAGNOSIS — Z01818 Encounter for other preprocedural examination: Secondary | ICD-10-CM | POA: Diagnosis not present

## 2015-11-03 DIAGNOSIS — Z01812 Encounter for preprocedural laboratory examination: Secondary | ICD-10-CM | POA: Insufficient documentation

## 2015-11-03 DIAGNOSIS — D121 Benign neoplasm of appendix: Secondary | ICD-10-CM | POA: Diagnosis not present

## 2015-11-03 HISTORY — DX: Anxiety disorder, unspecified: F41.9

## 2015-11-03 LAB — COMPREHENSIVE METABOLIC PANEL
ALT: 21 U/L (ref 14–54)
AST: 22 U/L (ref 15–41)
Albumin: 5 g/dL (ref 3.5–5.0)
Alkaline Phosphatase: 76 U/L (ref 38–126)
Anion gap: 8 (ref 5–15)
BILIRUBIN TOTAL: 0.6 mg/dL (ref 0.3–1.2)
BUN: 19 mg/dL (ref 6–20)
CALCIUM: 10 mg/dL (ref 8.9–10.3)
CO2: 27 mmol/L (ref 22–32)
CREATININE: 0.82 mg/dL (ref 0.44–1.00)
Chloride: 108 mmol/L (ref 101–111)
GFR calc Af Amer: >60 mL/min (ref >60)
Glucose, Bld: 75 mg/dL (ref 65–99)
Potassium: 4.6 mmol/L (ref 3.5–5.1)
Sodium: 143 mmol/L (ref 135–145)
TOTAL PROTEIN: 7.6 g/dL (ref 6.5–8.1)

## 2015-11-03 LAB — CBC WITH DIFFERENTIAL/PLATELET
BASOS ABS: 0 10*3/uL (ref 0.0–0.1)
Basophils Relative: 1 %
Eosinophils Absolute: 0.2 10*3/uL (ref 0.0–0.7)
Eosinophils Relative: 6 %
HEMATOCRIT: 42.8 % (ref 36.0–46.0)
Hemoglobin: 13.8 g/dL (ref 12.0–15.0)
LYMPHS PCT: 23 %
Lymphs Abs: 1 10*3/uL (ref 0.7–4.0)
MCH: 30.7 pg (ref 26.0–34.0)
MCHC: 32.2 g/dL (ref 30.0–36.0)
MCV: 95.1 fL (ref 78.0–100.0)
Monocytes Absolute: 0.3 10*3/uL (ref 0.1–1.0)
Monocytes Relative: 7 %
NEUTROS ABS: 2.8 10*3/uL (ref 1.7–7.7)
Neutrophils Relative %: 63 %
Platelets: 218 10*3/uL (ref 150–400)
RBC: 4.5 MIL/uL (ref 3.87–5.11)
RDW: 13.4 % (ref 11.5–15.5)
WBC: 4.3 10*3/uL (ref 4.0–10.5)

## 2015-11-03 NOTE — Progress Notes (Signed)
Final EKG in EPIC done 11/03/15.

## 2015-11-03 NOTE — Progress Notes (Signed)
Patient has bowel prep instructions per MD per patiennt.

## 2015-11-03 NOTE — Progress Notes (Signed)
Spoke with Dr Jillyn Hidden ( anesthesia) regarding ? Type and screen to be done? Prior to surgery.  No Type and Screen needed per anesthesia.

## 2015-11-10 ENCOUNTER — Ambulatory Visit (HOSPITAL_COMMUNITY)
Admission: RE | Admit: 2015-11-10 | Discharge: 2015-11-11 | Disposition: A | Discharge status: Home / Self Care | Payer: Medicare Other | Source: Ambulatory Visit | Attending: General Surgery | Admitting: General Surgery

## 2015-11-10 ENCOUNTER — Inpatient Hospital Stay (HOSPITAL_COMMUNITY): Payer: Medicare Other | Admitting: Certified Registered Nurse Anesthetist

## 2015-11-10 ENCOUNTER — Encounter (HOSPITAL_COMMUNITY)
Admission: RE | Disposition: A | Discharge status: Home / Self Care | Payer: Self-pay | Source: Ambulatory Visit | Attending: General Surgery

## 2015-11-10 ENCOUNTER — Encounter (HOSPITAL_COMMUNITY): Payer: Self-pay | Admitting: *Deleted

## 2015-11-10 DIAGNOSIS — D121 Benign neoplasm of appendix: Secondary | ICD-10-CM | POA: Diagnosis present

## 2015-11-10 DIAGNOSIS — Z85820 Personal history of malignant melanoma of skin: Secondary | ICD-10-CM | POA: Diagnosis not present

## 2015-11-10 DIAGNOSIS — I10 Essential (primary) hypertension: Secondary | ICD-10-CM | POA: Insufficient documentation

## 2015-11-10 DIAGNOSIS — N189 Chronic kidney disease, unspecified: Secondary | ICD-10-CM | POA: Diagnosis not present

## 2015-11-10 DIAGNOSIS — E039 Hypothyroidism, unspecified: Secondary | ICD-10-CM | POA: Diagnosis not present

## 2015-11-10 DIAGNOSIS — Z79899 Other long term (current) drug therapy: Secondary | ICD-10-CM | POA: Diagnosis not present

## 2015-11-10 DIAGNOSIS — I129 Hypertensive chronic kidney disease with stage 1 through stage 4 chronic kidney disease, or unspecified chronic kidney disease: Secondary | ICD-10-CM | POA: Diagnosis not present

## 2015-11-10 DIAGNOSIS — Z87442 Personal history of urinary calculi: Secondary | ICD-10-CM | POA: Diagnosis not present

## 2015-11-10 DIAGNOSIS — F329 Major depressive disorder, single episode, unspecified: Secondary | ICD-10-CM | POA: Diagnosis not present

## 2015-11-10 DIAGNOSIS — K388 Other specified diseases of appendix: Secondary | ICD-10-CM | POA: Diagnosis not present

## 2015-11-10 DIAGNOSIS — E7800 Pure hypercholesterolemia, unspecified: Secondary | ICD-10-CM | POA: Insufficient documentation

## 2015-11-10 DIAGNOSIS — K660 Peritoneal adhesions (postprocedural) (postinfection): Secondary | ICD-10-CM | POA: Diagnosis not present

## 2015-11-10 HISTORY — PX: LAPAROSCOPIC APPENDECTOMY: SHX408

## 2015-11-10 HISTORY — PX: LAPAROSCOPIC ILEOCECECTOMY: SHX5898

## 2015-11-10 SURGERY — APPENDECTOMY, LAPAROSCOPIC
Anesthesia: General | Site: Abdomen

## 2015-11-10 MED ORDER — SUCCINYLCHOLINE CHLORIDE 20 MG/ML IJ SOLN
INTRAMUSCULAR | Status: DC | PRN
  Administered 2015-11-10: 100 mg via INTRAVENOUS

## 2015-11-10 MED ORDER — PROPOFOL 10 MG/ML IV BOLUS
INTRAVENOUS | Status: AC
  Filled 2015-11-10: qty 20

## 2015-11-10 MED ORDER — MORPHINE SULFATE (PF) 2 MG/ML IV SOLN
2.0000 mg | INTRAVENOUS | Status: DC | PRN
  Administered 2015-11-10 (×2): 2 mg via INTRAVENOUS
  Filled 2015-11-10 (×2): qty 1

## 2015-11-10 MED ORDER — MIDAZOLAM HCL 2 MG/2ML IJ SOLN
INTRAMUSCULAR | Status: AC
  Filled 2015-11-10: qty 2

## 2015-11-10 MED ORDER — BUPIVACAINE HCL (PF) 0.5 % IJ SOLN
INTRAMUSCULAR | Status: DC | PRN
  Administered 2015-11-10: 8 mL

## 2015-11-10 MED ORDER — PHENYLEPHRINE HCL 10 MG/ML IJ SOLN
INTRAMUSCULAR | Status: DC | PRN
  Administered 2015-11-10 (×5): 80 ug via INTRAVENOUS

## 2015-11-10 MED ORDER — GLYCOPYRROLATE 0.2 MG/ML IJ SOLN
INTRAMUSCULAR | Status: DC | PRN
  Administered 2015-11-10: 0.2 mg via INTRAVENOUS

## 2015-11-10 MED ORDER — SUGAMMADEX SODIUM 200 MG/2ML IV SOLN
INTRAVENOUS | Status: DC | PRN
  Administered 2015-11-10: 200 mg via INTRAVENOUS

## 2015-11-10 MED ORDER — ZOLPIDEM TARTRATE 5 MG PO TABS
2.5000 mg | ORAL_TABLET | Freq: Every evening | ORAL | Status: DC | PRN
  Administered 2015-11-11: 5 mg via ORAL
  Filled 2015-11-10: qty 1

## 2015-11-10 MED ORDER — DEXAMETHASONE SODIUM PHOSPHATE 10 MG/ML IJ SOLN
INTRAMUSCULAR | Status: AC
  Filled 2015-11-10: qty 1

## 2015-11-10 MED ORDER — ONDANSETRON HCL 4 MG/2ML IJ SOLN
4.0000 mg | Freq: Once | INTRAMUSCULAR | Status: AC
  Administered 2015-11-10: 4 mg via INTRAVENOUS

## 2015-11-10 MED ORDER — KCL IN DEXTROSE-NACL 20-5-0.9 MEQ/L-%-% IV SOLN
INTRAVENOUS | Status: DC
  Administered 2015-11-10 – 2015-11-11 (×2): via INTRAVENOUS
  Filled 2015-11-10 (×3): qty 1000

## 2015-11-10 MED ORDER — HYDROMORPHONE HCL 1 MG/ML IJ SOLN
0.2500 mg | INTRAMUSCULAR | Status: DC | PRN
  Administered 2015-11-10: 0.5 mg via INTRAVENOUS

## 2015-11-10 MED ORDER — ONDANSETRON HCL 4 MG/2ML IJ SOLN: 4.0000 mg | INTRAMUSCULAR | Status: DC | PRN

## 2015-11-10 MED ORDER — ONDANSETRON HCL 4 MG/2ML IJ SOLN
INTRAMUSCULAR | Status: AC
  Filled 2015-11-10: qty 2

## 2015-11-10 MED ORDER — LIDOCAINE HCL (CARDIAC) 20 MG/ML IV SOLN
INTRAVENOUS | Status: DC | PRN
  Administered 2015-11-10: 100 mg via INTRAVENOUS

## 2015-11-10 MED ORDER — LACTATED RINGERS IV SOLN
INTRAVENOUS | Status: DC
  Administered 2015-11-10: 1000 mL via INTRAVENOUS

## 2015-11-10 MED ORDER — LACTATED RINGERS IR SOLN
Status: DC | PRN
  Administered 2015-11-10: 1000 mL

## 2015-11-10 MED ORDER — LEVOTHYROXINE SODIUM 100 MCG PO TABS
100.0000 ug | ORAL_TABLET | Freq: Every day | ORAL | Status: DC
  Administered 2015-11-11: 100 ug via ORAL
  Filled 2015-11-10 (×2): qty 1

## 2015-11-10 MED ORDER — ROCURONIUM BROMIDE 100 MG/10ML IV SOLN
INTRAVENOUS | Status: DC | PRN
  Administered 2015-11-10: 30 mg via INTRAVENOUS

## 2015-11-10 MED ORDER — ONDANSETRON HCL 4 MG/2ML IJ SOLN
INTRAMUSCULAR | Status: DC | PRN
  Administered 2015-11-10: 4 mg via INTRAVENOUS

## 2015-11-10 MED ORDER — CEFAZOLIN SODIUM-DEXTROSE 2-3 GM-% IV SOLR
2.0000 g | Freq: Three times a day (TID) | INTRAVENOUS | Status: AC
  Administered 2015-11-10: 2 g via INTRAVENOUS
  Filled 2015-11-10 (×2): qty 50

## 2015-11-10 MED ORDER — ONDANSETRON 4 MG PO TBDP: 4.0000 mg | ORAL_TABLET | Freq: Four times a day (QID) | ORAL | Status: DC | PRN

## 2015-11-10 MED ORDER — LISINOPRIL 40 MG PO TABS
40.0000 mg | ORAL_TABLET | Freq: Every day | ORAL | Status: DC
  Administered 2015-11-11: 40 mg via ORAL
  Filled 2015-11-10 (×2): qty 1

## 2015-11-10 MED ORDER — HYDROCODONE-ACETAMINOPHEN 5-325 MG PO TABS
1.0000 | ORAL_TABLET | ORAL | Status: DC | PRN
  Administered 2015-11-11 (×2): 2 via ORAL
  Filled 2015-11-10 (×2): qty 2

## 2015-11-10 MED ORDER — ALPRAZOLAM 0.25 MG PO TABS: 0.2500 mg | ORAL_TABLET | Freq: Two times a day (BID) | ORAL | Status: DC | PRN

## 2015-11-10 MED ORDER — ROCURONIUM BROMIDE 100 MG/10ML IV SOLN
INTRAVENOUS | Status: AC
  Filled 2015-11-10: qty 1

## 2015-11-10 MED ORDER — PROPOFOL 10 MG/ML IV BOLUS
INTRAVENOUS | Status: DC | PRN
  Administered 2015-11-10: 140 mg via INTRAVENOUS

## 2015-11-10 MED ORDER — 0.9 % SODIUM CHLORIDE (POUR BTL) OPTIME
TOPICAL | Status: DC | PRN
  Administered 2015-11-10: 1000 mL

## 2015-11-10 MED ORDER — CEFOTETAN DISODIUM-DEXTROSE 2-2.08 GM-% IV SOLR
2.0000 g | INTRAVENOUS | Status: AC
  Administered 2015-11-10: 2 g via INTRAVENOUS

## 2015-11-10 MED ORDER — CEFOTETAN DISODIUM-DEXTROSE 2-2.08 GM-% IV SOLR
INTRAVENOUS | Status: AC
  Filled 2015-11-10: qty 50

## 2015-11-10 MED ORDER — FENTANYL CITRATE (PF) 100 MCG/2ML IJ SOLN
INTRAMUSCULAR | Status: DC | PRN
  Administered 2015-11-10 (×5): 50 ug via INTRAVENOUS

## 2015-11-10 MED ORDER — HYDROMORPHONE HCL 2 MG/ML IJ SOLN
INTRAMUSCULAR | Status: AC
  Filled 2015-11-10: qty 1

## 2015-11-10 MED ORDER — MIDAZOLAM HCL 5 MG/5ML IJ SOLN
INTRAMUSCULAR | Status: DC | PRN
  Administered 2015-11-10: 2 mg via INTRAVENOUS

## 2015-11-10 MED ORDER — BUPIVACAINE HCL (PF) 0.5 % IJ SOLN
INTRAMUSCULAR | Status: AC
Start: 2015-11-10 — End: 2015-11-10
  Filled 2015-11-10: qty 30

## 2015-11-10 MED ORDER — FENTANYL CITRATE (PF) 250 MCG/5ML IJ SOLN
INTRAMUSCULAR | Status: AC
  Filled 2015-11-10: qty 5

## 2015-11-10 SURGICAL SUPPLY — 52 items
APL SKNCLS STERI-STRIP NONHPOA (GAUZE/BANDAGES/DRESSINGS) ×2
APPLIER CLIP 5 13 M/L LIGAMAX5 (MISCELLANEOUS)
APPLIER CLIP ROT 10 11.4 M/L (STAPLE)
APR CLP MED LRG 11.4X10 (STAPLE)
APR CLP MED LRG 5 ANG JAW (MISCELLANEOUS)
BAG SPEC RTRVL LRG 6X4 10 (ENDOMECHANICALS) ×4
BENZOIN TINCTURE PRP APPL 2/3 (GAUZE/BANDAGES/DRESSINGS) ×3 IMPLANT
CHLORAPREP W/TINT 26ML (MISCELLANEOUS) ×3 IMPLANT
CLIP APPLIE 5 13 M/L LIGAMAX5 (MISCELLANEOUS) IMPLANT
CLIP APPLIE ROT 10 11.4 M/L (STAPLE) IMPLANT
COVER SURGICAL LIGHT HANDLE (MISCELLANEOUS) ×3 IMPLANT
CUTTER FLEX LINEAR 45M (STAPLE) IMPLANT
DECANTER SPIKE VIAL GLASS SM (MISCELLANEOUS) ×3 IMPLANT
DRAIN CHANNEL 19F RND (DRAIN) IMPLANT
DRAPE LAPAROSCOPIC ABDOMINAL (DRAPES) ×3 IMPLANT
DRSG TEGADERM 2-3/8X2-3/4 SM (GAUZE/BANDAGES/DRESSINGS) ×7 IMPLANT
ELECT REM PT RETURN 9FT ADLT (ELECTROSURGICAL) ×3
ELECTRODE REM PT RTRN 9FT ADLT (ELECTROSURGICAL) ×2 IMPLANT
ENDOLOOP SUT PDS II  0 18 (SUTURE)
ENDOLOOP SUT PDS II 0 18 (SUTURE) IMPLANT
EVACUATOR SILICONE 100CC (DRAIN) IMPLANT
GAUZE SPONGE 2X2 8PLY STRL LF (GAUZE/BANDAGES/DRESSINGS) ×2 IMPLANT
GLOVE ECLIPSE 8.0 STRL XLNG CF (GLOVE) ×3 IMPLANT
GLOVE INDICATOR 8.0 STRL GRN (GLOVE) ×3 IMPLANT
GOWN STRL REUS W/TWL XL LVL3 (GOWN DISPOSABLE) ×6 IMPLANT
KIT BASIN OR (CUSTOM PROCEDURE TRAY) ×3 IMPLANT
POUCH SPECIMEN RETRIEVAL 10MM (ENDOMECHANICALS) ×4 IMPLANT
RELOAD 45 VASCULAR/THIN (ENDOMECHANICALS) IMPLANT
RELOAD STAPLE 45 2.5 WHT GRN (ENDOMECHANICALS) IMPLANT
RELOAD STAPLE 45 3.5 BLU ETS (ENDOMECHANICALS) IMPLANT
RELOAD STAPLE 60 3.6 BLU REG (STAPLE) IMPLANT
RELOAD STAPLE TA45 3.5 REG BLU (ENDOMECHANICALS) IMPLANT
RELOAD STAPLER BLUE 60MM (STAPLE) ×2 IMPLANT
SCISSORS LAP 5X35 DISP (ENDOMECHANICALS) IMPLANT
SET IRRIG TUBING LAPAROSCOPIC (IRRIGATION / IRRIGATOR) ×3 IMPLANT
SHEARS HARMONIC ACE PLUS 36CM (ENDOMECHANICALS) ×3 IMPLANT
SLEEVE XCEL OPT CAN 5 100 (ENDOMECHANICALS) ×3 IMPLANT
SOLUTION ANTI FOG 6CC (MISCELLANEOUS) ×3 IMPLANT
SPONGE GAUZE 2X2 STER 10/PKG (GAUZE/BANDAGES/DRESSINGS) ×1
STAPLE ECHEON FLEX 60 POW ENDO (STAPLE) ×1 IMPLANT
STAPLER RELOAD BLUE 60MM (STAPLE) ×3
STRIP CLOSURE SKIN 1/2X4 (GAUZE/BANDAGES/DRESSINGS) ×3 IMPLANT
SUT ETHILON 3 0 PS 1 (SUTURE) IMPLANT
SUT MNCRL AB 4-0 PS2 18 (SUTURE) ×3 IMPLANT
TOWEL OR 17X26 10 PK STRL BLUE (TOWEL DISPOSABLE) ×3 IMPLANT
TOWEL OR NON WOVEN STRL DISP B (DISPOSABLE) ×3 IMPLANT
TRAY FOLEY W/METER SILVER 14FR (SET/KITS/TRAYS/PACK) ×3 IMPLANT
TRAY FOLEY W/METER SILVER 16FR (SET/KITS/TRAYS/PACK) ×3 IMPLANT
TRAY LAPAROSCOPIC (CUSTOM PROCEDURE TRAY) ×3 IMPLANT
TROCAR BLADELESS OPT 5 100 (ENDOMECHANICALS) ×3 IMPLANT
TROCAR XCEL BLUNT TIP 100MML (ENDOMECHANICALS) ×3 IMPLANT
TUBING INSUF HEATED (TUBING) ×3 IMPLANT

## 2015-11-10 NOTE — Discharge Instructions (Signed)
LAPAROSCOPIC SURGERY: POST OP INSTRUCTIONS ° °1. DIET: Follow a light bland diet the first 24 hours after arrival home, such as soup, liquids, crackers, etc.  Be sure to include lots of fluids daily.  Avoid fast food or heavy meals as your are more likely to get nauseated.  Eat a low fat the next few days after surgery.   °2. Take your usually prescribed home medications unless otherwise directed. °3. PAIN CONTROL: °a. Pain is best controlled by a usual combination of three different methods TOGETHER: °i. Ice/Heat °ii. Over the counter pain medication °iii. Prescription pain medication °b. Most patients will experience some swelling and bruising around the incisions.  Ice packs or heating pads (30-60 minutes up to 6 times a day) will help. Use ice for the first few days to help decrease swelling and bruising, then switch to heat to help relax tight/sore spots and speed recovery.  Some people prefer to use ice alone, heat alone, alternating between ice & heat.  Experiment to what works for you.  Swelling and bruising can take several weeks to resolve.   °c. It is helpful to take an over-the-counter pain medication regularly for the first few weeks.  Choose one of the following that works best for you: °i. Naproxen (Aleve, etc)  Two 220mg tabs twice a day °ii. Ibuprofen (Advil, etc) Three 200mg tabs four times a day (every meal & bedtime) °iii. Acetaminophen (Tylenol, etc) 500-650mg four times a day (every meal & bedtime) °d. A  prescription for pain medication (such as oxycodone, hydrocodone, etc) should be given to you upon discharge.  Take your pain medication as prescribed.  °i. If you are having problems/concerns with the prescription medicine (does not control pain, nausea, vomiting, rash, itching, etc), please call us [phone] to see if we need to switch you to a different pain medicine that will work better for you and/or control your side effect better. °ii. If you need a refill on your pain medication,  please contact your pharmacy.  They will contact our office to request authorization. Prescriptions will not be filled after 5 pm or on week-ends. °4. Avoid getting constipated.  Between the surgery and the pain medications, it is common to experience some constipation.  Increasing fluid intake and taking a fiber supplement (such as Metamucil, Citrucel, FiberCon, MiraLax, etc) 1-2 times a day regularly will usually help prevent this problem from occurring.  A mild laxative (prune juice, Milk of Magnesia, MiraLax, etc) should be taken according to package directions if there are no bowel movements after 48 hours.   °5. Watch out for diarrhea.  If you have many loose bowel movements, simplify your diet to bland foods & liquids for a few days.  Stop any stool softeners and decrease your fiber supplement.  Switching to mild anti-diarrheal medications (Kayopectate, Pepto Bismol) can help.  If this worsens or does not improve, please call us. °6. Wash / shower every day.  You may shower over the dressings as they are waterproof.  Continue to shower over incision(s) after the dressing is off. °7. Remove your waterproof bandages 3 days after surgery.  You may leave the incision open to air.  You may replace a dressing/Band-Aid to cover the incision for comfort if you wish.  °8. ACTIVITIES as tolerated:   °a. You may resume regular (light) daily activities beginning the next day--such as daily self-care, walking, climbing stairs--gradually increasing light activities as tolerated.  No heavy lifting (over 10 pounds), straining, or   intense activities for 2 weeks. °b. DO NOT PUSH THROUGH PAIN.  Let pain be your guide: If it hurts to do something, don't do it.  Pain is your body warning you to avoid that activity for another week until the pain goes down. °c. You may drive when you are no longer taking prescription pain medication, you can comfortably wear a seatbelt, and you can safely maneuver your car and apply  brakes. °d. You may have sexual intercourse when it is comfortable.  °9. FOLLOW UP in our office °a. Please call [facility] at [phone] to set up an appointment to see your surgeon in the office for a follow-up appointment approximately 2-3 weeks after your surgery. °b. Make sure that you call for this appointment the day you arrive home to insure a convenient appointment time. °10. IF YOU HAVE DISABILITY OR FAMILY LEAVE FORMS, BRING THEM TO THE OFFICE FOR PROCESSING.  DO NOT GIVE THEM TO YOUR DOCTOR. ° °11.  Return to work/school:  Desk work/light activities in 5-7 days, full duty/activities in 2 weeks if pain-free. ° ° °WHEN TO CALL US [phone]: °1. Poor pain control °2. Reactions / problems with new medications (rash/itching, nausea, etc)  °3. Fever over 101.5 F (38.5 C) °4. Inability to urinate °5. Nausea and/or vomiting °6. Worsening swelling or bruising °7. Continued bleeding from incision. °8. Increased pain, redness, or drainage from the incision ° ° The clinic staff is available to answer your questions during regular business hours (8:30am-5pm).  Please don’t hesitate to call and ask to speak to one of our nurses for clinical concerns.  ° If you have a medical emergency, go to the nearest emergency room or call 911. ° A surgeon from [facility] is always on call at the hospitals ° ° °[facility] °[address], [address], [place], [place]  [address] ? °MAIN: [phone] ? TOLL FREE: [phone] ?  °FAX [phone] °[email or web address] ° °

## 2015-11-10 NOTE — Anesthesia Postprocedure Evaluation (Signed)
Anesthesia Post Note  Patient: Caroline Peters  Procedure(s) Performed: Procedure(s) (LRB): APPENDECTOMY LAPAROSCOPIC W PARTIAL CECECTOMY  (N/A) LAPAROSCOPIC ILEOCECECTOMY (N/A)  Patient location during evaluation: PACU Anesthesia Type: General Level of consciousness: awake Pain management: pain level controlled Vital Signs Assessment: post-procedure vital signs reviewed and stable Respiratory status: spontaneous breathing Cardiovascular status: stable Anesthetic complications: no    Last Vitals:  Filed Vitals:   11/10/15 1436 11/10/15 1450  BP:  161/68  Pulse: 80 77  Temp:  36.4 C  Resp: 10 12    Last Pain:  Filed Vitals:   11/10/15 1452  PainSc: 1                  EDWARDS,Reino Lybbert

## 2015-11-10 NOTE — Anesthesia Procedure Notes (Signed)
Procedure Name: Intubation Date/Time: 11/10/2015 12:40 PM Performed by: West Pugh Pre-anesthesia Checklist: Patient identified, Emergency Drugs available, Suction available, Patient being monitored and Timeout performed Patient Re-evaluated:Patient Re-evaluated prior to inductionOxygen Delivery Method: Circle system utilized Preoxygenation: Pre-oxygenation with 100% oxygen Intubation Type: IV induction Ventilation: Mask ventilation without difficulty Laryngoscope Size: Mac and 3 Grade View: Grade II Tube type: Oral Tube size: 7.0 mm Number of attempts: 1 Airway Equipment and Method: Stylet Placement Confirmation: ETT inserted through vocal cords under direct vision,  positive ETCO2,  CO2 detector and breath sounds checked- equal and bilateral Secured at: 21 cm Tube secured with: Tape Dental Injury: Teeth and Oropharynx as per pre-operative assessment

## 2015-11-10 NOTE — Op Note (Signed)
Operative Note  Caroline Peters female 70 y.o. 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 She had a lesion at the appendiceal orifice going into the appendix. Biopsy of this lesion was consistent with a serrated adenoma. There was concern that that was not completely removed. She now presents for the above procedure.    Procedure Detail:  She was seen in the holding room. She was brought to the operating room placed supine on the operating table and a general anesthetic was given. A Foley catheter and oral gastric tube were inserted. The abdominal wall was widely sterilely prepped and draped. A Timeout was performed.  Marcaine was infiltrated in the subcutaneous tissues just inferior to the umbilicus. A small infraumbilical incision was made through the skin and subcutaneous tissue until the midline fascia was identified. A small incision was made in the left midline fascia and peritoneum entering the peritoneal cavity under direct vision. A pursestring suture of 0 Vicryl was placed around the edges of the fascia. A Hassan trocar was introduced into the perineal cavity and a pneumoperitoneum created by insufflation of CO2 gas.  The laparoscope was introduced and there is no evidence of bleeding or organ injury. The table was rotated to the left. A 5 mm trocar was placed in the left lower quadrant area. A 5 mm trochars placed in the right upper quadrant. The adhesions between the proximal ascending colon and anterior abdominal wall were noted and divided. I then partially mobilized the cecum by dividing its lateral attachments.  Identified the appendix. There were adhesions between the mesial appendix and lateral abdominal wall were divided with the Harmonic scalpel. I divided the mesial appendix using the Harmonic scalpel down to the base of the appendix. I then identified the ileocecal valve. Using the laparoscopic linear cutting stapler, I resected part of the cecum and the appendix. This did not compromise the ileocecal valve. I opened the specimen on the back table and removed the staple line. The appeared be some residual polyp in the lumen of the appendix. The staple line margin. Because of the positive to me but it was sent to pathology for a final report.  The staple line is inspected and was solid without evidence of leak or bleeding. I irrigated out the area with saline solution and evacuated as much as possible. Second inspection demonstrated no evidence of organ injury, staple line leak, or bleeding.  The [facility] trocar was removed and the subumbilical fold fascia defect closed by tying down the pursestring suture. The remaining trocars were removed and the pneumoperitoneum released. The skin incisions were closed with 4-0 Monocryl subcuticular stitches. Steri-Strips and sterile dressings were applied.  She tolerated the procedure well without any apparent complications and was taken to the recovery room in satisfactory condition.  Estimated Blood Loss:  less than 100 mL                Specimens: appendix and small amount of cecum        Complications:  * No complications entered in OR log *         Disposition: PACU -  hemodynamically stable.         Condition: stable

## 2015-11-10 NOTE — Interval H&P Note (Signed)
History and Physical Interval Note:  11/10/2015 11:54 AM  Caroline Peters  has presented today for surgery, with the diagnosis of Appendeceal serated adenoma  The various methods of treatment have been discussed with the patient and family. After consideration of risks, benefits and other options for treatment, the patient has consented to  Procedure(s): APPENDECTOMY LAPAROSCOPIC W PARTIAL CECECTOMY  (N/A) LAPAROSCOPIC ILEOCECECTOMY (N/A) as a surgical intervention .  The patient's history has been reviewed, patient examined, no change in status, stable for surgery.  I have reviewed the patient's chart and labs.  Questions were answered to the patient's satisfaction.     Campbell Agramonte Lenna Sciara

## 2015-11-10 NOTE — Transfer of Care (Signed)
Immediate Anesthesia Transfer of Care Note  Patient: Caroline Peters  Procedure(s) Performed: Procedure(s): APPENDECTOMY LAPAROSCOPIC W PARTIAL CECECTOMY  (N/A) LAPAROSCOPIC ILEOCECECTOMY (N/A)  Patient Location: PACU  Anesthesia Type:General  Level of Consciousness:  sedated, patient cooperative and responds to stimulation  Airway & Oxygen Therapy:Patient Spontanous Breathing and Patient connected to face mask oxgen  Post-op Assessment:  Report given to PACU RN and Post -op Vital signs reviewed and stable  Post vital signs:  Reviewed and stable  Last Vitals:  Filed Vitals:   11/10/15 1358 11/10/15 1400  BP: 163/89 166/93  Pulse: 102 99  Temp: 36.9 C   Resp: 12 14    Complications: No apparent anesthesia complications

## 2015-11-10 NOTE — H&P (View-Only) (Signed)
Caroline Peters 10/19/2015 11:40 AM Location: [facility] Patient #: [id number] DOB: April 01, 1946 Married / Language: English / Race: White Female  History of Present Illness Caroline Hollingshead MD; 10/19/2015 12:16 PM) Patient words: appendeceal polyp.  The patient is a 70 year old female.   Note:She is referred by Dr. 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 She underwent a follow-up colonoscopy October 03, 2015. She had a previous tubulovillous adenoma removed in 2013. At that time, a polyp was noted in the appendiceal orifice extending into the appendix. Biopsies were consistent with a serrated sessile polyp without dysplasia. This was not able to be removed by way of colonoscopy so she's been referred here for appendectomy. 2 other polyps were removed elsewhere that were benign. No immediate family history of colon cancer. She has a first cousin who had colon cancer. She is an otherwise fairly good health. Her husband is with her today.  Other Problems Jeralyn Ruths, [place]; 10/19/2015 11:40 AM) Depression High blood pressure Hypercholesterolemia Kidney Stone Melanoma Thyroid Disease  Past Surgical History Jeralyn Ruths, [place]; 10/19/2015 11:40 AM) Breast Biopsy Bilateral. Colon Polyp Removal - Colonoscopy Oral Surgery Tonsillectomy  Diagnostic Studies History Jeralyn Ruths, CMA; 10/19/2015 11:40 AM) Colonoscopy within last year Mammogram within last year Pap Smear 1-5 years ago  Allergies Jeralyn Ruths, CMA; 10/19/2015 11:43 AM) Compazine *ANTIPSYCHOTICS/ANTIMANIC AGENTS* PredniSONE (Pak) *CORTICOSTEROIDS*  Medication History Jeralyn Ruths, CMA; 10/19/2015 11:42 AM) Xanax (0.5MG  Tablet, Oral prn) Active. Biotin (1000MCG Tablet, Oral daily) Active. Calcium Carb-Cholecalciferol (600-200MG -UNIT Tablet, Oral daily) Active. Lisinopril (40MG  Tablet, Oral daily)  Active. Crestor (10MG  Tablet, Oral daily) Active. Zoloft (100MG  Tablet, Oral daily) Active. Ambien (5MG  Tablet, Oral prn) Active.  Social History Jeralyn Ruths, CMA; 10/19/2015 11:40 AM) Alcohol use Moderate alcohol use. Caffeine use Carbonated beverages, Coffee, Tea. No drug use Tobacco use Never smoker.  Family History Jeralyn Ruths, [place]; 10/19/2015 11:40 AM) Cancer Father. Diabetes Mellitus Father, Mother. Heart Disease Father, Mother.  Pregnancy / Birth History Jeralyn Ruths, [place]; 10/19/2015 11:40 AM) Age at menarche 46 years. Age of menopause 29-55 Gravida 2 Maternal age 11-30 Para 2     Review of Systems ([place]; 10/19/2015 11:40 AM) HEENT Present- Wears glasses/contact lenses. Not Present- Earache, Hearing Loss, Hoarseness, Nose Bleed, Oral Ulcers, Ringing in the Ears, Seasonal Allergies, Sinus Pain, Sore Throat, Visual Disturbances and Yellow Eyes. Respiratory Not Present- Bloody sputum, Chronic Cough, Difficulty Breathing, Snoring and Wheezing. Breast Not Present- Breast Mass, Breast Pain, Nipple Discharge and Skin Changes. Cardiovascular Not Present- Chest Pain, Difficulty Breathing Lying Down, Leg Cramps, Palpitations, Rapid Heart Rate, Shortness of Breath and Swelling of Extremities. Psychiatric Present- Depression. Not Present- Anxiety, Bipolar, Change in Sleep Pattern, Fearful and Frequent crying. Endocrine Present- Hair Changes. Not Present- Cold Intolerance, Excessive Hunger, Heat Intolerance, Hot flashes and New Diabetes. Hematology Not Present- Easy Bruising, Excessive bleeding, Gland problems, HIV and Persistent Infections.  Vitals Jearld Fenton Morris CMA; 10/19/2015 11:44 AM) 10/19/2015 11:43 AM Weight: 129 lb Height: 62in Body Surface Area: 1.59 m Body Mass Index: 23.59 kg/m  Temp.: 98.56F(Oral)  Pulse: 64 (Regular)  BP: 162/80 (Sitting, Left Arm, Standard)      Physical Exam Caroline Hollingshead MD; 10/19/2015 12:17  PM)  The physical exam findings are as follows: Note:General: WDWN in NAD. Pleasant and cooperative.  HEENT: [place]/AT, no facial masses  EYES: EOMI, no icterus  CV: RRR, no murmur, no JVD.  CHEST: Breath sounds equal and  clear. Respirations nonlabored.  ABDOMEN: Soft, nontender, nondistended, no masses, no organomegaly, active bowel sounds, no scars, no hernias.  MUSCULOSKELETAL: FROM, good muscle tone, no edema, left forearm scar at the site of melanoma excision many years ago.  SKIN: No jaundice.  NEUROLOGIC: Alert and oriented, answers questions appropriately, normal gait and station.  PSYCHIATRIC: Normal mood, affect , and behavior.    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  Plan I recommend a laparoscopic appendectomy with partial cecectomy possible open ileocecectomy if unable to do the other. We also talked about the fact that if we did the laparoscopic appendectomy and partial cecectomy, if there was polyp at the staple line I would have Dr. Hilarie Fredrickson repeat colonoscopy in 6 months. This is long as the final pathology is benign. I have explained the procedure and risks of appendectomy and possible colon resection. Risks include but are not limited to bleeding, infection, wound problems, anesthesia, anastomotic leak, need for colostomy, need for reoperative surgery, injury to intraabominal organs (such as intestine, spleen, kidney, bladder, ureter, etc.), ileus, irregular bowel habits. She seems to understand and agrees with the plan.  Jackolyn Confer, MD

## 2015-11-10 NOTE — Anesthesia Preprocedure Evaluation (Addendum)
Anesthesia Evaluation  Patient identified by MRN, date of birth, ID band Patient awake    Reviewed: Allergy & Precautions, NPO status   Airway Mallampati: II  TM Distance: >3 FB Neck ROM: Full    Dental   Pulmonary neg pulmonary ROS,    breath sounds clear to auscultation       Cardiovascular hypertension,  Rhythm:Regular Rate:Normal     Neuro/Psych    GI/Hepatic negative GI ROS, Neg liver ROS,   Endo/Other  Hypothyroidism   Renal/GU Renal disease     Musculoskeletal   Abdominal   Peds  Hematology   Anesthesia Other Findings   Reproductive/Obstetrics                            Anesthesia Physical Anesthesia Plan  ASA: III  Anesthesia Plan: General   Post-op Pain Management:    Induction: Intravenous  Airway Management Planned: Oral ETT  Additional Equipment:   Intra-op Plan:   Post-operative Plan: Extubation in OR  Informed Consent: I have reviewed the patients History and Physical, chart, labs and discussed the procedure including the risks, benefits and alternatives for the proposed anesthesia with the patient or authorized representative who has indicated his/her understanding and acceptance.   Dental advisory given  Plan Discussed with: CRNA and Anesthesiologist  Anesthesia Plan Comments:         Anesthesia Quick Evaluation

## 2015-11-11 DIAGNOSIS — K388 Other specified diseases of appendix: Secondary | ICD-10-CM | POA: Diagnosis not present

## 2015-11-11 MED ORDER — HYDROCODONE-ACETAMINOPHEN 5-325 MG PO TABS: 1.0000 | ORAL_TABLET | ORAL | Status: DC | PRN

## 2015-11-11 NOTE — Discharge Summary (Signed)
Physician Discharge Summary  Patient ID: Caroline Peters MRN: [id number] DOB/AGE: [birth year] 70 y.o.  Admit date: 11/10/2015 Discharge date: 11/11/2015  Admission Diagnoses:  Serrated adenoma of the appendix  Discharge Diagnoses:  Active Problems:   Adenoma of appendix s/p lap appendectomy and partial cecectomy 11/10/15   Discharged Condition: good  Hospital Course: She underwent the above procedure and tolerated it well.  She was able to be discharged on POD #1.  Discharge instructions were given to her.   Discharge Exam: Blood pressure 107/56, pulse 63, temperature 98.3 F (36.8 C), temperature source Oral, resp. rate 16, height 5' (1.524 m), weight 57.607 kg (127 lb), SpO2 95 %.   Disposition: 01-Home or Self Care     Medication List    TAKE these medications        ALPRAZolam 0.5 MG tablet  Commonly known as:  XANAX  TAKE 1/2 TO 1 TABLET TWICE DAILY AS NEEDED.     Biotin 1000 MCG tablet  Take 1,000 mcg by mouth daily.     Calcium + D3 600-200 MG-UNIT Tabs  Take 1 tablet by mouth daily.     HYDROcodone-acetaminophen 5-325 MG tablet  Commonly known as:  NORCO/VICODIN  Take 1-2 tablets by mouth every 4 (four) hours as needed for moderate pain.     levothyroxine 100 MCG tablet  Commonly known as:  SYNTHROID, LEVOTHROID  Take 1 tablet (100 mcg total) by mouth daily.     lisinopril 40 MG tablet  Commonly known as:  PRINIVIL,ZESTRIL  TAKE 1 TABLET EACH DAY.     rosuvastatin 10 MG tablet  Commonly known as:  CRESTOR  TAKE 1 TABLET ONCE DAILY.     sertraline 100 MG tablet  Commonly known as:  ZOLOFT  TAKE 1 & 1/2 TABLETS A DAY.     zolpidem 5 MG tablet  Commonly known as:  AMBIEN  Half to one tablet at bedtime as needed for sleep         Signed: Ezri Landers J 11/11/2015, 10:59 AM

## 2015-11-11 NOTE — Progress Notes (Signed)
1 Day Post-Op  Subjective: Adequate pain control.  Walking.  Tolerating diet.  Objective: Vital signs in last 24 hours: Temp:  [97.5 F (36.4 C)-98.6 F (37 C)] 98.3 F (36.8 C) (03/10 0514) Pulse Rate:  [60-102] 63 (03/10 0514) Resp:  [9-16] 16 (03/10 0514) BP: (107-174)/(55-93) 107/56 mmHg (03/10 0514) SpO2:  [93 %-100 %] 95 % (03/10 0514) Weight:  [57.607 kg (127 lb)] 57.607 kg (127 lb) (03/09 1500) Last BM Date: 11/09/15 (PTA)  Intake/Output from previous day: 03/09 0701 - 03/10 0700 In: 1946.7 [I.V.:1946.7] Out: 1085 [Urine:1085] Intake/Output this shift: Total I/O In: -  Out: 450 [Urine:450]  PE: General- In NAD Abdomen-soft, dressings dry  Lab Results:  No results for input(s): WBC, HGB, HCT, PLT in the last 72 hours. BMET No results for input(s): NA, K, CL, CO2, GLUCOSE, BUN, CREATININE, CALCIUM in the last 72 hours. PT/INR No results for input(s): LABPROT, INR in the last 72 hours. Comprehensive Metabolic Panel:    Component Value Date/Time   NA 143 11/03/2015 0920   NA 139 02/15/2015 0904   K 4.6 11/03/2015 0920   K 4.1 02/15/2015 0904   CL 108 11/03/2015 0920   CL 104 02/15/2015 0904   CO2 27 11/03/2015 0920   CO2 26 02/15/2015 0904   BUN 19 11/03/2015 0920   BUN 16 02/15/2015 0904   CREATININE 0.82 11/03/2015 0920   CREATININE 0.77 02/15/2015 0904   GLUCOSE 75 11/03/2015 0920   GLUCOSE 81 02/15/2015 0904   CALCIUM 10.0 11/03/2015 0920   CALCIUM 10.3 02/15/2015 0904   AST 22 11/03/2015 0920   AST 18 11/17/2014 1143   ALT 21 11/03/2015 0920   ALT 17 11/17/2014 1143   ALKPHOS 76 11/03/2015 0920   ALKPHOS 59 11/17/2014 1143   BILITOT 0.6 11/03/2015 0920   BILITOT 0.5 11/17/2014 1143   PROT 7.6 11/03/2015 0920   PROT 7.1 11/17/2014 1143   ALBUMIN 5.0 11/03/2015 0920   ALBUMIN 4.8 11/17/2014 1143     Studies/Results: No results found.  Anti-infectives: Anti-infectives    Start     Dose/Rate Route Frequency Ordered Stop   11/10/15 2000   ceFAZolin (ANCEF) IVPB 2 g/50 mL premix     2 g 100 mL/hr over 30 Minutes Intravenous 3 times per day 11/10/15 1456 11/10/15 2130   11/10/15 1021  cefoTEtan in Dextrose 5% (CEFOTAN) IVPB 2 g     2 g Intravenous On call to O.R. 11/10/15 1021 11/10/15 1241      Assessment Active Problems:   Adenoma of appendix s/p laparoscopic appendectomy and partial cecectomy-doing well.     LOS: 1 day   Plan: Discharge.  Instructions given to her.   Octavious Zidek J 11/11/2015

## 2015-11-11 NOTE — Progress Notes (Signed)
Pt's vitals are WNL, pain is under control and pt is tolerating diet. Discuss discharge instructions with patient. Discharged to home with prescription.

## 2015-12-02 ENCOUNTER — Other Ambulatory Visit: Payer: Self-pay | Admitting: Family Medicine

## 2015-12-02 NOTE — Telephone Encounter (Signed)
Spoke with pt and she states the surgery is behind her now and everything is coming back to normal for her so she not be using these as frequent.

## 2015-12-02 NOTE — Telephone Encounter (Signed)
Yes thanks. She previously told me 30 lasts her a year. It has only been 3 months at this point. Would encourage less frequent use and if anxiety is so severe she needs o tmore regularly we need to see each other and consider higher zoloft dose

## 2015-12-02 NOTE — Telephone Encounter (Signed)
Refill ok? 

## 2015-12-16 ENCOUNTER — Other Ambulatory Visit: Payer: Self-pay | Admitting: Family Medicine

## 2016-01-17 ENCOUNTER — Other Ambulatory Visit: Payer: Self-pay | Admitting: *Deleted

## 2016-01-17 NOTE — Telephone Encounter (Signed)
Yes thanks, may give 2 months but needs to schedule follow up within that timeframe

## 2016-01-17 NOTE — Telephone Encounter (Signed)
Received fax from [facility] requesting refill for zolpidem (AMBIEN) 5 MG tablet   Last OV 05/20/15... Last filled 06/28/15, #30 with 5 refills... Okay to refill?

## 2016-01-18 MED ORDER — ZOLPIDEM TARTRATE 5 MG PO TABS: ORAL_TABLET | ORAL | Status: DC

## 2016-01-18 NOTE — Telephone Encounter (Signed)
Rx signed and faxed to pharmacy

## 2016-02-06 ENCOUNTER — Encounter: Payer: Self-pay | Admitting: Family Medicine

## 2016-02-06 ENCOUNTER — Ambulatory Visit (INDEPENDENT_AMBULATORY_CARE_PROVIDER_SITE_OTHER): Payer: Medicare Other | Admitting: Family Medicine

## 2016-02-06 VITALS — BP 120/70 | HR 64 | Temp 97.9°F | Ht 60.0 in | Wt 128.0 lb

## 2016-02-06 DIAGNOSIS — I10 Essential (primary) hypertension: Secondary | ICD-10-CM | POA: Diagnosis not present

## 2016-02-06 DIAGNOSIS — F329 Major depressive disorder, single episode, unspecified: Secondary | ICD-10-CM

## 2016-02-06 DIAGNOSIS — E785 Hyperlipidemia, unspecified: Secondary | ICD-10-CM

## 2016-02-06 DIAGNOSIS — Z23 Encounter for immunization: Secondary | ICD-10-CM

## 2016-02-06 DIAGNOSIS — E032 Hypothyroidism due to medicaments and other exogenous substances: Secondary | ICD-10-CM | POA: Diagnosis not present

## 2016-02-06 DIAGNOSIS — C4360 Malignant melanoma of unspecified upper limb, including shoulder: Secondary | ICD-10-CM

## 2016-02-06 DIAGNOSIS — F32A Depression, unspecified: Secondary | ICD-10-CM

## 2016-02-06 MED ORDER — ROSUVASTATIN CALCIUM 10 MG PO TABS: 10.0000 mg | ORAL_TABLET | Freq: Every day | ORAL | Status: DC

## 2016-02-06 MED ORDER — SERTRALINE HCL 100 MG PO TABS: 100.0000 mg | ORAL_TABLET | Freq: Every day | ORAL | Status: DC

## 2016-02-06 NOTE — Assessment & Plan Note (Signed)
S: controlled on zoloft with sparing xanax for anxiety portion. #30 xanax still lasting over a year for anxiety portion. PHQ2 of 0 A/P: refilled zoloft- changed dose to be more clear at one a day of 100mg  tablet zoloft.

## 2016-02-06 NOTE — Progress Notes (Signed)
Subjective:  Caroline Peters is a 70 y.o. year old very pleasant female patient who presents for/with See problem oriented charting ROS- No chest pain or shortness of breath. No headache or blurry vision. see any ROS included in HPI as well.   Past Medical History-  Patient Active Problem List   Diagnosis Date Noted  . Insomnia 06/23/2014    Priority: Medium  . Essential hypertension, benign 03/03/2014    Priority: Medium  . Hyperlipidemia 12/11/2011    Priority: Medium  . Hypothyroid     Priority: Medium  . Depression 04/16/2007    Priority: Medium  . History of adenomatous polyp of colon 05/20/2015    Priority: Low  . Hair loss 05/20/2015    Priority: Low  . HA (headache)     Priority: Low  . Melanoma     Priority: Low  . Osteopenia     Priority: Low  . 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 ALPRAZolam (XANAX) 0.5 MG tablet TAKE 1/2 TABLET TWICE DAILY AS NEEDED. 30 tablet 5  . Biotin 1000 MCG tablet Take 1,000 mcg by mouth daily.    . Calcium Carb-Cholecalciferol (CALCIUM + D3) 600-200 MG-UNIT TABS Take 1 tablet by mouth daily.     Marland Kitchen levothyroxine (SYNTHROID, LEVOTHROID) 100 MCG tablet TAKE 1 TABLET ONCE DAILY. 30 tablet 5  . lisinopril (PRINIVIL,ZESTRIL) 40 MG tablet TAKE 1 TABLET EACH DAY. 90 tablet 2  . rosuvastatin (CRESTOR) 10 MG tablet Take 1 tablet (10 mg total) by mouth daily. 90 tablet 3  . sertraline (ZOLOFT) 100 MG tablet Take 1 tablet (100 mg total) by mouth daily. 90 tablet 3  . zolpidem (AMBIEN) 5 MG tablet Half to one tablet at bedtime as needed for sleep 30 tablet 1   No current facility-administered medications for this visit.    Objective: BP 120/70 mmHg  Pulse 64  Temp(Src) 97.9 F (36.6 C) (Oral)  Ht 5' (1.524 m)  Wt 128 lb (58.06 kg)  BMI 25.00 kg/m2 Gen: NAD, resting comfortably No thyromegaly CV: RRR no murmurs rubs or gallops Lungs: CTAB no  crackles, wheeze, rhonchi Abdomen: soft/nontender/nondistended/normal bowel sounds. No rebound or guarding.  Ext: no edema Skin: warm, dry, no rash Neuro: grossly normal, moves all extremities  Assessment/Plan:  Depression S: controlled on zoloft with sparing xanax for anxiety portion. #30 xanax still lasting over a year for anxiety portion. PHQ2 of 0 A/P: refilled zoloft- changed dose to be more clear at one a day of 100mg  tablet zoloft.   Hypertension S: controlled on lisinopril 40mg  BP Readings from Last 3 Encounters:  11/11/15 107/56  11/03/15 143/64  10/03/15 121/72  A/P:Continue current meds:  Doing well  Hypothyroidism S: controlled Lab Results  Component Value Date   TSH 0.83 02/15/2015   On thyroid medication-levothyroxine 182mcg ROS-No hair or nail changes. No heat/cold intolerance. No constipation or diarrhea. Denies shakiness or anxiety.  A/P: previously had some weight loss but this has stabilized. Continue current medication. Also following with Dr. Tonia Brooms for hair loss, previously bu tneeds new dermatologist with melanoma history and referred to [facility] dermatology.   Hyperlipidemia S: previously controlled on crestor 10mg . No myalgias.  Lab Results  Component Value Date   CHOL 207* 03/03/2013   HDL 108.20 03/03/2013   LDLCALC 87 12/11/2011   LDLDIRECT 85.2 03/03/2013   TRIG 56.0 03/03/2013   CHOLHDL 2 03/03/2013   A/P: advised CPE  within next few months and update lipids  3 month CPE with soon repeat diagnostic mammogram- follow up to make sure has had this by follow up  Return in about 3 months (around 05/08/2016) for physical. with labs a few days before. , physical. Return precautions advised.   Orders Placed This Encounter  Procedures  . TSH    Standing Status: Future     Number of Occurrences:      Standing Expiration Date: 02/05/2017  . Lipid panel    Standing Status: Future     Number of Occurrences:      Standing Expiration Date: 02/05/2017  . CBC     Standing Status: Future     Number of Occurrences:      Standing Expiration Date: 02/05/2017  . Comprehensive metabolic panel    [place]    Standing Status: Future     Number of Occurrences:      Standing Expiration Date: 02/05/2017  . Ambulatory referral to Dermatology    Referral Priority:  Routine    Referral Type:  Consultation    Referral Reason:  Specialty Services Required    Requested Specialty:  Dermatology    Number of Visits Requested:  1    Meds ordered this encounter  Medications  . rosuvastatin (CRESTOR) 10 MG tablet    Sig: Take 1 tablet (10 mg total) by mouth daily.    Dispense:  90 tablet    Refill:  3  . sertraline (ZOLOFT) 100 MG tablet    Sig: Take 1 tablet (100 mg total) by mouth daily.    Dispense:  90 tablet    Refill:  3    Garret Reddish, MD

## 2016-02-06 NOTE — Patient Instructions (Addendum)
Prevnar 13 today  We will call you within a week about your referral to dermatology. If you do not hear within 2 weeks, give [place] a call.   No changes in medications

## 2016-02-14 DIAGNOSIS — Z803 Family history of malignant neoplasm of breast: Secondary | ICD-10-CM | POA: Diagnosis not present

## 2016-02-14 LAB — HM MAMMOGRAPHY

## 2016-02-16 ENCOUNTER — Encounter: Payer: Self-pay | Admitting: Family Medicine

## 2016-02-17 ENCOUNTER — Other Ambulatory Visit: Payer: Self-pay | Admitting: Family Medicine

## 2016-02-17 NOTE — Telephone Encounter (Signed)
Rx refill sent to pharmacy. 

## 2016-03-30 DIAGNOSIS — D2271 Melanocytic nevi of right lower limb, including hip: Secondary | ICD-10-CM | POA: Diagnosis not present

## 2016-03-30 DIAGNOSIS — L821 Other seborrheic keratosis: Secondary | ICD-10-CM | POA: Diagnosis not present

## 2016-03-30 DIAGNOSIS — D2372 Other benign neoplasm of skin of left lower limb, including hip: Secondary | ICD-10-CM | POA: Diagnosis not present

## 2016-03-30 DIAGNOSIS — D225 Melanocytic nevi of trunk: Secondary | ICD-10-CM | POA: Diagnosis not present

## 2016-03-30 DIAGNOSIS — L814 Other melanin hyperpigmentation: Secondary | ICD-10-CM | POA: Diagnosis not present

## 2016-03-30 DIAGNOSIS — Z85820 Personal history of malignant melanoma of skin: Secondary | ICD-10-CM | POA: Diagnosis not present

## 2016-05-04 ENCOUNTER — Other Ambulatory Visit (INDEPENDENT_AMBULATORY_CARE_PROVIDER_SITE_OTHER): Payer: Medicare Other

## 2016-05-04 DIAGNOSIS — E032 Hypothyroidism due to medicaments and other exogenous substances: Secondary | ICD-10-CM

## 2016-05-04 DIAGNOSIS — I10 Essential (primary) hypertension: Secondary | ICD-10-CM | POA: Diagnosis not present

## 2016-05-04 DIAGNOSIS — E785 Hyperlipidemia, unspecified: Secondary | ICD-10-CM | POA: Diagnosis not present

## 2016-05-04 LAB — CBC
HEMATOCRIT: 35.9 % — AB (ref 36.0–46.0)
Hemoglobin: 12.1 g/dL (ref 12.0–15.0)
MCHC: 33.8 g/dL (ref 30.0–36.0)
MCV: 91.5 fl (ref 78.0–100.0)
Platelets: 182 10*3/uL (ref 150.0–400.0)
RBC: 3.92 Mil/uL (ref 3.87–5.11)
RDW: 14.8 % (ref 11.5–15.5)
WBC: 5.1 10*3/uL (ref 4.0–10.5)

## 2016-05-04 LAB — POC URINALSYSI DIPSTICK (AUTOMATED)
BILIRUBIN UA: NEGATIVE
Blood, UA: NEGATIVE
GLUCOSE UA: NEGATIVE
KETONES UA: NEGATIVE
LEUKOCYTES UA: NEGATIVE
Nitrite, UA: NEGATIVE
Protein, UA: NEGATIVE
Spec Grav, UA: 1.02
Urobilinogen, UA: 0.2
pH, UA: 5

## 2016-05-04 LAB — COMPREHENSIVE METABOLIC PANEL
ALT: 18 U/L (ref 0–35)
AST: 17 U/L (ref 0–37)
Albumin: 4.6 g/dL (ref 3.5–5.2)
Alkaline Phosphatase: 71 U/L (ref 39–117)
BUN: 16 mg/dL (ref 6–23)
CHLORIDE: 107 meq/L (ref 96–112)
CO2: 27 meq/L (ref 19–32)
Calcium: 9.5 mg/dL (ref 8.4–10.5)
Creatinine, Ser: 0.72 mg/dL (ref 0.40–1.20)
GFR: 84.93 mL/min (ref >60.00)
GLUCOSE: 83 mg/dL (ref 70–99)
POTASSIUM: 4.3 meq/L (ref 3.5–5.1)
SODIUM: 141 meq/L (ref 135–145)
Total Bilirubin: 0.5 mg/dL (ref 0.2–1.2)
Total Protein: 6.6 g/dL (ref 6.0–8.3)

## 2016-05-04 LAB — LIPID PANEL
CHOLESTEROL: 210 mg/dL — AB (ref 0–200)
HDL: 92.7 mg/dL (ref >39.00)
LDL CALC: 104 mg/dL — AB (ref 0–99)
NonHDL: 117.44
Total CHOL/HDL Ratio: 2
Triglycerides: 69 mg/dL (ref 0.0–149.0)
VLDL: 13.8 mg/dL (ref 0.0–40.0)

## 2016-05-04 LAB — TSH: TSH: 0.34 u[IU]/mL — ABNORMAL LOW (ref 0.35–4.50)

## 2016-05-11 ENCOUNTER — Encounter: Payer: Self-pay | Admitting: Family Medicine

## 2016-05-11 ENCOUNTER — Ambulatory Visit (INDEPENDENT_AMBULATORY_CARE_PROVIDER_SITE_OTHER): Payer: Medicare Other | Admitting: Family Medicine

## 2016-05-11 VITALS — BP 128/68 | HR 74 | Temp 97.6°F | Ht 62.5 in | Wt 132.2 lb

## 2016-05-11 DIAGNOSIS — R6889 Other general symptoms and signs: Secondary | ICD-10-CM

## 2016-05-11 DIAGNOSIS — F329 Major depressive disorder, single episode, unspecified: Secondary | ICD-10-CM

## 2016-05-11 DIAGNOSIS — E785 Hyperlipidemia, unspecified: Secondary | ICD-10-CM | POA: Diagnosis not present

## 2016-05-11 DIAGNOSIS — E032 Hypothyroidism due to medicaments and other exogenous substances: Secondary | ICD-10-CM

## 2016-05-11 DIAGNOSIS — Z0001 Encounter for general adult medical examination with abnormal findings: Secondary | ICD-10-CM

## 2016-05-11 DIAGNOSIS — M8580 Other specified disorders of bone density and structure, unspecified site: Secondary | ICD-10-CM

## 2016-05-11 DIAGNOSIS — F32A Depression, unspecified: Secondary | ICD-10-CM

## 2016-05-11 NOTE — Progress Notes (Signed)
Pre visit review using our clinic review tool, if applicable. No additional management support is needed unless otherwise documented below in the visit note. 

## 2016-05-11 NOTE — Progress Notes (Signed)
Phone: [phone]  Subjective:  Patient presents today for their annual physical. Chief complaint-noted.   See problem oriented charting- ROS- full  review of systems was completed and negative including No chest pain or shortness of breath. No headache or blurry vision. Mild weight gain, no weigh tloss despite slightly low TSH  The following were reviewed and entered/updated in epic: Past Medical History:  Diagnosis Date  . Anxiety   . Cancer ([place])    Melanoma- on arm age 58   . Chronic kidney disease    kidney stone age 36  . DEPRESSION 04/16/2007  . Fibroadenoma of breast   . HA (headache)    hx of   . Hyperlipidemia   . Hypertension   . Hypothyroid   . HYPOTHYROIDISM 04/16/2007  . Osteopenia 06/2013   T score -2.0 FRAX 8.8%/0.8% stable from prior DEXA 2012   Patient Active Problem List   Diagnosis Date Noted  . Insomnia 06/23/2014    Priority: Medium  . Essential hypertension, benign 03/03/2014    Priority: Medium  . Hyperlipidemia 12/11/2011    Priority: Medium  . Osteopenia     Priority: Medium  . Hypothyroid     Priority: Medium  . Depression 04/16/2007    Priority: Medium  . History of adenomatous polyp of colon 05/20/2015    Priority: Low  . Hair loss 05/20/2015    Priority: Low  . HA (headache)     Priority: Low  . Melanoma     Priority: Low  . 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 BREAST SURGERY     bx left breast- and removal of fibroadenoma  . COLONOSCOPY    . EXCISION OF MELANOMA  1978  . HYSTEROSCOPY  1988   HYSTEROSCOPY,D&C  . LAPAROSCOPIC APPENDECTOMY N/A 11/10/2015   Procedure: APPENDECTOMY LAPAROSCOPIC W PARTIAL CECECTOMY ;  Surgeon: Jackolyn Confer, MD;  Location: [facility];  Service: General;  Laterality: N/A;  . LAPAROSCOPIC ILEOCECECTOMY N/A 11/10/2015   Procedure: LAPAROSCOPIC ILEOCECECTOMY;  Surgeon: Jackolyn Confer, MD;  Location: [facility];  Service: General;  Laterality: N/A;  . MASS  EXCISION Right 02/23/2014   Procedure: BIOPSY TEMPORAL ARTERY RIGHT;  Surgeon: Rozetta Nunnery, MD;  Location: [facility];  Service: ENT;  Laterality: Right;  . POLYPECTOMY      Family History  Problem Relation Age of Onset  . Diabetes Mother   . Heart disease Mother 33  . Dementia Mother   . Diabetes Father   . Heart disease Father 29  . Cancer Father     Skin cancer-led to death  . Breast cancer Maternal Grandmother     Age 66  . Stomach cancer Neg Hx   . Colon polyps Neg Hx   . Esophageal cancer Neg Hx   . Rectal cancer Neg Hx   . Colon cancer Other     first cousin dx'd in his 71's    Medications- reviewed and updated Current Outpatient Prescriptions  Medication Sig Dispense Refill  . ALPRAZolam (XANAX) 0.5 MG tablet TAKE 1/2 TABLET TWICE DAILY AS NEEDED. 30 tablet 5  . Biotin 1000 MCG tablet Take 1,000 mcg by mouth daily.    . Calcium Carb-Cholecalciferol (CALCIUM + D3) 600-200 MG-UNIT TABS Take 1 tablet by mouth daily.     Marland Kitchen levothyroxine (SYNTHROID, LEVOTHROID) 100 MCG tablet TAKE 1 TABLET ONCE DAILY. 30 tablet 5  . lisinopril (PRINIVIL,ZESTRIL) 40 MG tablet TAKE 1 TABLET EACH DAY.  90 tablet 0  . rosuvastatin (CRESTOR) 10 MG tablet Take 1 tablet (10 mg total) by mouth daily. 90 tablet 3  . sertraline (ZOLOFT) 100 MG tablet Take 1 tablet (100 mg total) by mouth daily. 90 tablet 3  . zolpidem (AMBIEN) 5 MG tablet Half to one tablet at bedtime as needed for sleep 30 tablet 1   No current facility-administered medications for this visit.     Allergies-reviewed and updated Allergies  Allergen Reactions  . Compazine Other (See Comments)    extrapyramidal syndrome  . Prednisone Other (See Comments)    Mood swings - crying     Social History   Social History  . Marital status: Married    Spouse name: [name]  . Number of children: N/A  . Years of education: college   Social History Main Topics  . Smoking status: Never Smoker  . Smokeless  tobacco: Never Used  . Alcohol use 8.4 oz/week    14 Standard drinks or equivalent per week     Comment: wine daily 14 glasses per week  . Drug use: No  . Sexual activity: Yes    Birth control/ protection: Post-menopausal   Other Topics Concern  . None   Social History Narrative   Patient lives at home with her husband [name]). 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      Education : 2 years [employer], 2 years [employer]      Hobbies: grandchildren time, garden, reading, [employer]      Right handed. Caffeine one cup daily    Objective: BP 128/68 (BP Location: Left Arm, Patient Position: Sitting, Cuff Size: Normal)   Pulse 74   Temp 97.6 F (36.4 C) (Oral)   Ht 5' 2.5" (1.588 m)   Wt 132 lb 3.2 oz (60 kg)   SpO2 95%   BMI 23.79 kg/m  Gen: NAD, resting comfortably HEENT: Mucous membranes are moist. Oropharynx normal Neck: no thyromegaly CV: RRR no murmurs rubs or gallops Lungs: CTAB no crackles, wheeze, rhonchi Abdomen: soft/nontender/nondistended/normal bowel sounds. No rebound or guarding.  Ext: no edema Skin: warm, dry Neuro: grossly normal, moves all extremities, PERRLA  Assessment/Plan:  70 y.o. female presenting for annual physical.  Health Maintenance counseling: 1. Anticipatory guidance: Patient counseled regarding regular dental exams, eye exams, wearing seatbelts.  2. Risk factor reduction:  Advised patient of need for regular exercise and diet rich and fruits and vegetables to reduce risk of heart attack and stroke.  3. Immunizations/screenings/ancillary studies- will do flu shot in october Immunization History  Administered Date(s) Administered  . Influenza Split 06/05/2011, 06/03/2012  . Influenza Whole 07/09/2007, 06/04/2008, 06/28/2009, 07/12/2010  . Influenza, High Dose Seasonal PF 06/30/2013, 07/07/2014, 07/14/2015  . Pneumococcal Conjugate-13  02/06/2016  . Pneumococcal Polysaccharide-23 07/12/2010, 06/05/2011  . Td 10/08/2008  . Zoster 05/04/2010   4. Cervical cancer screening- passed age based screening recommendations, no longer seeing Dr. Phineas Real 5. Breast cancer screening-   mammogram diagnostic  6 months after biopsy in January was normal, now back to 1 year. Biopsy showed cutal hyperplasia, fibroadenoma, fibrocystic changes 6. Colon cancer screening - 07/2015 with 3 year repeat 7. Skin cancer screening- melanoma history- sees dermatology once a year  Status of chronic or acute concerns  Insomnia- controlled with ambien 5 mg  Hyperlipidemia- HTN- controlled on lisinopril BP Readings from Last 3  Encounters:  05/11/16 128/68  02/06/16 120/70  11/11/15 (!) 107/56    Hypothyroid mild overtreatment- will not adjust at this time, recheck 1 month  Hyperlipidemia controlled on crestor 10mg  in past, mild poor control at present. HDL so good- no changes planned today- repeat 1 year Lab Results  Component Value Date   CHOL 210 (H) 05/04/2016   HDL 92.70 05/04/2016   LDLCALC 104 (H) 05/04/2016   LDLDIRECT 85.2 03/03/2013   TRIG 69.0 05/04/2016   CHOLHDL 2 05/04/2016     Depression controlled on zoloft 100mg , xanax #30 lasts a year. PHQ2 of 0 despite some very real stressors including prior infidelity from husband  Osteopenia Never had bisphosphonate. Dr. Phineas Real ob/gyn prior- 2014 no increased fracture risk. Repeat perhaps 2018 or 2019. vit D advised   Return in about 1 year (around 05/11/2017) for physical.  Orders Placed This Encounter  Procedures  . TSH    Standing Status:   Future    Standing Expiration Date:   05/11/2017   Return precautions advised.   Garret Reddish, MD

## 2016-05-11 NOTE — Assessment & Plan Note (Signed)
controlled on zoloft 100mg , xanax #30 lasts a year. PHQ2 of 0 despite some very real stressors including prior infidelity from husband

## 2016-05-11 NOTE — Assessment & Plan Note (Signed)
mild overtreatment- will not adjust at this time, recheck 1 month

## 2016-05-11 NOTE — Assessment & Plan Note (Signed)
Never had bisphosphonate. Dr. Phineas Real ob/gyn prior- 2014 no increased fracture risk. Repeat perhaps 2018 or 2019. vit D advised

## 2016-05-11 NOTE — Assessment & Plan Note (Signed)
controlled on crestor 10mg  in past, mild poor control at present. HDL so good- no changes planned today- repeat 1 year Lab Results  Component Value Date   CHOL 210 (H) 05/04/2016   HDL 92.70 05/04/2016   LDLCALC 104 (H) 05/04/2016   LDLDIRECT 85.2 03/03/2013   TRIG 69.0 05/04/2016   CHOLHDL 2 05/04/2016

## 2016-05-11 NOTE — Patient Instructions (Addendum)
Get your flu shot in October- let us know if you do not get it here  Start vitamin D alone [phone] IU (2000 would likely be fine as well)  Thyroid and cholesterol were slightly off- let's not make any changes and recheck in a year. Check thyroid in 1 month though just to be sure level not going further into abnormal range (lab visit only)

## 2016-05-13 ENCOUNTER — Other Ambulatory Visit: Payer: Self-pay | Admitting: Family Medicine

## 2016-06-04 ENCOUNTER — Other Ambulatory Visit: Payer: Self-pay | Admitting: Family Medicine

## 2016-06-08 ENCOUNTER — Other Ambulatory Visit: Payer: Medicare Other

## 2016-06-11 ENCOUNTER — Ambulatory Visit (INDEPENDENT_AMBULATORY_CARE_PROVIDER_SITE_OTHER): Payer: Medicare Other | Admitting: Family Medicine

## 2016-06-11 ENCOUNTER — Other Ambulatory Visit (INDEPENDENT_AMBULATORY_CARE_PROVIDER_SITE_OTHER): Payer: Medicare Other

## 2016-06-11 DIAGNOSIS — Z23 Encounter for immunization: Secondary | ICD-10-CM

## 2016-06-11 DIAGNOSIS — E032 Hypothyroidism due to medicaments and other exogenous substances: Secondary | ICD-10-CM | POA: Diagnosis not present

## 2016-06-11 LAB — TSH: TSH: 0.14 u[IU]/mL — ABNORMAL LOW (ref 0.35–4.50)

## 2016-06-12 ENCOUNTER — Other Ambulatory Visit: Payer: Self-pay

## 2016-06-12 DIAGNOSIS — E039 Hypothyroidism, unspecified: Secondary | ICD-10-CM

## 2016-06-12 MED ORDER — LEVOTHYROXINE SODIUM 88 MCG PO TABS: 88.0000 ug | ORAL_TABLET | Freq: Every day | ORAL | 3 refills | Status: DC

## 2016-06-19 ENCOUNTER — Other Ambulatory Visit: Payer: Self-pay | Admitting: Family Medicine

## 2016-07-24 ENCOUNTER — Other Ambulatory Visit (INDEPENDENT_AMBULATORY_CARE_PROVIDER_SITE_OTHER): Payer: Medicare Other

## 2016-07-24 DIAGNOSIS — E039 Hypothyroidism, unspecified: Secondary | ICD-10-CM

## 2016-07-24 LAB — TSH: TSH: 0.96 u[IU]/mL (ref 0.35–4.50)

## 2016-08-01 ENCOUNTER — Other Ambulatory Visit: Payer: Self-pay | Admitting: Family Medicine

## 2016-08-06 DIAGNOSIS — H2513 Age-related nuclear cataract, bilateral: Secondary | ICD-10-CM | POA: Diagnosis not present

## 2016-08-14 ENCOUNTER — Encounter: Payer: Self-pay | Admitting: *Deleted

## 2016-09-13 ENCOUNTER — Encounter: Payer: Self-pay | Admitting: Internal Medicine

## 2016-10-04 ENCOUNTER — Telehealth: Payer: Self-pay | Admitting: Family Medicine

## 2016-10-04 NOTE — Telephone Encounter (Signed)
Noted  

## 2016-10-04 NOTE — Telephone Encounter (Signed)
°  Patient Name: Caroline Peters  DOB: [birth year]    Initial Comment Patrcia Dolly states she has a cough and runny nose. She is needing suggestions of OTC meds for cough.   Nurse Assessment  Nurse: Julien Girt, RN, Almyra Free Date/Time Eilene Ghazi Time): 10/04/2016 2:44:01 PM  Confirm and document reason for call. If symptomatic, describe symptoms. ---Caller states she has a cough and nasal congestion. Her sx began on Sunday and she has gotten worse. She is taking OTC Claritin and some of her husband's cough medication. No fever, denies wheezing or difficulty breathing.  Does the patient have any new or worsening symptoms? ---Yes  Will a triage be completed? ---Yes  Related visit to physician within the last 2 weeks? ---No  Does the PT have any chronic conditions? (i.e. diabetes, asthma, etc.) ---Yes  List chronic conditions. ---Htn, Hypothyroid , Depression, High Cholesterol, Insomnia  Is this a behavioral health or substance abuse call? ---No     Guidelines    Guideline Title Affirmed Question Affirmed Notes  Cough - Acute Productive SEVERE coughing spells (e.g., whooping sound after coughing, vomiting after coughing)    Final Disposition User   See Physician within 24 Hours Julien Girt, RN, Almyra Free    Comments  [email or web address]   Referrals  REFERRED TO PCP OFFICE   Disagree/Comply: Comply   Recommended caller try Coricidin HBP due to htn

## 2016-10-05 ENCOUNTER — Encounter: Payer: Self-pay | Admitting: Family Medicine

## 2016-10-05 ENCOUNTER — Ambulatory Visit (INDEPENDENT_AMBULATORY_CARE_PROVIDER_SITE_OTHER): Payer: Medicare Other | Admitting: Family Medicine

## 2016-10-05 VITALS — BP 150/78 | HR 66 | Temp 98.1°F | Ht 62.5 in | Wt 135.0 lb

## 2016-10-05 DIAGNOSIS — J0100 Acute maxillary sinusitis, unspecified: Secondary | ICD-10-CM

## 2016-10-05 MED ORDER — AMOXICILLIN-POT CLAVULANATE 875-125 MG PO TABS: 1.0000 | ORAL_TABLET | Freq: Two times a day (BID) | ORAL | 0 refills | Status: DC

## 2016-10-05 NOTE — Patient Instructions (Signed)
Sinsusitis Viral based on <10 days, no double sickening, lack of severity of symptoms in first 3 days. I am concerned that it is day 6 and she has not started improving though  Educated on signs that bacterial infection may have developed (symptoms over 10 days, continuing to worsen at this point, fever). She will start augmentin if these happen.   Treatment: -considered steroid: we opted out due to intoleranc -other symptomatic care with coricidin -Antibiotic indicated: will have augmentin on hand to take for 7 days if not improving  Finally, we reviewed reasons to return to care including if symptoms worsen or persist or new concerns arise (particularly fever or shortness of breath)  Meds ordered this encounter  Medications  . amoxicillin-clavulanate (AUGMENTIN) 875-125 MG tablet    Sig: Take 1 tablet by mouth 2 (two) times daily.    Dispense:  14 tablet    Refill:  0

## 2016-10-05 NOTE — Progress Notes (Signed)
Pre visit review using our clinic review tool, if applicable. No additional management support is needed unless otherwise documented below in the visit note. 

## 2016-10-05 NOTE — Progress Notes (Signed)
PCP: Garret Reddish, MD  Subjective:  DEANNDRA COOPRIDER is a 71 y.o. year old very pleasant female patient who presents with sinusitis symptoms including nasal congestion, sinus tenderness -other symptoms include: cough, clear to yellow nasal congestion -day of illness:6 -Symptoms were worsening but finally stabilized today -previous treatments: mucinex Dm- switched to coricidin in last 24 hours -sick contacts/travel/risks: denies flu exposure.   ROS-denies fever, SOB, NVD. Does have dental/tooth pain  Pertinent Past Medical History-  Patient Active Problem List   Diagnosis Date Noted  . Insomnia 06/23/2014    Priority: Medium  . Essential hypertension, benign 03/03/2014    Priority: Medium  . Hyperlipidemia 12/11/2011    Priority: Medium  . Osteopenia     Priority: Medium  . Hypothyroid     Priority: Medium  . Depression 04/16/2007    Priority: Medium  . History of adenomatous polyp of colon 05/20/2015    Priority: Low  . Hair loss 05/20/2015    Priority: Low  . HA (headache)     Priority: Low  . Melanoma     Priority: Low  . 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 ALPRAZolam (XANAX) 0.5 MG tablet TAKE 1/2 TABLET TWICE DAILY AS NEEDED. 30 tablet 1  . Biotin 1000 MCG tablet Take 1,000 mcg by mouth daily.    Marland Kitchen levothyroxine (SYNTHROID, LEVOTHROID) 88 MCG tablet Take 1 tablet (88 mcg total) by mouth daily. 90 tablet 3  . lisinopril (PRINIVIL,ZESTRIL) 40 MG tablet TAKE 1 TABLET EACH DAY. 90 tablet 3  . rosuvastatin (CRESTOR) 10 MG tablet Take 1 tablet (10 mg total) by mouth daily. 90 tablet 3  . sertraline (ZOLOFT) 100 MG tablet Take 1 tablet (100 mg total) by mouth daily. 90 tablet 3  . zolpidem (AMBIEN) 5 MG tablet TAKE 1/2 TO 1 TABLET AT BEDTIME AS NEEDED. 30 tablet 1   No current facility-administered medications for this visit.     Objective: BP (!) 150/78 (BP Location: Left Arm,  Patient Position: Sitting, Cuff Size: Normal)   Pulse 66   Temp 98.1 F (36.7 C) (Oral)   Ht 5' 2.5" (1.588 m)   Wt 135 lb (61.2 kg)   SpO2 96%   BMI 24.30 kg/m  Gen: NAD, resting comfortably HEENT: Turbinates erythematous with clear and yellow drainage, TM normal, pharynx mildly erythematous with no tonsilar exudate or edema, maxillary sinus tenderness CV: RRR no murmurs rubs or gallops Lungs: CTAB no crackles, wheeze, rhonchi  Ext: no edema Skin: warm, dry, no rash  Assessment/Plan:  Sinsusitis Viral based on <10 days, no double sickening, lack of severity of symptoms in first 3 days. I am concerned that it is day 6 and she has not started improving though  Educated on signs that bacterial infection may have developed (symptoms over 10 days, continuing to worsen at this point, fever). She will start augmentin if these happen.   Treatment: -considered steroid: we opted out due to intoleranc -other symptomatic care with coricidin -Antibiotic indicated: will have augmentin on hand to take for 7 days if not improving  Finally, we reviewed reasons to return to care including if symptoms worsen or persist or new concerns arise (particularly fever or shortness of breath)  Meds ordered this encounter  Medications  . amoxicillin-clavulanate (AUGMENTIN) 875-125 MG tablet    Sig: Take 1 tablet by mouth 2 (two) times daily.    Dispense:  14 tablet  Refill:  0   Garret Reddish, MD

## 2016-11-16 ENCOUNTER — Encounter: Payer: Self-pay | Admitting: Internal Medicine

## 2016-11-16 ENCOUNTER — Other Ambulatory Visit: Payer: Self-pay | Admitting: Family Medicine

## 2017-01-03 ENCOUNTER — Ambulatory Visit (AMBULATORY_SURGERY_CENTER): Payer: Self-pay

## 2017-01-03 ENCOUNTER — Encounter: Payer: Self-pay | Admitting: Internal Medicine

## 2017-01-03 VITALS — Ht 62.0 in | Wt 137.2 lb

## 2017-01-03 DIAGNOSIS — Z86010 Personal history of colonic polyps: Secondary | ICD-10-CM

## 2017-01-03 MED ORDER — NA SULFATE-K SULFATE-MG SULF 17.5-3.13-1.6 GM/177ML PO SOLN: 1.0000 | Freq: Once | ORAL | 0 refills | Status: AC

## 2017-01-03 NOTE — Progress Notes (Signed)
Denies allergies to eggs or soy products. Denies complication of anesthesia or sedation. Denies use of weight loss medication. Denies use of O2.   Emmi instructions given for colonoscopy.  

## 2017-01-04 ENCOUNTER — Telehealth: Payer: Self-pay | Admitting: Internal Medicine

## 2017-01-04 LAB — AMB EXT CREATININE: Creatinine, External: 0.95

## 2017-01-04 LAB — AMB EXT LDL-C: LDL-C, External: 136

## 2017-01-04 LAB — AMB EXT HGBA1C: Hemoglobin A1c, External: 7.9

## 2017-01-04 NOTE — Telephone Encounter (Signed)
Patient was called to inform that a pay no more than 50 dollar coupon was called into [facility]. Patient agreed to pick up prep.   Riki Sheer, LPN

## 2017-01-04 NOTE — Telephone Encounter (Signed)
Patient was called to inform that a pay no more than 50 dollar coupon was called into [facility]. Patient agreed to pick up prep.   Riki Sheer, LPN

## 2017-01-17 ENCOUNTER — Ambulatory Visit (AMBULATORY_SURGERY_CENTER): Payer: Medicare Other | Admitting: Internal Medicine

## 2017-01-17 ENCOUNTER — Encounter: Payer: Self-pay | Admitting: Internal Medicine

## 2017-01-17 VITALS — BP 128/71 | HR 66 | Temp 98.0°F | Resp 13 | Ht 62.0 in | Wt 137.0 lb

## 2017-01-17 DIAGNOSIS — Z860100 Personal history of colon polyps, unspecified: Secondary | ICD-10-CM

## 2017-01-17 DIAGNOSIS — I10 Essential (primary) hypertension: Secondary | ICD-10-CM | POA: Diagnosis not present

## 2017-01-17 MED ORDER — SODIUM CHLORIDE 0.9 % IV SOLN: 500.0000 mL | INTRAVENOUS | Status: DC

## 2017-01-17 NOTE — Op Note (Signed)
[place] Patient Name: Caroline Peters Procedure Date: 01/17/2017 8:30 AM MRN: [id number] Endoscopist: Jerene Bears , MD Age: 71 Referring MD:  Date of Birth: 23-Jun-1946 Gender: Female Account #: [id number] Procedure:                Colonoscopy Indications:              High risk colon cancer surveillance: Personal                            history of colonic polyps, Last colonoscopy:                            January 2017; sessile serrated adenoma at the                            appendiceal orifice now s/p extended appendectomy 1                            yr ago Medicines:                Monitored Anesthesia Care Procedure:                Pre-Anesthesia Assessment:                           - Prior to the procedure, a History and Physical                            was performed, and patient medications and                            allergies were reviewed. The patient's tolerance of                            previous anesthesia was also reviewed. The risks                            and benefits of the procedure and the sedation                            options and risks were discussed with the patient.                            All questions were answered, and informed consent                            was obtained. Prior Anticoagulants: The patient has                            taken no previous anticoagulant or antiplatelet                            agents. ASA Grade Assessment: II - A patient with  mild systemic disease. After reviewing the risks                            and benefits, the patient was deemed in                            satisfactory condition to undergo the procedure.                           After obtaining informed consent, the colonoscope                            was passed under direct vision. Throughout the                            procedure, the patient's blood pressure, pulse, and            oxygen saturations were monitored continuously. The                            Colonoscope was introduced through the anus and                            advanced to the the terminal ileum. The colonoscopy                            was performed without difficulty. The patient                            tolerated the procedure well. The quality of the                            bowel preparation was good. The terminal ileum,                            ileocecal valve, appendiceal orifice, and rectum                            were photographed. Scope In: 8:39:44 AM Scope Out: 8:51:06 AM Scope Withdrawal Time: 0 hours 8 minutes 41 seconds  Total Procedure Duration: 0 hours 11 minutes 22 seconds  Findings:                 The perianal and digital rectal examinations were                            normal.                           The terminal ileum appeared normal.                           There was evidence of a prior surgical intervention                            in the cecum .  This was characterized by healthy                            appearing mucosa without evidence of residual polyp.                           A few small-mouthed diverticula were found in the                            sigmoid colon and descending colon.                           Internal hemorrhoids were found during                            retroflexion. The hemorrhoids were small. Complications:            No immediate complications. Estimated Blood Loss:     Estimated blood loss: none. Impression:               - The examined portion of the ileum was normal.                           - Evidence of surgical intervention in the cecum,                            characterized by healthy appearing mucosa without                            residual polyp.                           - Mild diverticulosis in the sigmoid colon and in                            the descending colon.                            - Internal hemorrhoids.                           - No specimens collected. Recommendation:           - Patient has a contact number available for                            emergencies. The signs and symptoms of potential                            delayed complications were discussed with the                            patient. Return to normal activities tomorrow.                            Written discharge instructions were provided to the  patient.                           - Resume previous diet.                           - Continue present medications.                           - Repeat colonoscopy in 5 years for surveillance. Jerene Bears, MD 01/17/2017 8:57:14 AM This report has been signed electronically. CC Letter to:             Odis Hollingshead, MD

## 2017-01-17 NOTE — Progress Notes (Signed)
Pt's states no medical or surgical changes since previsit or office visit. 

## 2017-01-17 NOTE — Patient Instructions (Signed)
YOU HAD AN ENDOSCOPIC PROCEDURE TODAY AT THE [facility]:   Refer to the procedure report that was given to you for any specific questions about what was found during the examination.  If the procedure report does not answer your questions, please call your gastroenterologist to clarify.  If you requested that your care partner not be given the details of your procedure findings, then the procedure report has been included in a sealed envelope for you to review at your convenience later.  YOU SHOULD EXPECT: Some feelings of bloating in the abdomen. Passage of more gas than usual.  Walking can help get rid of the air that was put into your GI tract during the procedure and reduce the bloating. If you had a lower endoscopy (such as a colonoscopy or flexible sigmoidoscopy) you may notice spotting of blood in your stool or on the toilet paper. If you underwent a bowel prep for your procedure, you may not have a normal bowel movement for a few days.  Please Note:  You might notice some irritation and congestion in your nose or some drainage.  This is from the oxygen used during your procedure.  There is no need for concern and it should clear up in a day or so.  SYMPTOMS TO REPORT IMMEDIATELY:   Following lower endoscopy (colonoscopy or flexible sigmoidoscopy):  Excessive amounts of blood in the stool  Significant tenderness or worsening of abdominal pains  Swelling of the abdomen that is new, acute  Fever of 100F or higher  For urgent or emergent issues, a gastroenterologist can be reached at any hour by calling [phone].   DIET:  We do recommend a small meal at first, but then you may proceed to your regular diet.  Drink plenty of fluids but you should avoid alcoholic beverages for 24 hours.  MEDICATIONS: Continue present medications.  Please see handouts given to you by your recovery nurse.  ACTIVITY:  You should plan to take it easy for the rest of today and you should NOT  DRIVE or use heavy machinery until tomorrow (because of the sedation medicines used during the test).    FOLLOW UP: Our staff will call the number listed on your records the next business day following your procedure to check on you and address any questions or concerns that you may have regarding the information given to you following your procedure. If we do not reach you, we will leave a message.  However, if you are feeling well and you are not experiencing any problems, there is no need to return our call.  We will assume that you have returned to your regular daily activities without incident.  If any biopsies were taken you will be contacted by phone or by letter within the next 1-3 weeks.  Please call us at [phone] if you have not heard about the biopsies in 3 weeks.   Thank you for allowing us to provide for your healthcare needs today.   SIGNATURES/CONFIDENTIALITY: You and/or your care partner have signed paperwork which will be entered into your electronic medical record.  These signatures attest to the fact that that the information above on your After Visit Summary has been reviewed and is understood.  Full responsibility of the confidentiality of this discharge information lies with you and/or your care-partner. 

## 2017-01-17 NOTE — Progress Notes (Signed)
Report given to PACU, vss 

## 2017-01-18 ENCOUNTER — Telehealth: Payer: Self-pay | Admitting: *Deleted

## 2017-01-18 NOTE — Telephone Encounter (Signed)
  Follow up Call-  Call back number 01/17/2017 10/03/2015  Post procedure Call Back phone  # [phone]  Permission to leave phone message Yes Yes  Some recent data might be hidden     Patient questions:  Do you have a fever, pain , or abdominal swelling? No. Pain Score  0 *  Have you tolerated food without any problems? Yes.    Have you been able to return to your normal activities? Yes.    Do you have any questions about your discharge instructions: Diet   No. Medications  No. Follow up visit  No.  Do you have questions or concerns about your Care? No.  Actions: * If pain score is 4 or above: No action needed, pain <4.  Pt. Spouse answered phone.  States she is sleeping.  No problems. Will advise her That she was contacted and to call back if any questions or concerns.

## 2017-01-28 ENCOUNTER — Other Ambulatory Visit: Payer: Self-pay | Admitting: Family Medicine

## 2017-02-11 ENCOUNTER — Ambulatory Visit (INDEPENDENT_AMBULATORY_CARE_PROVIDER_SITE_OTHER): Payer: Medicare Other | Admitting: Family Medicine

## 2017-02-11 ENCOUNTER — Encounter: Payer: Self-pay | Admitting: Family Medicine

## 2017-02-11 VITALS — BP 126/72 | HR 67 | Temp 97.8°F | Ht 62.0 in | Wt 136.6 lb

## 2017-02-11 DIAGNOSIS — M79602 Pain in left arm: Secondary | ICD-10-CM | POA: Diagnosis not present

## 2017-02-11 NOTE — Patient Instructions (Signed)
See Dr. Paulla Fore on Wednesday. Sorry I couldn't figure this out today!

## 2017-02-11 NOTE — Progress Notes (Signed)
Subjective:  Caroline Peters is a 71 y.o. year old very pleasant female patient who presents for/with See problem oriented charting ROS- no pain with neck movement, pain with lifting arm overhead at times in certain directions. No paresthesias. No fever or chills.    Past Medical History-  Patient Active Problem List   Diagnosis Date Noted  . Insomnia 06/23/2014    Priority: Medium  . Essential hypertension, benign 03/03/2014    Priority: Medium  . Hyperlipidemia 12/11/2011    Priority: Medium  . Osteopenia     Priority: Medium  . Hypothyroid     Priority: Medium  . Depression 04/16/2007    Priority: Medium  . History of adenomatous polyp of colon 05/20/2015    Priority: Low  . Hair loss 05/20/2015    Priority: Low  . HA (headache)     Priority: Low  . Melanoma     Priority: Low  . 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 ALPRAZolam (XANAX) 0.5 MG tablet TAKE 1/2 TABLET TWICE DAILY AS NEEDED. 30 tablet 1  . Biotin 1000 MCG tablet Take 1,000 mcg by mouth daily.    . calcium carbonate (OSCAL) 1500 (600 Ca) MG TABS tablet Take 1 tablet by mouth daily.    . cholecalciferol (VITAMIN D) 1000 units tablet Take 1,000 Units by mouth daily.    Marland Kitchen levothyroxine (SYNTHROID, LEVOTHROID) 88 MCG tablet Take 1 tablet (88 mcg total) by mouth daily. 90 tablet 3  . lisinopril (PRINIVIL,ZESTRIL) 40 MG tablet TAKE 1 TABLET EACH DAY. 90 tablet 3  . rosuvastatin (CRESTOR) 10 MG tablet TAKE 1 TABLET ONCE DAILY. 90 tablet 1  . sertraline (ZOLOFT) 100 MG tablet Take 1 tablet (100 mg total) by mouth daily. 90 tablet 3  . zolpidem (AMBIEN) 5 MG tablet TAKE 1/2 TO 1 TABLET AT BEDTIME AS NEEDED. 30 tablet 1   No current facility-administered medications for this visit.     Objective: BP 126/72 (BP Location: Left Arm, Patient Position: Sitting, Cuff Size: Normal)   Pulse 67   Temp 97.8 F (36.6 C) (Oral)    Ht 5\' 2"  (1.575 m)   Wt 136 lb 9.6 oz (62 kg)   SpO2 95%   BMI 24.98 kg/m  Gen: NAD, resting comfortably CV: RRR no murmurs rubs or gallops Lungs: CTAB no crackles, wheeze, rhonchi Neuro: negative spurling, intact distal sensatoin  Normal ROM and strength right arm. No pain with palpation of triceps/biceps or shoulder  Left arm/shoulder Pain with palpation along triceps primarily Forward flexion with minimal pain Abduction with moderate pain until about 90 degrees then needs assist for passive motion past that point Pain with hawkins. No pain with empty can.  No redness or swelling over the arm  No pain with resisted flexion of biceps or extension of tricep when muscle isolated.   Assessment/Plan:  Left arm pain - Plan: Ambulatory referral to Sports Medicine S: 6 weeks of left arm pain- in biceps and triceps area. Thought perhaps lifted something (no specific fall or injury though) awkwardly but has not been improving. Mildest of issue on the right arm as well that has resolved. Not worse in the - no stiffness. Significant enough doesn't feel comfortable walking to the table with left arm. At times goes down below elbow but rarely. No numbness or tingling in the arm. 2-3/10 pain but when tries to lift the arm goes up to 10/10.  Nsaid trials have not helped.   No chest pain. No shortness of breath.  No left shoulder pain. Occasionally has pain in left neck. Never had anything like this before.  A/P: atypical pattern- hurts along most of her tricep muscle with palpation but then there is no pain when area is isolated and she extends the forearm. Also some pain with shoulder motion. She is concerned because of her history of melanoma about recurrence. Negative spurling and considering primarily with movement of arm do not think this is cervical in origin.   Appreciate Dr. Nicolasa Ducking help in this matter- I was not able to consistently reproduce potential source.   Orders Placed This  Encounter  Procedures  . Ambulatory referral to Sports Medicine    Referral Priority:   Routine    Referral Type:   Consultation    Referred to Provider:   Gerda Diss, DO    Number of Visits Requested:   1   Return precautions advised.  Garret Reddish, MD

## 2017-02-13 ENCOUNTER — Ambulatory Visit (INDEPENDENT_AMBULATORY_CARE_PROVIDER_SITE_OTHER): Payer: Medicare Other

## 2017-02-13 ENCOUNTER — Ambulatory Visit: Payer: Self-pay

## 2017-02-13 ENCOUNTER — Ambulatory Visit (INDEPENDENT_AMBULATORY_CARE_PROVIDER_SITE_OTHER): Payer: Medicare Other | Admitting: Sports Medicine

## 2017-02-13 ENCOUNTER — Encounter: Payer: Self-pay | Admitting: Sports Medicine

## 2017-02-13 VITALS — BP 122/64 | HR 68 | Ht 62.0 in | Wt 136.8 lb

## 2017-02-13 DIAGNOSIS — R936 Abnormal findings on diagnostic imaging of limbs: Secondary | ICD-10-CM

## 2017-02-13 DIAGNOSIS — M79602 Pain in left arm: Secondary | ICD-10-CM

## 2017-02-13 DIAGNOSIS — M19012 Primary osteoarthritis, left shoulder: Secondary | ICD-10-CM | POA: Diagnosis not present

## 2017-02-13 DIAGNOSIS — M7502 Adhesive capsulitis of left shoulder: Secondary | ICD-10-CM | POA: Insufficient documentation

## 2017-02-13 DIAGNOSIS — M7550 Bursitis of unspecified shoulder: Secondary | ICD-10-CM | POA: Diagnosis not present

## 2017-02-13 NOTE — Assessment & Plan Note (Signed)
The patient did have a small amount of subacromial bursitis as well as signs of early frozen shoulder.  Intra-articular injection performed today.  Therapeutic exercises home exercise program reviewed provided from [facility] to go from AVS.  Recheck in 6 weeks if any lack of improvement consider repeat ultrasound  ++++++++++++++++++++++++++++++++++++++++++++ PROCEDURE NOTE -  ULTRASOUND GUIDEDINJECTION: Left Shoulder Images were obtained and interpreted by myself, Teresa Coombs, DO  Images have been saved and stored to PACS system. Images obtained on: GE S7 Ultrasound machine  ULTRASOUND FINDINGS:  Biceps Tendon: Normal, + halo sign Pec Major Insertion: Normal Subscapularis Tendon: Normal Supraspinatus Tendon: Normal Infraspinatus/Teres Minor Tendon: Normal AC Joint: Normal JOINT: Mild degenerative changes within the [facility] joint and labrum. Mild subacromial  LABRUM: Mild degenerative changes   DESCRIPTION OF PROCEDURE:  The patient's clinical condition is marked by substantial pain and/or significant functional disability. Other conservative therapy has not provided relief, is contraindicated, or not appropriate. There is a reasonable likelihood that injection will significantly improve the patient's pain and/or functional impairment. After discussing the risks, benefits and expected outcomes of the injection and all questions were reviewed and answered, the patient wished to undergo the above named procedure. Verbal consent was obtained. The ultrasound was used to identify the target structure and adjacent neurovascular structures. The skin was then prepped in sterile fashion and the target structure was injected under direct visualization using sterile technique as below: PREP: Alcohol, Ethel Chloride APPROACH: Posterior, sterile stopcock Technique, 21 g 2 " needle INJECTATE: 4cc 1% lidocaine, 2 cc 0.5% marcaine, 2 cc 40mg  DepoMedrol ASPIRATE: N/A DRESSING: Band-Aid   Post procedural  instructions including recommending icing and warning signs for infection were reviewed. This procedure was well tolerated and there were no complications.   IMPRESSION: Succesful US Guided Injection

## 2017-02-13 NOTE — Patient Instructions (Addendum)

## 2017-02-13 NOTE — Progress Notes (Signed)
OFFICE VISIT NOTE Juanda Bond. [place], [place] [facility] [place] - 71 y.o. female MRN [id number]  Date of birth: 1946-04-07  Visit Date: 02/13/2017  PCP: Marin Olp, MD   Referred by: Marin Olp, MD  Burlene Arnt, CMA acting as scribe for Dr. Paulla Fore.  SUBJECTIVE:   Chief Complaint  Patient presents with  . left arm pain   HPI: As below and per problem based documentation when appropriate.  Pt presents today with complaint of left arm pain, bicep and tricep. Tricep is tender to palpation.  Pain started about 6 weeks ago.  No known injury or trauma. She woke up one morning and just left pain in her left. Pt denies SOB, chest pains, increased sweating.   The pain is described as intermittent aching and is rated as 10/10 initially but then goes down to about 2/10.  Worsened with raising the arm toward the ceiling and reaching across her body. She also has pain when trying to lift objects. She has pain when holding the arm parallel to the ground and when reaching back. She says that she sharp pain only lasts about 20 seconds and then subsides.  Pt has tried Aleve and gotten little relief.  She has not tried icing the shoulder or using heat.   Other associated symptoms include:  At times, but rarely, the pain radiates into the elbow and upper back.   Pt denies fever, chills, night sweats, unintentional weight gain or loss.      Review of Systems  Constitutional: Negative for chills and fever.  Respiratory: Positive for cough (worse at night). Negative for shortness of breath and wheezing.   Cardiovascular: Negative for chest pain, palpitations and leg swelling.  Musculoskeletal: Positive for myalgias. Negative for falls.  Neurological: Negative for dizziness, tingling and headaches.  Endo/Heme/Allergies: Does not bruise/bleed easily.    Otherwise per HPI.  HISTORY & PERTINENT PRIOR DATA:  No  specialty comments available. She reports that she has never smoked. She has never used smokeless tobacco. No results for input(s): HGBA1C, LABURIC in the last 8760 hours. Medications & Allergies reviewed per EMR Patient Active Problem List   Diagnosis Date Noted  . Abnormal finding on diagnostic imaging of extremity 02/20/2017  . Adhesive capsulitis of left shoulder 02/13/2017  . 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 History of adenomatous polyp of colon 05/20/2015  . Hair loss 05/20/2015  . Insomnia 06/23/2014  . HA (headache)   . Essential hypertension, benign 03/03/2014  . Melanoma   . Hyperlipidemia 12/11/2011  . Osteopenia   . Hypothyroid   . Depression 04/16/2007   Past Medical History:  Diagnosis Date  . Anxiety   . Cancer ([place])    Melanoma- on arm age 55   . Chronic kidney disease    kidney stone age 71  . DEPRESSION 04/16/2007  . Fibroadenoma of breast   . HA (headache)    hx of   . Hyperlipidemia   . Hypertension   . Hypothyroid   . HYPOTHYROIDISM 04/16/2007  . Osteopenia 06/2013   T score -2.0 FRAX 8.8%/0.8% stable from prior DEXA 2012   Family History  Problem Relation Age of Onset  . Diabetes Mother   . Heart disease Mother 42  . Dementia Mother   . Diabetes Father   . Heart disease Father 14  . Cancer Father        Skin cancer-led to death  .  Breast cancer Maternal Grandmother        Age 52  . Colon cancer Other        first cousin dx'd in his 75's  . Stomach cancer Neg Hx   . Colon polyps Neg Hx   . Esophageal cancer Neg Hx   . Rectal cancer Neg Hx    Past Surgical History:  Procedure Laterality Date  . BREAST SURGERY     bx left breast- and removal of fibroadenoma  . COLONOSCOPY    . EXCISION OF MELANOMA  1978  . HYSTEROSCOPY  1988   HYSTEROSCOPY,D&C  . LAPAROSCOPIC APPENDECTOMY N/A 11/10/2015   Procedure: APPENDECTOMY LAPAROSCOPIC W PARTIAL CECECTOMY ;  Surgeon: Jackolyn Confer, MD;  Location: [facility];  Service: General;  Laterality: N/A;  .  LAPAROSCOPIC ILEOCECECTOMY N/A 11/10/2015   Procedure: LAPAROSCOPIC ILEOCECECTOMY;  Surgeon: Jackolyn Confer, MD;  Location: [facility];  Service: General;  Laterality: N/A;  . MASS EXCISION Right 02/23/2014   Procedure: BIOPSY TEMPORAL ARTERY RIGHT;  Surgeon: Rozetta Nunnery, MD;  Location: [place];  Service: ENT;  Laterality: Right;  . POLYPECTOMY     Social History   Occupational History  . Not on file.   Social History Main Topics  . Smoking status: Never Smoker  . Smokeless tobacco: Never Used  . Alcohol use 8.4 oz/week    14 Standard drinks or equivalent per week     Comment: wine daily 14 glasses per week  . Drug use: No  . Sexual activity: Yes    Birth control/ protection: Post-menopausal    OBJECTIVE:  VS:  HT:5\' 2"  (157.5 cm)   WT:136 lb 12.8 oz (62.1 kg)  BMI:25.1    BP:122/64  HR:68bpm  TEMP: ( )  RESP:95 % EXAM: Findings:  WDWN, NAD, Non-toxic appearing Alert & appropriately interactive Not depressed or anxious appearing No increased work of breathing. Pupils are equal. EOM intact without nystagmus No clubbing or cyanosis of the extremities appreciated No significant rashes/lesions/ulcerations overlying the examined area. Radial pulses 2+/4.  No significant generalized UE edema. Sensation intact to light touch in upper extremities.  Left Shoulder Exam: Normal alignment, Normal Contours.  She is guarding her shoulder significantly. No overlying erythema/ecchymosis. No pain or crepitation with axial loading and circumduction TTP over: Anterior and lateral deltoid but this is minimal. No TTP over: No focal bony tenderness. Internal Rotation: At 30 of abduction and she has 35 of IR and 20 and ER Empty can: Patient minimal 5+/5 strength Hawkins: NormalNormal Neers: Abnormal-terminal only due to limitations in range of motion. Speeds:Normal O'Brien's: Normal      Dg Shoulder Left  Result Date: 02/13/2017 CLINICAL DATA:  Left  shoulder pain radiating to the elbow. No known injury. EXAM: LEFT SHOULDER - 2+ VIEW COMPARISON:  Chest x-ray dated September 11, 2007 which included a portion of the left shoulder. FINDINGS: The bones are subjectively mildly osteopenic. There is a permeative pattern of the proximal humerus as well as the glenoid. No acute fracture or dislocation is observed. The glenohumeral joint space is well maintained. The subacromial subdeltoid space appears normal. There is mild degenerative change of the [facility] joint. IMPRESSION: There is no acute bony abnormality of the left shoulder. Mild degenerative change of the glenohumeral joint is noted. There is also noted be a permeative pattern of mineralization of the proximal humerus and of the glenoid. This is new since a previous study. Correlation with the patient's clinical and laboratory values is needed. Is  there a history of myeloma? Electronically Signed   By: David  Martinique M.D.   On: 02/13/2017 12:25   ASSESSMENT & PLAN:   Problem List Items Addressed This Visit    Adhesive capsulitis of left shoulder    The patient did have a small amount of subacromial bursitis as well as signs of early frozen shoulder.  Intra-articular injection performed today.  Therapeutic exercises home exercise program reviewed provided from HEP to go from AVS.  Recheck in 6 weeks if any lack of improvement consider repeat ultrasound  ++++++++++++++++++++++++++++++++++++++++++++ PROCEDURE NOTE -  ULTRASOUND GUIDEDINJECTION: Left Shoulder Images were obtained and interpreted by myself, Teresa Coombs, DO  Images have been saved and stored to PACS system. Images obtained on: GE S7 Ultrasound machine  ULTRASOUND FINDINGS:  Biceps Tendon: Normal, + halo sign Pec Major Insertion: Normal Subscapularis Tendon: Normal Supraspinatus Tendon: Normal Infraspinatus/Teres Minor Tendon: Normal AC Joint: Normal JOINT: Mild degenerative changes within the [facility] joint and labrum. Mild subacromial    LABRUM: Mild degenerative changes   DESCRIPTION OF PROCEDURE:  The patient's clinical condition is marked by substantial pain and/or significant functional disability. Other conservative therapy has not provided relief, is contraindicated, or not appropriate. There is a reasonable likelihood that injection will significantly improve the patient's pain and/or functional impairment. After discussing the risks, benefits and expected outcomes of the injection and all questions were reviewed and answered, the patient wished to undergo the above named procedure. Verbal consent was obtained. The ultrasound was used to identify the target structure and adjacent neurovascular structures. The skin was then prepped in sterile fashion and the target structure was injected under direct visualization using sterile technique as below: PREP: Alcohol, Ethel Chloride APPROACH: Posterior, sterile stopcock Technique, 21 g 2 " needle INJECTATE: 4cc 1% lidocaine, 2 cc 0.5% marcaine, 2 cc 40mg  DepoMedrol ASPIRATE: N/A DRESSING: Band-Aid   Post procedural instructions including recommending icing and warning signs for infection were reviewed. This procedure was well tolerated and there were no complications.   IMPRESSION: Succesful US Guided Injection          Abnormal finding on diagnostic imaging of extremity    See telephone note following this encounter.       Other Visit Diagnoses    Left arm pain    -  Primary   Relevant Orders   [place] LIMITED JOINT SPACE STRUCTURES UP LEFT(NO LINKED CHARGES)   DG Shoulder Left (Completed)   Subacromial bursitis          Follow-up: Return in about 6 weeks (around 03/27/2017).   CMA/ATC served as Education administrator during this visit. History, Physical, and Plan performed by medical provider. Documentation and orders reviewed and attested to.      Teresa Coombs, [place] Sports Medicine Physician

## 2017-02-18 ENCOUNTER — Other Ambulatory Visit: Payer: Self-pay | Admitting: Family Medicine

## 2017-02-19 DIAGNOSIS — Z803 Family history of malignant neoplasm of breast: Secondary | ICD-10-CM | POA: Diagnosis not present

## 2017-02-19 DIAGNOSIS — Z1231 Encounter for screening mammogram for malignant neoplasm of breast: Secondary | ICD-10-CM | POA: Diagnosis not present

## 2017-02-19 LAB — HM MAMMOGRAPHY

## 2017-02-20 DIAGNOSIS — R936 Abnormal findings on diagnostic imaging of limbs: Secondary | ICD-10-CM | POA: Insufficient documentation

## 2017-02-20 NOTE — Assessment & Plan Note (Signed)
See telephone note following this encounter.

## 2017-02-25 ENCOUNTER — Encounter: Payer: Self-pay | Admitting: Family Medicine

## 2017-02-27 ENCOUNTER — Other Ambulatory Visit: Payer: Self-pay | Admitting: Family Medicine

## 2017-02-27 NOTE — Telephone Encounter (Signed)
Pt last OV was 02/11/17 and last Labs 07/24/16. Rx was last filled on 11/16/16 #30 with 1 refill, please Advise if ok to refill.

## 2017-02-27 NOTE — Telephone Encounter (Signed)
Yes thanks, may fill 

## 2017-03-27 ENCOUNTER — Ambulatory Visit (INDEPENDENT_AMBULATORY_CARE_PROVIDER_SITE_OTHER): Payer: Medicare Other | Admitting: Sports Medicine

## 2017-03-27 ENCOUNTER — Encounter: Payer: Self-pay | Admitting: Sports Medicine

## 2017-03-27 VITALS — BP 100/60 | HR 75 | Ht 62.0 in | Wt 137.6 lb

## 2017-03-27 DIAGNOSIS — M79602 Pain in left arm: Secondary | ICD-10-CM

## 2017-03-27 DIAGNOSIS — M7550 Bursitis of unspecified shoulder: Secondary | ICD-10-CM

## 2017-03-27 DIAGNOSIS — M7502 Adhesive capsulitis of left shoulder: Secondary | ICD-10-CM

## 2017-03-27 DIAGNOSIS — M67431 Ganglion, right wrist: Secondary | ICD-10-CM | POA: Insufficient documentation

## 2017-03-27 NOTE — Assessment & Plan Note (Signed)
Left shoulder pain discomfort and limited range of motion has completely resolved.  She should continue gentle range of motion but we will plan to have her follow-up only as needed.

## 2017-03-27 NOTE — Assessment & Plan Note (Signed)
Small ulnar-sided flexor tendon ganglion cyst that is only mildly tender.  Recommend avoidance of exacerbating activities and topical Aspercreme if needed.  If any worsening she will follow-up for further evaluation.

## 2017-03-27 NOTE — Progress Notes (Signed)
OFFICE VISIT NOTE Juanda Bond. Rebekah [place] Sports Medicine Physician  [place] [facility] [place] - 71 y.o. female MRN [id number]  Date of birth: 06/18/1946  Visit Date: 03/27/2017  PCP: Marin Olp, MD   Referred by: Marin Olp, MD  Jari Sportsman, cma acting as scribe for Dr. Paulla Fore.  SUBJECTIVE:   Chief Complaint  Patient presents with  . Adhesive Capsulitis Left Shoulder Subacromial Bursitis   HPI: As below and per problem based documentation when appropriate.   Caroline Peters reports improvement since steroid injection in her left shoulder on 02-14-2017. She is feeling 100 percent better. No reported problems at this time.     ROS  Otherwise per HPI.  HISTORY & PERTINENT PRIOR DATA:  No specialty comments available. She reports that she has never smoked. She has never used smokeless tobacco. No results for input(s): HGBA1C, LABURIC in the last 8760 hours. Medications & Allergies reviewed per EMR Patient Active Problem List   Diagnosis Date Noted  . Ganglion of right wrist 03/27/2017  . Abnormal finding on diagnostic imaging of extremity 02/20/2017  . Adhesive capsulitis of left shoulder 02/13/2017  . 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 History of adenomatous polyp of colon 05/20/2015  . Hair loss 05/20/2015  . Insomnia 06/23/2014  . HA (headache)   . Essential hypertension, benign 03/03/2014  . Melanoma   . Hyperlipidemia 12/11/2011  . Osteopenia   . Hypothyroid   . Depression 04/16/2007   Past Medical History:  Diagnosis Date  . Anxiety   . Cancer ([place])    Melanoma- on arm age 28   . Chronic kidney disease    kidney stone age 60  . DEPRESSION 04/16/2007  . Fibroadenoma of breast   . HA (headache)    hx of   . Hyperlipidemia   . Hypertension   . Hypothyroid   . HYPOTHYROIDISM 04/16/2007  . Osteopenia 06/2013   T score -2.0 FRAX 8.8%/0.8% stable from prior DEXA 2012   Family  History  Problem Relation Age of Onset  . Diabetes Mother   . Heart disease Mother 60  . Dementia Mother   . Diabetes Father   . Heart disease Father 81  . Cancer Father        Skin cancer-led to death  . Breast cancer Maternal Grandmother        Age 46  . Colon cancer Other        first cousin dx'd in his 15's  . Stomach cancer Neg Hx   . Colon polyps Neg Hx   . Esophageal cancer Neg Hx   . Rectal cancer Neg Hx    Past Surgical History:  Procedure Laterality Date  . BREAST SURGERY     bx left breast- and removal of fibroadenoma  . COLONOSCOPY    . EXCISION OF MELANOMA  1978  . HYSTEROSCOPY  1988   HYSTEROSCOPY,D&C  . LAPAROSCOPIC APPENDECTOMY N/A 11/10/2015   Procedure: APPENDECTOMY LAPAROSCOPIC W PARTIAL CECECTOMY ;  Surgeon: Jackolyn Confer, MD;  [facility];  Service: General;  Laterality: N/A;  . LAPAROSCOPIC ILEOCECECTOMY N/A 11/10/2015   Procedure: LAPAROSCOPIC ILEOCECECTOMY;  Surgeon: Jackolyn Confer, MD;  [facility];  Service: General;  Laterality: N/A;  . MASS EXCISION Right 02/23/2014   Procedure: BIOPSY TEMPORAL ARTERY RIGHT;  Surgeon: Rozetta Nunnery, MD;  Location: [place];  Service: ENT;  Laterality: Right;  .  POLYPECTOMY     Social History   Occupational History  . Not on file.   Social History Main Topics  . Smoking status: Never Smoker  . Smokeless tobacco: Never Used  . Alcohol use 8.4 oz/week    14 Standard drinks or equivalent per week     Comment: wine daily 14 glasses per week  . Drug use: No  . Sexual activity: Yes    Birth control/ protection: Post-menopausal    OBJECTIVE:  VS:  HT:5\' 2"  (157.5 cm)   WT:137 lb 9.6 oz (62.4 kg)  BMI:25.2    BP:100/60  HR:75bpm  TEMP: ( )  RESP:97 % EXAM: Findings:  Left arm is overall well aligned.  She has remarkable improvement in her overhead range of motion and strength.  She is still lacking approximately 15 of terminal internal rotation and flexion but is  remarkably improved Right wrist is a small ulnar-sided cystic structure that is nontender and slightly mobile.  Consistent with ganglion cyst.     No results found. ASSESSMENT & PLAN:     ICD-10-CM   1. Left arm pain M79.602   2. Adhesive capsulitis of left shoulder M75.02   3. Subacromial bursitis M75.50   4. Ganglion of right wrist M67.431   ================================================================= Ganglion of right wrist Small ulnar-sided flexor tendon ganglion cyst that is only mildly tender.  Recommend avoidance of exacerbating activities and topical Aspercreme if needed.  If any worsening she will follow-up for further evaluation.  Adhesive capsulitis of left shoulder Left shoulder pain discomfort and limited range of motion has completely resolved.  She should continue gentle range of motion but we will plan to have her follow-up only as needed.  There are no Patient Instructions on file for this visit.================================================================= Follow-up: Return if symptoms worsen or fail to improve.   CMA/ATC served as Education administrator during this visit. History, Physical, and Plan performed by medical provider. Documentation and orders reviewed and attested to.      Teresa Coombs, [place] Sports Medicine Physician

## 2017-04-02 DIAGNOSIS — D2361 Other benign neoplasm of skin of right upper limb, including shoulder: Secondary | ICD-10-CM | POA: Diagnosis not present

## 2017-04-02 DIAGNOSIS — L814 Other melanin hyperpigmentation: Secondary | ICD-10-CM | POA: Diagnosis not present

## 2017-04-02 DIAGNOSIS — Z85820 Personal history of malignant melanoma of skin: Secondary | ICD-10-CM | POA: Diagnosis not present

## 2017-04-02 DIAGNOSIS — L820 Inflamed seborrheic keratosis: Secondary | ICD-10-CM | POA: Diagnosis not present

## 2017-05-03 ENCOUNTER — Ambulatory Visit
Admit: 2017-05-03 | Discharge: 2017-05-03 | Payer: PRIVATE HEALTH INSURANCE | Attending: Internal Medicine | Primary: Internal Medicine

## 2017-05-03 DIAGNOSIS — E119 Type 2 diabetes mellitus without complications: Secondary | ICD-10-CM

## 2017-05-03 MED ORDER — METFORMIN 500 MG TAB
500 mg | ORAL_TABLET | Freq: Two times a day (BID) | ORAL | 3 refills | Status: AC
Start: 2017-05-03 — End: ?

## 2017-05-03 NOTE — Progress Notes (Signed)
Chief Complaint   Patient presents with   ??? Establish Care     new diabetes, diabetes         1. Have you been to the ER, urgent care clinic since your last visit?  Hospitalized since your last visit? no    2. Have you seen or consulted any other health care providers outside of the [facility] since your last visit?  Include any pap smears or colon screening.  colonscopy early 2018 normal results

## 2017-05-03 NOTE — Patient Instructions (Signed)
Diabetes.    Watch carbohydrate intake with meals and aim to keep this less than 45 grams per meal.    A Mediterranean style diet with 55% or less of calories from carbohydrates has been shown to be very helpful for people with diabetes. This diet consists of vegetables, whole fruit, nuts, whole grains, beans, lentils, olive oil, fish, chicken, and less refined grains, red and processed meats.     Exercise: work up to 30 minutes 5 days weekly of walking, or any other activity that gets your heart rate up.     Make sure you are getting enough sleep - at least 7 hours/night.     Medications:  Increase metformin to 1000 mg twice daily    Continue Novolog 70/30 - 15 units twice daily    Goals for blood glucose:  Fasting 80-130 (less than 150)  Lunch, Dinner, Bedtime - 80-150 (less than 180).      Will check to assess your insulin production. May add additional medication to decrease insulin needs.

## 2017-05-03 NOTE — Progress Notes (Signed)
Chief Complaint   Patient presents with   ??? Establish Care     new diabetes, diabetes     History of Present Illness: Jamie Park is a 71 y.o. female presents for evaluation of diabetes.  she has had diabetes for since 1989.  Most recent A1c was 7.9 in 01/2017    Diabetes related complications:  none    Current diabetes regimen:  Metformin 500 mg twice daily  Novolog 70/30 - 15 units in AM in PM. Glucoses were very high on 10 units twice daily.     Was doing Novolog 10 units with each meal. Was taking 30 units Lantus daily (total 60 units/day of insulin).  Glucoses were better on this regimen, but she wished to decrease the number of injections.  Additionally, the cost of paying for 2 branded insulins was also too great.    Glucoses:  On higher dose of Novolog 70/30 -Values now around 180 fasting.    Evening: 210 in evening.      Diet:  - Breakfast: did not eat yet today.  May have smoothie. Or boiled egg toast.  - Lunch:  salad  - Dinner: bologna sandwich  She reports making substantial changes in her dietary efforts.  She is eating less candy, avoiding sugared beverages, having less sweets.  She reports losing about 10 pounds over the last 8 months.    HTN: Currently taking amlodipine, benazepril and 12.5 mg of hydrocortisone  Dyslipidemia: She is not currently taking a statin.  She had taken a statin in the past.  She did not have any side effects, but she stopped due to some negative press that she heard.    Social:  She is 1 of 15 children  Currently living alone.  Past Medical History:   Diagnosis Date   ??? Breast cancer (HCC) 1988   ??? Diabetes mellitus (HCC)     1989     Past Surgical History:   Procedure Laterality Date   ??? PR ENDOCRINE SURG THYROID PROC UNLISTED       Current Outpatient Prescriptions   Medication Sig   ??? NOVOLOG MIX 70-30FLEXPEN U-100 100 unit/mL (70-30) inpn    ??? NOVOLIN R REGULAR U-100 INSULN 100 unit/mL injection FOR BLOOD SUGAR  175-200 GIVE 4 UNITS, 200-250 GIVE 6 UNITS, 250-3...  (REFER TO PRESCRIPTION NOTES).   ??? metFORMIN (GLUCOPHAGE) 500 mg tablet    ??? BD ULTRA-FINE SHORT PEN NEEDLE 31 gauge x 5/16" ndle    ??? BD INSULIN SYRINGE ULTRA-FINE 0.5 mL 31 gauge x 5/16 syrg    ??? hydroCHLOROthiazide (HYDRODIURIL) 12.5 mg tablet    ??? fluticasone (FLONASE) 50 mcg/actuation nasal spray    ??? TRUE METRIX GLUCOSE TEST STRIP strip    ??? benazepril (LOTENSIN) 40 mg tablet take 1 tablet by mouth once daily   ??? amLODIPine (NORVASC) 5 mg tablet take 1 tablet by mouth once daily     No current facility-administered medications for this visit.      Not on File  Family History   Problem Relation Age of Onset   ??? Heart Failure Mother      CHF     Social History     Social History   ??? Marital status: MARRIED     Spouse name: N/A   ??? Number of children: N/A   ??? Years of education: N/A     Occupational History   ??? Not on file.     Social History Main Topics   ???  Smoking status: Former Smoker     Quit date: 05/03/1997   ??? Smokeless tobacco: Never Used   ??? Alcohol use No   ??? Drug use: No   ??? Sexual activity: Not Currently     Other Topics Concern   ??? Not on file     Social History Narrative   ??? No narrative on file     Review of Systems:  - Constitutional Symptoms: no fevers, chills, see HPI  - Eyes: no blurry vision or double vision  - Cardiovascular: no chest pain or palpitations  - Respiratory: no cough or shortness of breath  - Gastrointestinal: no dysphagia or abdominal pain  - Musculoskeletal: no joint pains or weakness  - Integumentary: no rashes  - Neurological: no numbness, tingling, or headaches  - Psychiatric: no depression or anxiety  - Endocrine: no heat or cold intolerance, no polyuria or polydipsia    Physical Examination:  Visit Vitals   ??? BP 147/63 (BP 1 Location: Left arm, BP Patient Position: Sitting)   ??? Pulse (!) 58   ??? Ht 5\' 4"  (1.626 m)   ??? Wt 188 lb 9.6 oz (85.5 kg)   ??? SpO2 100%   ??? BMI 32.37 kg/m2   -   - General: pleasant, no distress,    - HEENT:No scleral/conjunctival injection, EOMI,MMM  - Cardiovascular: regular, normal rate  - Respiratory: normal effort  - Integumentary: no edema  - Neurological: alert and oriented  - Psychiatric: normal mood and affect    Data Reviewed:   A1c 7.9  Cholesterol 219, triglycerides 180, HDL 47, LDL 136  Normal CMP  TSH 8.89    Assessment/Plan:   1. Diabetes mellitus, type II, insulin dependent (HCC)   Although she has had diabetes for many years and has been taking insulin the bulk of the time, I suspect she would do well on a GLP-1 agonist and basal insulin (would consider generic NPH insulin due to cost)  Will check C-peptide level to assess endogenous insulin production.  Increase metformin to 1000 mg twice daily  Now, continue NovoLog 70/30 15 units twice daily  Encouraged continued dietary efforts  .Reviewed pathology of type II diabetes, including insulin resistance and progressive loss of beta cell function and insulin production with time.  Explained the role of weight loss and limiting carbohydrate intake in affecting insulin needs.  Reviewed basal and prandial insulin needs.  Reviewed handout and gave basic directions on carbohydrate content of foods.  Recommended limiting carbohydrate intake to < 45 g with meals.  Encouraged exercise - 30 min 5 x weekly.       2. Elevated TSH   Reassess.  Check free T4 and TPO antibodies as well.  Reviewed indications for treatment   3. HTN (hypertension), benign   Continue current treatment regimen.  Encourage weight loss   4. Dyslipidemia   Encouraged weight loss  The rationale for statin therapy.  Discussed the CV reduction in patients with diabetes with milligrams of atorvastatin.  She seemed agreeable to considering this.  Will reassess at next visit, ideally when hypothyroidism is adequately treated, then may consider starting statin   5. BMI 32.0-32.9,adult   Encouraged continued efforts with diet and exercise   Reviewed much strategy to increase metformin and add weight currently medications in order to decrease insulin needs and aid with weight loss     Patient Instructions   Diabetes.    Watch carbohydrate intake with meals and aim to keep this less  than 45 grams per meal.    A Mediterranean style diet with 55% or less of calories from carbohydrates has been shown to be very helpful for people with diabetes. This diet consists of vegetables, whole fruit, nuts, whole grains, beans, lentils, olive oil, fish, chicken, and less refined grains, red and processed meats.     Exercise: work up to 30 minutes 5 days weekly of walking, or any other activity that gets your heart rate up.     Make sure you are getting enough sleep - at least 7 hours/night.     Medications:  Increase metformin to 1000 mg twice daily    Continue Novolog 70/30 - 15 units twice daily    Goals for blood glucose:  Fasting 80-130 (less than 150)  Lunch, Dinner, Bedtime - 80-150 (less than 180).      Will check to assess your insulin production. May add additional medication to decrease insulin needs.         Follow-up Disposition:  Return in about 3 months (around 08/02/2017).

## 2017-05-04 LAB — C-PEPTIDE
C-Peptide: 1.7 ng/mL (ref 1.1–4.4)
C-Peptide: 1.7 ng/mL (ref 1.1–4.4)

## 2017-05-04 LAB — METABOLIC PANEL, BASIC
BUN/Creatinine ratio: 10 — ABNORMAL LOW (ref 12–28)
BUN: 10 mg/dL (ref 8–27)
CO2: 25 mmol/L (ref 20–29)
Calcium: 9.8 mg/dL (ref 8.7–10.3)
Chloride: 106 mmol/L (ref 96–106)
Creatinine: 1.04 mg/dL — ABNORMAL HIGH (ref 0.57–1.00)
GFR est AA: 62 mL/min/{1.73_m2} (ref >59)
GFR est non-AA: 54 mL/min/{1.73_m2} — ABNORMAL LOW (ref >59)
Glucose: 89 mg/dL (ref 65–99)
Potassium: 4.1 mmol/L (ref 3.5–5.2)
Sodium: 143 mmol/L (ref 134–144)

## 2017-05-04 LAB — HEMOGLOBIN A1C WITH EAG
Estimated average glucose: 177 mg/dL
Hemoglobin A1c: 7.8 % — ABNORMAL HIGH (ref 4.8–5.6)

## 2017-05-04 LAB — THYROID PEROXIDASE (TPO) AB: Thyroid peroxidase Ab: 11 IU/mL (ref 0–34)

## 2017-05-04 LAB — T4, FREE: T4, Free: 1.15 ng/dL (ref 0.82–1.77)

## 2017-05-04 LAB — TSH 3RD GENERATION: TSH: 9.94 u[IU]/mL — ABNORMAL HIGH (ref 0.450–4.500)

## 2017-05-13 ENCOUNTER — Other Ambulatory Visit: Payer: Self-pay | Admitting: Family Medicine

## 2017-06-01 ENCOUNTER — Other Ambulatory Visit: Payer: Self-pay | Admitting: Family Medicine

## 2017-06-10 NOTE — Telephone Encounter (Signed)
MEDICATION:   levothyroxine (SYNTHROID, LEVOTHROID) 88 MCG tablet     PHARMACY:  [place], [place] [phone] (Phone) [phone] (Fax)      IS THIS A 90 DAY SUPPLY : yes  IS PATIENT OUT OF MEDICATION: yes  IF NOT; HOW MUCH IS LEFT: n/a  LAST APPOINTMENT DATE: @6 /11/18  NEXT APPOINTMENT DATE:@Visit  date not found  OTHER COMMENTS:    **Let patient know to contact pharmacy at the end of the day to make sure medication is ready. **  ** Please notify patient to allow 48-72 hours to process**  **Encourage patient to contact the pharmacy for refills or they can request refills through [facility]**

## 2017-07-04 ENCOUNTER — Other Ambulatory Visit: Payer: Self-pay

## 2017-07-04 ENCOUNTER — Telehealth: Payer: Self-pay | Admitting: Family Medicine

## 2017-07-04 MED ORDER — ALPRAZOLAM 0.5 MG PO TABS: ORAL_TABLET | ORAL | 1 refills | Status: DC

## 2017-07-04 NOTE — Telephone Encounter (Signed)
MEDICATION:   ALPRAZolam Duanne Moron) 0.5 MG tablet     PHARMACY:   [place], [place] [phone] (Fax)     IS THIS A 90 DAY SUPPLY :   IS PATIENT OUT OF MEDICATION:   IF NOT; HOW MUCH IS LEFT:   LAST APPOINTMENT DATE: @6 /11/18 NEXT APPOINTMENT DATE:@Visit  date not found  OTHER COMMENTS:    **Let patient know to contact pharmacy at the end of the day to make sure medication is ready. **  ** Please notify patient to allow 48-72 hours to process**  **Encourage patient to contact the pharmacy for refills or they can request refills through [facility]**

## 2017-07-04 NOTE — Telephone Encounter (Signed)
Prescription printed and faxed to pharmacy 

## 2017-07-04 NOTE — Telephone Encounter (Signed)
Patient called in reference to Rx below. Patient stated as of 3:30 pm 07/04/17, pharmacy stated they did not have prescription listed below. Patient is coming in for flu shot 07/05/17 and stated she would check then to see if Rx has been received by pharmacy.

## 2017-07-05 ENCOUNTER — Ambulatory Visit (INDEPENDENT_AMBULATORY_CARE_PROVIDER_SITE_OTHER): Payer: Medicare Other | Admitting: *Deleted

## 2017-07-05 DIAGNOSIS — Z23 Encounter for immunization: Secondary | ICD-10-CM

## 2017-07-27 ENCOUNTER — Other Ambulatory Visit: Payer: Self-pay | Admitting: Family Medicine

## 2017-08-02 ENCOUNTER — Ambulatory Visit
Admit: 2017-08-02 | Discharge: 2017-08-02 | Payer: PRIVATE HEALTH INSURANCE | Attending: Internal Medicine | Primary: Internal Medicine

## 2017-08-02 DIAGNOSIS — E119 Type 2 diabetes mellitus without complications: Secondary | ICD-10-CM

## 2017-08-02 MED ORDER — DULAGLUTIDE 0.75 MG/0.5 ML SUBCUTANEOUS PEN INJECTOR
0.75 mg/0.5 mL | PEN_INJECTOR | SUBCUTANEOUS | 0 refills | Status: DC
Start: 2017-08-02 — End: 2017-08-29

## 2017-08-02 MED ORDER — INDAPAMIDE 2.5 MG TAB
2.5 mg | ORAL_TABLET | Freq: Every day | ORAL | 10 refills | Status: DC
Start: 2017-08-02 — End: 2018-08-05

## 2017-08-02 MED ORDER — INSULIN NPH HUMAN RECOMB 100 UNIT/ML INJECTION
100 unit/mL | SUBCUTANEOUS | 10 refills | Status: DC
Start: 2017-08-02 — End: 2018-08-20

## 2017-08-02 MED ORDER — HUMULIN N NPH U-100 INSULIN KWIKPEN 100 UNIT/ML (3 ML) SUBCUTANEOUS
100 unit/mL (3 mL) | PEN_INJECTOR | SUBCUTANEOUS | 0 refills | Status: DC
Start: 2017-08-02 — End: 2017-10-31

## 2017-08-02 NOTE — Progress Notes (Signed)
TSH has steadily increased. Will start levothyroxine 75 mcg daily.   Diabetes - we changed diabetes regimen.  Charlene,  Please call and mail lab letter.   Thank you.

## 2017-08-02 NOTE — Progress Notes (Signed)
Jamie BrasCarolyn Park is a 71 y.o. female        Chief Complaint   Patient presents with   ??? Follow-up   ??? Diabetes       1. Have you been to the ER, urgent care clinic since your last visit?  Hospitalized since your last visit?No    2. Have you seen or consulted any other health care providers outside of the [facility] since your last visit?  Include any pap smears or colon screening. No

## 2017-08-02 NOTE — Patient Instructions (Addendum)
Diabetes.    Watch carbohydrate intake with meals and aim to keep this less than 45 grams per meal.    Medications:  Continue metformin to 1000 mg twice daily    NPH - Novolin N - 15 unit twice daily.    - We will adjust dose based on glucoses.  PM dose - follow trend from bedtime to fasting to adjust  AM dose - follow trend from morning to dinner to adjust dos  ** We should lower dose for values <90      Add Trulicity 0.75 mg once weekly. This should help with control with meals    Goals for blood glucose:  Fasting 80-130 (less than 150)  Lunch, Dinner, Bedtime - 80-150 (less than 180).    Blood pressure:  Start indapamide 2.5 mg daily   Stop HCTZ  Continue amlodipine 5 mg + benazepril

## 2017-08-02 NOTE — Progress Notes (Addendum)
History of Present Illness: Jamie Park is a 71 y.o. female presents for follow-up of diabetes.  she has had diabetes for since 1989.  Most recent A1c was 7.8 in 04/2017    Diabetes related complications:  none    Current diabetes regimen:  Metformin 500 mg twice daily  Novolog 70/30 - 15 units in AM in PM.    As noted at last visit and as she reminded of today, she was previously taking Novolog 10 units with each meal + 30 units Lantus daily (total 60 units/day of insulin) and glucoses were much better on this regimen, but it was not affordable for her.    Glucoses:  Reports glucoses are frequently around 200.  She is very discouraged by this.    Diet:  She is being very diligent limiting her carbohydrate intake.    HTN: Currently taking amlodipine, benazepril and 12.5 mg of hydrochlorothiazide.  Blood pressure is elevated.  At home she reports values are around 150 systolic    Dyslipidemia: Prescribed rosuvastatin    Social:  Currently living alone.      Past Medical History:   Diagnosis Date   ??? Breast cancer (HCC) 1988   ??? Diabetes mellitus (HCC)     1989     Current Outpatient Medications   Medication Sig   ??? NOVOLOG MIX 70-30FLEXPEN U-100 100 unit/mL (70-30) inpn    ??? BD ULTRA-FINE SHORT PEN NEEDLE 31 gauge x 5/16" ndle    ??? BD INSULIN SYRINGE ULTRA-FINE 0.5 mL 31 gauge x 5/16 syrg    ??? hydroCHLOROthiazide (HYDRODIURIL) 12.5 mg tablet    ??? fluticasone (FLONASE) 50 mcg/actuation nasal spray as needed.   ??? TRUE METRIX GLUCOSE TEST STRIP strip    ??? benazepril (LOTENSIN) 40 mg tablet take 1 tablet by mouth once daily   ??? amLODIPine (NORVASC) 5 mg tablet take 1 tablet by mouth once daily   ??? metFORMIN (GLUCOPHAGE) 500 mg tablet Take 2 Tabs by mouth two (2) times daily (with meals).     No current facility-administered medications for this visit.      Not on File    Review of Systems:  - Eyes: no blurry vision or double vision  - Cardiovascular: no chest pain  - Respiratory: no shortness of breath   - Musculoskeletal: no myalgias  - Neurological: no numbness/tingling in extremities    Physical Examination:  Visit Vitals  BP 194/73 (BP 1 Location: Right arm, BP Patient Position: Sitting)   Pulse (!) 57   Resp 16   Ht 5\' 4"  (1.626 m)   Wt 188 lb 9.6 oz (85.5 kg)   SpO2 100%   BMI 32.37 kg/m??   -   - General: pleasant, no distress, normal gait   HEENT: hearing intact, EOMI, clear sclera without icterus  - Cardiovascular: regular, normal rate   - Respiratory: normal effort  - Integumentary: no edema  - Psychiatric: normal mood and affect    Data Reviewed:   Component      Latest Ref Rng & Units 05/03/2017 05/03/2017 05/03/2017 05/03/2017           4:02 PM  4:02 PM  4:02 PM  4:02 PM   Glucose      65 - 99 mg/dL       BUN      8 - 27 mg/dL       Creatinine      5.36 - 1.00 mg/dL       GFR  est non-AA      >59 mL/min/1.73       GFR est AA      >59 mL/min/1.73       BUN/Creatinine ratio      12 - 28       Sodium      134 - 144 mmol/L       Potassium      3.5 - 5.2 mmol/L       Chloride      96 - 106 mmol/L       CO2      20 - 29 mmol/L       Calcium      8.7 - 10.3 mg/dL       Hemoglobin Z6XA1c, (calculated)      4.8 - 5.6 % 7.8 (H)      Estimated average glucose      mg/dL 096177      C-Peptide      1.1 - 4.4 ng/mL       TSH      0.450 - 4.500 uIU/mL    9.940 (H)   T4, Free      0.82 - 1.77 ng/dL   0.451.15    Thyroid peroxidase Ab      0 - 34 IU/mL  11       Component      Latest Ref Rng & Units 05/03/2017 05/03/2017           4:02 PM  4:02 PM   Glucose      65 - 99 mg/dL 89    BUN      8 - 27 mg/dL 10    Creatinine      4.090.57 - 1.00 mg/dL 8.111.04 (H)    GFR est non-AA      >59 mL/min/1.73 54 (L)    GFR est AA      >59 mL/min/1.73 62    BUN/Creatinine ratio      12 - 28 10 (L)    Sodium      134 - 144 mmol/L 143    Potassium      3.5 - 5.2 mmol/L 4.1    Chloride      96 - 106 mmol/L 106    CO2      20 - 29 mmol/L 25    Calcium      8.7 - 10.3 mg/dL 9.8    Hemoglobin B1YA1c, (calculated)      4.8 - 5.6 %     Estimated average glucose       mg/dL     C-Peptide      1.1 - 4.4 ng/mL  1.7       Assessment/Plan:   1. Type 2 diabetes mellitus without complication, with long-term current use of insulin (HCC)   Not controlled on 70 3015 units twice daily  Reviewed her labs from last visit, with particular attention to the C-peptide level which demonstrated normal insulin production.  Because of this, I think she may respond well to non insulin therapies that decrease insulin needs, such as GLP-1 agonist.  Discussed medication options with her today.  I thought that one option that may work well for her would be pioglitazone and GLP-1 agonist and accommodation with metformin.  She preferred not to take the pioglitazone due to concerns of bladder cancer  The other option I discussed with her was to use a novel and and [facility]  a month   In combination with Trulicity 0.75 mg weekly.  I think this regimen would work well for her.  Hopefully it will be affordable as well.  Start NPH 15 units twice daily at night and in the morning.  Start Trulicity 0.75 mg once weekly.  First injection was demonstrated and given in the office today  Continue metformin  Advised her to update us with her glucose levels and we can further adjust the NPH insulin.  Advised her to decrease this should she have low values.   2. Acquired hypothyroidism   Reassess.  If value remains close to 10 or higher, we will add levothyroxine 50-75 mcg daily   3. HTN (hypertension), benign   not controlled today  Add indapamide 2.5 mg daily to replace HCTZ  (Thiazide review article:  'Which Thiazide to Choose as Add-On Therapy For Hypertension?' Integrated Blood Pressure Control. July 2014. Pages 35-47.)  Continue amlodipine and benazepril  Reviewed recommendations for goal BP < 130 at home   4. Dyslipidemia   Recommend she take the rosuvastatin    5. BMI 32.0-32.9,adult   Encouraged continued weight loss efforts to improve her weight related  health conditions, particularly diabetes   Greater than 50% of 40 minute visit was spent counseling the patient about above.    Patient Instructions   Diabetes.    Watch carbohydrate intake with meals and aim to keep this less than 45 grams per meal.    Medications:  Continue metformin to 1000 mg twice daily    NPH - Novolin N - 15 unit twice daily.    - We will adjust dose based on glucoses.  PM dose - follow trend from bedtime to fasting to adjust  AM dose - follow trend from morning to dinner to adjust dos  ** We should lower dose for values <90      Add Trulicity 0.75 mg once weekly. This should help with control with meals    Goals for blood glucose:  Fasting 80-130 (less than 150)  Lunch, Dinner, Bedtime - 80-150 (less than 180).    Blood pressure:  Start indapamide 2.5 mg daily   Stop HCTZ  Continue amlodipine 5 mg + benazepril         Follow-up Disposition:  Return in about 3 months (around 10/31/2017).    Copy sent to:

## 2017-08-03 LAB — METABOLIC PANEL, BASIC
BUN/Creatinine ratio: 14 (ref 12–28)
BUN: 13 mg/dL (ref 8–27)
CO2: 25 mmol/L (ref 20–29)
Calcium: 9.8 mg/dL (ref 8.7–10.3)
Chloride: 103 mmol/L (ref 96–106)
Creatinine: 0.94 mg/dL (ref 0.57–1.00)
GFR est AA: 71 mL/min/{1.73_m2} (ref >59)
GFR est non-AA: 61 mL/min/{1.73_m2} (ref >59)
Glucose: 174 mg/dL — ABNORMAL HIGH (ref 65–99)
Potassium: 4.4 mmol/L (ref 3.5–5.2)
Sodium: 141 mmol/L (ref 134–144)

## 2017-08-03 LAB — TSH 3RD GENERATION: TSH: 10.46 u[IU]/mL — ABNORMAL HIGH (ref 0.450–4.500)

## 2017-08-03 LAB — HEMOGLOBIN A1C WITH EAG
Estimated average glucose: 223 mg/dL
Hemoglobin A1c: 9.4 % — ABNORMAL HIGH (ref 4.8–5.6)

## 2017-08-03 LAB — T4, FREE: T4, Free: 1.14 ng/dL (ref 0.82–1.77)

## 2017-08-05 MED ORDER — LEVOTHYROXINE 75 MCG TAB
75 mcg | ORAL_TABLET | Freq: Every day | ORAL | 3 refills | Status: AC
Start: 2017-08-05 — End: ?

## 2017-08-05 NOTE — Telephone Encounter (Signed)
Patient was called via mobile phone. x1 ID verified. Pt was made aware of lab results per Dr. Adriana Simasook. Aware lab result letter will be mailed out. No other questions, comments, or concerns voiced at this time.

## 2017-08-05 NOTE — Telephone Encounter (Signed)
-----   Message from Myrna BlazerGregory D Cook, MD sent at 08/05/2017  6:17 AM EST -----  TSH has steadily increased. Will start levothyroxine 75 mcg daily.   Diabetes - we changed diabetes regimen.  Charlene,  Please call and mail lab letter.   Thank you.

## 2017-08-07 ENCOUNTER — Other Ambulatory Visit: Payer: Self-pay | Admitting: Family Medicine

## 2017-08-08 NOTE — Progress Notes (Signed)
Jamie Park, Gregory D, MD  Hendrick-Williams, George InaYolanda L, RN      ??      Abron Neddo,     Please check in on Jamie Park this week to see how she is doing with change in insulin from 70/30 to NPH + ??addition of Trulicity.     We may need to increase or decrease NPH based on how she is doing   (FYI - Westley HummerCharlene may call her with lab letter, but you can review lab letter if they have not yet done so, or if she asks questions about labs)     Thank you   Tammy SoursGreg      Patient Instructions: 08/02/17   Diabetes.  ??  Watch carbohydrate intake with meals and aim to keep this less than 45 grams per meal.  ??  Medications:  Continue metformin to 1000 mg twice daily  ??  NPH - Novolin N - 15 unit twice daily.    - We will adjust dose based on glucoses.  PM dose - follow trend from bedtime to fasting to adjust  AM dose - follow trend from morning to dinner to adjust dos  ** We should lower dose for values <90  ??  ??  Add Trulicity 0.75 mg once weekly. This should help with control with meals  ??  Goals for blood glucose:  Fasting 80-130 (less than 150)  Lunch, Dinner, Bedtime - 80-150 (less than 180).    08/08/17  Attempted to contact patient, follow up for chronic condition of diabetes with blood sugar readings and insulin regimen.  No answer, left voicemail message to return telephone call.

## 2017-08-17 ENCOUNTER — Other Ambulatory Visit: Payer: Self-pay | Admitting: Family Medicine

## 2017-08-19 NOTE — Progress Notes (Signed)
08/19/17  Attempted to contact patient, no answer, left voicemail message to return telephone call.

## 2017-08-28 DIAGNOSIS — H25041 Posterior subcapsular polar age-related cataract, right eye: Secondary | ICD-10-CM | POA: Diagnosis not present

## 2017-08-29 ENCOUNTER — Encounter

## 2017-08-29 MED ORDER — DULAGLUTIDE 0.75 MG/0.5 ML SUBCUTANEOUS PEN INJECTOR
0.75 mg/0.5 mL | PEN_INJECTOR | SUBCUTANEOUS | 6 refills | Status: DC
Start: 2017-08-29 — End: 2017-10-31

## 2017-08-29 NOTE — Telephone Encounter (Signed)
-----   Message from Ashok Palliffany N Robinson sent at 08/29/2017  8:14 AM EST -----  Regarding: Dr. Luellen Puckerook/Telephone  Pt stated she has called twice  is out of the trulicity pen. She is using [facility] on [address]Best contact is [phone].

## 2017-08-29 NOTE — Telephone Encounter (Signed)
08/29/2017  8:20 AM      Please see message below from answering service.        Thanks

## 2017-09-17 LAB — AMB EXT HGBA1C: Hemoglobin A1c, External: 7.7

## 2017-09-17 LAB — AMB EXT CREATININE: Creatinine, External: 0.92

## 2017-09-17 LAB — AMB EXT LDL-C: LDL-C, External: 52

## 2017-10-09 ENCOUNTER — Encounter: Payer: Self-pay | Admitting: Family Medicine

## 2017-10-09 ENCOUNTER — Ambulatory Visit: Payer: Medicare Other | Admitting: Family Medicine

## 2017-10-09 VITALS — BP 134/68 | HR 68 | Temp 98.2°F | Ht 62.0 in | Wt 134.8 lb

## 2017-10-09 DIAGNOSIS — F3341 Major depressive disorder, recurrent, in partial remission: Secondary | ICD-10-CM | POA: Diagnosis not present

## 2017-10-09 DIAGNOSIS — E785 Hyperlipidemia, unspecified: Secondary | ICD-10-CM

## 2017-10-09 DIAGNOSIS — I10 Essential (primary) hypertension: Secondary | ICD-10-CM | POA: Diagnosis not present

## 2017-10-09 DIAGNOSIS — E039 Hypothyroidism, unspecified: Secondary | ICD-10-CM | POA: Diagnosis not present

## 2017-10-09 LAB — CBC
HEMATOCRIT: 37.5 % (ref 36.0–46.0)
Hemoglobin: 12.5 g/dL (ref 12.0–15.0)
MCHC: 33.3 g/dL (ref 30.0–36.0)
MCV: 93.9 fl (ref 78.0–100.0)
Platelets: 190 10*3/uL (ref 150.0–400.0)
RBC: 3.99 Mil/uL (ref 3.87–5.11)
RDW: 13.7 % (ref 11.5–15.5)
WBC: 3.6 10*3/uL — ABNORMAL LOW (ref 4.0–10.5)

## 2017-10-09 LAB — LIPID PANEL
CHOL/HDL RATIO: 3
Cholesterol: 203 mg/dL — ABNORMAL HIGH (ref 0–200)
HDL: 78.6 mg/dL (ref >39.00)
LDL Cholesterol: 110 mg/dL — ABNORMAL HIGH (ref 0–99)
NONHDL: 124.41
TRIGLYCERIDES: 71 mg/dL (ref 0.0–149.0)
VLDL: 14.2 mg/dL (ref 0.0–40.0)

## 2017-10-09 LAB — COMPREHENSIVE METABOLIC PANEL
ALT: 18 U/L (ref 0–35)
AST: 17 U/L (ref 0–37)
Albumin: 4.4 g/dL (ref 3.5–5.2)
Alkaline Phosphatase: 53 U/L (ref 39–117)
BUN: 15 mg/dL (ref 6–23)
CO2: 30 meq/L (ref 19–32)
Calcium: 9.8 mg/dL (ref 8.4–10.5)
Chloride: 106 mEq/L (ref 96–112)
Creatinine, Ser: 0.83 mg/dL (ref 0.40–1.20)
GFR: 71.79 mL/min (ref >60.00)
GLUCOSE: 90 mg/dL (ref 70–99)
POTASSIUM: 4.3 meq/L (ref 3.5–5.1)
SODIUM: 141 meq/L (ref 135–145)
TOTAL PROTEIN: 6.5 g/dL (ref 6.0–8.3)
Total Bilirubin: 0.5 mg/dL (ref 0.2–1.2)

## 2017-10-09 LAB — TSH: TSH: 0.39 u[IU]/mL (ref 0.35–4.50)

## 2017-10-09 NOTE — Progress Notes (Addendum)
Subjective:  Caroline Peters is a 72 y.o. year old very pleasant female patient who presents for/with See problem oriented charting ROS- hair loss, depressed mood, anhedonia. No SI. No chest pain or shortness of breath.    Past Medical History-  Patient Active Problem List   Diagnosis Date Noted  . Insomnia 06/23/2014    Priority: Medium  . Essential hypertension, benign 03/03/2014    Priority: Medium  . Hyperlipidemia 12/11/2011    Priority: Medium  . Osteopenia     Priority: Medium  . Hypothyroid     Priority: Medium  . Recurrent major depression in partial remission ([place]) 04/16/2007    Priority: Medium  . History of adenomatous polyp of colon 05/20/2015    Priority: Low  . Hair loss 05/20/2015    Priority: Low  . HA (headache)     Priority: Low  . Melanoma     Priority: Low  . Ganglion of right wrist 03/27/2017  . Abnormal finding on diagnostic imaging of extremity 02/20/2017  . Adhesive capsulitis of left shoulder 02/13/2017  . 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 ALPRAZolam (XANAX) 0.5 MG tablet TAKE 1/2 TABLET TWICE DAILY AS NEEDED. 30 tablet 1  . Biotin 1000 MCG tablet Take 1,000 mcg by mouth daily.    . calcium carbonate (OSCAL) 1500 (600 Ca) MG TABS tablet Take 1 tablet by mouth daily.    . cholecalciferol (VITAMIN D) 1000 units tablet Take 1,000 Units by mouth daily.    Marland Kitchen levothyroxine (SYNTHROID, LEVOTHROID) 88 MCG tablet TAKE 1 TABLET ONCE DAILY. 90 tablet 3  . lisinopril (PRINIVIL,ZESTRIL) 40 MG tablet TAKE 1 TABLET EACH DAY. 90 tablet 1  . rosuvastatin (CRESTOR) 10 MG tablet TAKE 1 TABLET ONCE DAILY. 90 tablet 3  . sertraline (ZOLOFT) 100 MG tablet TAKE 1 TABLET ONCE DAILY. 90 tablet 1  . zolpidem (AMBIEN) 5 MG tablet TAKE 1/2 TO 1 TABLET AT BEDTIME AS NEEDED. 30 tablet 2   No current facility-administered medications for this visit.     Objective: BP 134/68  (BP Location: Left Arm, Patient Position: Sitting, Cuff Size: Large)   Pulse 68   Temp 98.2 F (36.8 C) (Oral)   Ht 5\' 2"  (1.575 m)   Wt 134 lb 12.8 oz (61.1 kg)   SpO2 97%   BMI 24.66 kg/m  Gen: NAD, resting comfortably CV: RRR no murmurs rubs or gallops Lungs: CTAB no crackles, wheeze, rhonchi Abdomen: soft/nontender/nondistended/normal bowel sounds. Healthy weight Ext: no edema Skin: warm, dry Pscy: tearful when discussing her past/need for counseling  Assessment/Plan:  Essential hypertension, benign S: controlled on  lisinopril. Had hpercalcemia on hctz BP Readings from Last 3 Encounters:  10/09/17 134/68  03/27/17 100/60  02/13/17 122/64  A/P: We discussed blood pressure goal of <140/90- slightly higher than normal for her today. Continue current meds  Hyperlipidemia S: mild poorly controlled on last check- she is on crestor 10mg .  Lab Results  Component Value Date   CHOL 210 (H) 05/04/2016   HDL 92.70 05/04/2016   LDLCALC 104 (H) 05/04/2016   LDLDIRECT 85.2 03/03/2013   TRIG 69.0 05/04/2016   CHOLHDL 2 05/04/2016   A/P: luckily she is fasting- discussed updating lipids, she agrees. Having some myalgias- is going to try mustard first. If not effective we discussed magnesium or week trial of statin  Hypothyroid S: patient has been on levothyroxine 88 recently- had to adjust down  from 100 mcg back in 2017 or so. She complains of fatigue, hair loss.  Lab Results  Component Value Date   TSH 0.96 07/24/2016  A/P: will check TSh. Suspect hair loss may be more telogen effluvium (though 2% of folks on zoloft have hair loss)- states happens every frew years and comes out all at once for a few weeks or months then normalizes. Has seen dermatology in the past. I suspect fatigue may be due to poorly controlled depression.   Recurrent major depression in partial remission ([place]) S: poor control today with phq9 of 11.   previously well controlled. Also uses abien for sleep and  sparing xanax (#30 with 1 refill given back in November). zoloft 100mg   Patient has felt low energy for about 6 months. Has noted more hair loss in this time frame though has had long term issues- saw Dr. Tonia Brooms for this before she retired. Lack of energy- crying more. zoloft taking only 50mg  every other day in last few weeks- was thinking could be causing the hair loss.   A/P: Symptoms could be related to thyroid as far as fatigue and hair loss. Instead of every other day 50mg  zoloft advised daily 50mg  and no adjustments without discussing with me. Recheck 2 months- may need higher dose- if worsening symptoms or SI should see [place] sooner. She would prefer to stay on zoloft instead of switching to other medicine like prozac with <1% hair loss.   She also seems interested in seeing behavioral health- brings list of folks covered by insurance including Dr. Cheryln Manly and Dr. Marlowe Sax- I told her I think both would be excellent choices. She is also going to consider Dr. Glennon Hamilton and Trey Paula.   2-3 month follow up or sooner if needed  Lab/Order associations: Hypothyroidism, unspecified type - Plan: CBC, Comprehensive metabolic panel, TSH, Lipid panel  Hyperlipidemia, unspecified hyperlipidemia type - Plan: CBC, Comprehensive metabolic panel, TSH, Lipid panel  Return precautions advised.  Garret Reddish, MD

## 2017-10-09 NOTE — Assessment & Plan Note (Signed)
S: patient has been on levothyroxine 88 recently- had to adjust down from 100 mcg back in 2017 or so. She complains of fatigue, hair loss.  Lab Results  Component Value Date   TSH 0.96 07/24/2016  A/P: will check TSh. Suspect hair loss may be more telogen effluvium (though 2% of folks on zoloft have hair loss)- states happens every frew years and comes out all at once for a few weeks or months then normalizes. Has seen dermatology in the past. I suspect fatigue may be due to poorly controlled depression.

## 2017-10-09 NOTE — Assessment & Plan Note (Signed)
S: mild poorly controlled on last check- she is on crestor 10mg .  Lab Results  Component Value Date   CHOL 210 (H) 05/04/2016   HDL 92.70 05/04/2016   LDLCALC 104 (H) 05/04/2016   LDLDIRECT 85.2 03/03/2013   TRIG 69.0 05/04/2016   CHOLHDL 2 05/04/2016   A/P: luckily she is fasting- discussed updating lipids, she agrees. Having some myalgias- is going to try mustard first. If not effective we discussed magnesium or week trial of statin

## 2017-10-09 NOTE — Assessment & Plan Note (Signed)
S: controlled on  lisinopril. Had hpercalcemia on hctz BP Readings from Last 3 Encounters:  10/09/17 134/68  03/27/17 100/60  02/13/17 122/64  A/P: We discussed blood pressure goal of <140/90- slightly higher than normal for her today. Continue current meds

## 2017-10-09 NOTE — Assessment & Plan Note (Addendum)
S: poor control today with phq9 of 11.   previously well controlled. Also uses abien for sleep and sparing xanax (#30 with 1 refill given back in November). zoloft 100mg   Patient has felt low energy for about 6 months. Has noted more hair loss in this time frame though has had long term issues- saw Dr. Tonia Brooms for this before she retired. Lack of energy- crying more. zoloft taking only 50mg  every other day in last few weeks- was thinking could be causing the hair loss.   A/P: Symptoms could be related to thyroid as far as fatigue and hair loss. Instead of every other day 50mg  zoloft advised daily 50mg  and no adjustments without discussing with me. Recheck 2 months- may need higher dose- if worsening symptoms or SI should see [place] sooner. She would prefer to stay on zoloft instead of switching to other medicine like prozac with <1% hair loss.   She also seems interested in seeing behavioral health- brings list of folks covered by insurance including Dr. Cheryln Manly and Dr. Marlowe Sax- I told her I think both would be excellent choices. She is also going to consider Dr. Glennon Hamilton and Trey Paula.

## 2017-10-09 NOTE — Patient Instructions (Addendum)
I would also like for you to sign up for an annual wellness visit with one of our nurses, Cassie or Manuela Schwartz, who both specialize in the annual wellness visit. This is a free benefit under medicare that may help [place] find additional ways to help you. Some highlights are reviewing medications, lifestyle, and doing a dementia screen.   Please stop by lab before you go  Please take at least 50mg  zoloft daily (half pill of 100mg ) this should produce less ups and downs- it may not be enough but with counseling it may be enough. Lets reevaluate 2-3 months from now. If you have worsening symptoms or thoughts of self harm please see [place] sooner.

## 2017-10-11 ENCOUNTER — Other Ambulatory Visit: Payer: Self-pay

## 2017-10-11 MED ORDER — ROSUVASTATIN CALCIUM 20 MG PO TABS: 20.0000 mg | ORAL_TABLET | Freq: Every day | ORAL | 3 refills | Status: AC

## 2017-10-31 ENCOUNTER — Ambulatory Visit
Admit: 2017-10-31 | Discharge: 2017-10-31 | Payer: PRIVATE HEALTH INSURANCE | Attending: Internal Medicine | Primary: Internal Medicine

## 2017-10-31 DIAGNOSIS — H1132 Conjunctival hemorrhage, left eye: Secondary | ICD-10-CM | POA: Diagnosis not present

## 2017-10-31 DIAGNOSIS — E119 Type 2 diabetes mellitus without complications: Secondary | ICD-10-CM

## 2017-10-31 MED ORDER — SEMAGLUTIDE 1 MG/DOSE (2 MG/1.5 ML) SUBCUTANEOUS PEN INJECTOR
1 mg/dose (2 mg/.5 mL) | SUBCUTANEOUS | 11 refills | Status: DC
Start: 2017-10-31 — End: 2018-12-17

## 2017-10-31 MED ORDER — SEMAGLUTIDE 0.25 MG OR 0.5 MG (2 MG/1.5 ML) SUBCUTANEOUS PEN INJECTOR
0.25 mg or 0.5 mg(2 mg/1.5 mL) | SUBCUTANEOUS | 0 refills | Status: DC
Start: 2017-10-31 — End: 2018-06-03

## 2017-10-31 NOTE — Patient Instructions (Addendum)
Diabetes.    Watch carbohydrate intake with meals and aim to keep this less than 45 grams per meal.    Medications:  Continue metformin to 1000 mg twice daily    Start Ozempic  0.25 mg x 4 weeks and then increase to 0.5 mg weekly  OR   If you do well with 0.25 mg, you can increase to 0.5 mg dose after 2 weeks of 0.25 mg dose    Prescription for 1.0 mg pen - take 1/2 dose/ 0.5 mg weekly - I believe this is 38 clicks.     NPH - Novolin N - 20 units in AM , 25 units in PM    - We will adjust dose based on glucoses.  PM dose - follow trend from bedtime to fasting to adjust  AM dose - follow trend from morning to dinner to adjust dos  ** We should lower dose for values <100 - would decrease doses in 5-10 unit increments    Goals for blood glucose:  Fasting 80-130 (less than 150)  Lunch, Dinner, Bedtime - 80-150 (less than 180).    Blood pressure:  Continue indapamide 2.5 mg daily  Continue amlodipine 5 mg + benazepril     ** check at home. Sit for 5 minutes and take several readings  If most are > 140 for top number, we should change indapamide to chlorthalidone 25.

## 2017-10-31 NOTE — Progress Notes (Signed)
History of Present Illness: Jamie Park is a 72 y.o. female presents for follow-up of diabetes.  she has had diabetes for since 1989.  Most recent A1c was 7.7 in 09/2016, down from 9.4 in 07/2017    Diabetes related complications:  none    Current diabetes regimen:  Metformin 500 mg twice daily  NPH - 20 units in AM, 25 units in evening.    Glucoses:  150 fasting.   Before dinner - as high as 200.   Bedtime: 128 - 3 am last night.     Diet:  Oatmeal or raisin bran      HTN: Currently taking amlodipine, benazepril and indapamide 2.5.   139 at home.     Dyslipidemia: Prescribed rosuvastatin    Social:  Husband had hip replacement.       Past Medical History:   Diagnosis Date   ??? Breast cancer (HCC) 1988   ??? Diabetes mellitus (HCC)     1989     Current Outpatient Medications   Medication Sig   ??? Insulin Syringe-Needle U-100 0.3 mL 30 syrg UTD   ??? levothyroxine (SYNTHROID) 75 mcg tablet Take 1 Tab by mouth Daily (before breakfast). For hypothyroidism   ??? insulin NPH (NOVOLIN N, HUMULIN N) 100 unit/mL injection Inject 15 units subcutaneously twice daily   ??? indapamide (LOZOL) 2.5 mg tablet Take 1 Tab by mouth daily. For blood pressure. Replaces HCTZ   ??? HUMULIN N NPH INSULIN KWIKPEN 100 unit/mL (3 mL) inpn 15 units twice daily.   ??? TRUE METRIX GLUCOSE TEST STRIP strip    ??? benazepril (LOTENSIN) 40 mg tablet take 1 tablet by mouth once daily   ??? amLODIPine (NORVASC) 5 mg tablet take 1 tablet by mouth once daily   ??? metFORMIN (GLUCOPHAGE) 500 mg tablet Take 2 Tabs by mouth two (2) times daily (with meals).   ??? dulaglutide (TRULICITY) 0.75 mg/0.5 mL sub-q pen 0.5 mL by SubCUTAneous route every seven (7) days.   ??? BD ULTRA-FINE SHORT PEN NEEDLE 31 gauge x 5/16" ndle    ??? BD INSULIN SYRINGE ULTRA-FINE 0.5 mL 31 gauge x 5/16 syrg    ??? fluticasone (FLONASE) 50 mcg/actuation nasal spray as needed.     No current facility-administered medications for this visit.      Not on File    Review of Systems:   - Eyes: no blurry vision or double vision  - Cardiovascular: no chest pain  - Respiratory: no shortness of breath  - Musculoskeletal: no myalgias  - Neurological: no numbness/tingling in extremities    Physical Examination:  Visit Vitals  BP 173/82 (BP 1 Location: Right arm, BP Patient Position: Sitting)   Pulse 61   Ht 5\' 4"  (1.626 m)   Wt 185 lb 9.6 oz (84.2 kg)   BMI 31.86 kg/m??   -   - General: pleasant, no distress, normal gait   HEENT: hearing intact, EOMI, clear sclera without icterus  - Neck: no thyromegaly or LAD  - Cardiovascular: regular, normal rate   - Respiratory: normal effort  - Integumentary: no edema  - Psychiatric: normal mood and affect    Data Reviewed:   Lab Results   Component Value Date/Time    Hemoglobin A1c 9.4 08/02/2017 03:21 PM    Hemoglobin A1c, External 7.9 01/04/2017      Lab Results   Component Value Date/Time    Sodium 141 08/02/2017 03:21 PM    Potassium 4.4 08/02/2017 03:21 PM  Creatinine 0.94 08/02/2017 03:21 PM    Creatinine, External 0.95 01/04/2017        Lab Results   Component Value Date/Time    LDL-C, External 136 01/04/2017      Lab Results   Component Value Date/Time    TSH 10.460 08/02/2017 03:21 PM    T4, Free 1.14 08/02/2017 03:21 PM    Thyroid peroxidase Ab 11 05/03/2017 04:02 PM      09/2017  TSH 1  A1c 7.7  LDL 52.     Assessment/Plan:   1. Type 2 diabetes mellitus without complication, with long-term current use of insulin (HCC)   - not controlled  - resume Ozempic. Will prescribe 1.0 mg. She will start with 1/2 mg dose  - continue metrormin  - encouraged dietary efforts.   - lower NPH in 5 unit increments for lower values.    2. Acquired hypothyroidism   - continue 75 mcg. TSH now normal    3. HTN (hypertension), benign   - not controlled. Very high  - monitor at home.  May need to replace indapamide with chlorthalidone to control BP.   Continue amlodipine + benazepril. Could increase former as well.    4. Dyslipidemia    Triglycerides 178 - weight loss, exercise and lower carb intake will help.    5. BMI 31.0-31.9,adult   - continue dietary and exercise efforts.      Patient Instructions   Diabetes.    Watch carbohydrate intake with meals and aim to keep this less than 45 grams per meal.    Medications:  Continue metformin to 1000 mg twice daily    Start Ozempic  0.25 mg x 4 weeks and then increase to 0.5 mg weekly  OR   If you do well with 0.25 mg, you can increase to 0.5 mg dose after 2 weeks of 0.25 mg dose    Prescription for 1.0 mg pen - take 1/2 dose/ 0.5 mg weekly - I believe this is 38 clicks.     NPH - Novolin N - 20 units in AM , 25 units in PM    - We will adjust dose based on glucoses.  PM dose - follow trend from bedtime to fasting to adjust  AM dose - follow trend from morning to dinner to adjust dos  ** We should lower dose for values <100 - would decrease doses in 5-10 unit increments    Goals for blood glucose:  Fasting 80-130 (less than 150)  Lunch, Dinner, Bedtime - 80-150 (less than 180).    Blood pressure:  Continue indapamide 2.5 mg daily  Continue amlodipine 5 mg + benazepril     ** check at home. Sit for 5 minutes and take several readings  If most are > 140 for top number, we should change indapamide to chlorthalidone 25.     Follow-up Disposition:  Return in about 2 months (around 12/29/2017).      Copy sent to:

## 2017-11-04 ENCOUNTER — Ambulatory Visit: Payer: Medicare Other | Admitting: Physician Assistant

## 2017-11-04 ENCOUNTER — Ambulatory Visit (INDEPENDENT_AMBULATORY_CARE_PROVIDER_SITE_OTHER)
Admission: RE | Admit: 2017-11-04 | Discharge: 2017-11-04 | Disposition: A | Discharge status: Home / Self Care | Payer: Medicare Other | Source: Ambulatory Visit | Attending: Physician Assistant | Admitting: Physician Assistant

## 2017-11-04 ENCOUNTER — Ambulatory Visit (INDEPENDENT_AMBULATORY_CARE_PROVIDER_SITE_OTHER): Payer: Medicare Other

## 2017-11-04 ENCOUNTER — Other Ambulatory Visit: Payer: Self-pay | Admitting: Physician Assistant

## 2017-11-04 ENCOUNTER — Encounter: Payer: Self-pay | Admitting: Physician Assistant

## 2017-11-04 VITALS — BP 110/68 | HR 75 | Temp 97.7°F | Ht 62.0 in | Wt 133.5 lb

## 2017-11-04 DIAGNOSIS — R05 Cough: Secondary | ICD-10-CM | POA: Diagnosis not present

## 2017-11-04 DIAGNOSIS — R509 Fever, unspecified: Secondary | ICD-10-CM

## 2017-11-04 DIAGNOSIS — R197 Diarrhea, unspecified: Secondary | ICD-10-CM | POA: Diagnosis not present

## 2017-11-04 DIAGNOSIS — R1030 Lower abdominal pain, unspecified: Secondary | ICD-10-CM

## 2017-11-04 DIAGNOSIS — R059 Cough, unspecified: Secondary | ICD-10-CM

## 2017-11-04 LAB — COMPREHENSIVE METABOLIC PANEL
ALT: 27 U/L (ref 0–35)
AST: 29 U/L (ref 0–37)
Albumin: 4.4 g/dL (ref 3.5–5.2)
Alkaline Phosphatase: 79 U/L (ref 39–117)
BUN: 20 mg/dL (ref 6–23)
CALCIUM: 10.3 mg/dL (ref 8.4–10.5)
CHLORIDE: 103 meq/L (ref 96–112)
CO2: 28 meq/L (ref 19–32)
CREATININE: 0.86 mg/dL (ref 0.40–1.20)
GFR: 68.89 mL/min (ref >60.00)
GLUCOSE: 101 mg/dL — AB (ref 70–99)
Potassium: 3.6 mEq/L (ref 3.5–5.1)
Sodium: 140 mEq/L (ref 135–145)
Total Bilirubin: 0.4 mg/dL (ref 0.2–1.2)
Total Protein: 7.8 g/dL (ref 6.0–8.3)

## 2017-11-04 LAB — POCT URINALYSIS DIPSTICK
BILIRUBIN UA: NEGATIVE
Glucose, UA: NEGATIVE
KETONES UA: NEGATIVE
NITRITE UA: NEGATIVE
PH UA: 6 (ref 5.0–8.0)
Protein, UA: NEGATIVE
RBC UA: NEGATIVE
UROBILINOGEN UA: 0.2 U/dL

## 2017-11-04 LAB — CBC
HCT: 41.1 % (ref 36.0–46.0)
Hemoglobin: 13.8 g/dL (ref 12.0–15.0)
MCHC: 33.7 g/dL (ref 30.0–36.0)
MCV: 93.2 fl (ref 78.0–100.0)
Platelets: 213 10*3/uL (ref 150.0–400.0)
RBC: 4.41 Mil/uL (ref 3.87–5.11)
RDW: 13.1 % (ref 11.5–15.5)
WBC: 3.9 10*3/uL — ABNORMAL LOW (ref 4.0–10.5)

## 2017-11-04 LAB — POC INFLUENZA A&B (BINAX/QUICKVUE)
Influenza A, POC: NEGATIVE
Influenza B, POC: NEGATIVE

## 2017-11-04 MED ORDER — CIPROFLOXACIN HCL 500 MG PO TABS: 500.0000 mg | ORAL_TABLET | Freq: Two times a day (BID) | ORAL | 0 refills | Status: AC

## 2017-11-04 MED ORDER — METRONIDAZOLE 500 MG PO TABS: 500.0000 mg | ORAL_TABLET | Freq: Three times a day (TID) | ORAL | 0 refills | Status: AC

## 2017-11-04 MED ORDER — BENZONATATE 100 MG PO CAPS: 100.0000 mg | ORAL_CAPSULE | Freq: Two times a day (BID) | ORAL | 0 refills | Status: DC | PRN

## 2017-11-04 MED ORDER — IOPAMIDOL (ISOVUE-300) INJECTION 61%
100.0000 mL | Freq: Once | INTRAVENOUS | Status: AC | PRN
  Administered 2017-11-04: 100 mL via INTRAVENOUS

## 2017-11-04 NOTE — Progress Notes (Signed)
Caroline Peters is a 72 y.o. female here for a new problem.  I acted as a Education administrator for [employer], PA-C Anselmo Pickler, LPN  History of Present Illness:   Chief Complaint  Patient presents with  . Cough  . Abdominal Pain    Cough  This is a new problem. Episode onset: Started last Tuesday night. The problem has been gradually worsening. The problem occurs constantly. The cough is non-productive. Associated symptoms include chills, a fever, headaches and postnasal drip. Pertinent negatives include no ear congestion, ear pain, heartburn, nasal congestion, sore throat, shortness of breath or wheezing. Associated symptoms comments: Chest congestion, hurts when coughs. Fever 100 to 101. Diarrhea yesterday.. The symptoms are aggravated by lying down. She has tried OTC cough suppressant (Aspirin or Advil) for the symptoms. The treatment provided no relief. There is no history of asthma or bronchitis. Hx walking pneumonia   Granddaughter was diagnosed with flu (58 y/o) about a week and half ago. Appetite is fair. Yesterday had diarrhea. No blood in diarrhea. No concerning food intake. No recent travel. Pushing fluids. Urine is very dark today. Feeling fatigued. She has tried some Cheratussin for her cough which isn't helping. She reports that her abdominal pain is "menstrual cramp like" and tender with slight touch. Symptoms abdominal pain started, she states that it has been gradually worsening.   Past Medical History:  Diagnosis Date  . Anxiety   . Cancer ([place])    Melanoma- on arm age 28   . Chronic kidney disease    kidney stone age 26  . DEPRESSION 04/16/2007  . Fibroadenoma of breast   . HA (headache)    hx of   . Hyperlipidemia   . Hypertension   . Hypothyroid   . HYPOTHYROIDISM 04/16/2007  . Osteopenia 06/2013   T score -2.0 FRAX 8.8%/0.8% stable from prior DEXA 2012     Social History   Socioeconomic History  . Marital status: Married    Spouse name: [name]  . Number of  children: Not on file  . Years of education: college  . Highest education level: Not on file  Social Needs  . Financial resource strain: Not on file  . Food insecurity - worry: Not on file  . Food insecurity - inability: Not on file  . Transportation needs - medical: Not on file  . Transportation needs - non-medical: Not on file  Occupational History  . Not on file  Tobacco Use  . Smoking status: Never Smoker  . Smokeless tobacco: Never Used  Substance and Sexual Activity  . Alcohol use: Yes    Alcohol/week: 8.4 oz    Types: 14 Standard drinks or equivalent per week    Comment: wine daily 14 glasses per week  . Drug use: No  . Sexual activity: Yes    Birth control/protection: Post-menopausal  Other Topics Concern  . Not on file  Social History Narrative   Patient lives at home with her husband [name]). 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      Education : 2 years [employer], 2 years [employer]      Hobbies: grandchildren time, garden, reading, [employer]      Right handed. Caffeine one cup daily    Past Surgical History:  Procedure Laterality Date  . BREAST SURGERY     bx left breast- and  removal of fibroadenoma  . COLONOSCOPY    . EXCISION OF MELANOMA  1978  . HYSTEROSCOPY  1988   HYSTEROSCOPY,D&C  . LAPAROSCOPIC APPENDECTOMY N/A 11/10/2015   Procedure: APPENDECTOMY LAPAROSCOPIC W PARTIAL CECECTOMY ;  Surgeon: Jackolyn Confer, MD;  Location: [facility];  Service: General;  Laterality: N/A;  . LAPAROSCOPIC ILEOCECECTOMY N/A 11/10/2015   Procedure: LAPAROSCOPIC ILEOCECECTOMY;  Surgeon: Jackolyn Confer, MD;  Location: [facility];  Service: General;  Laterality: N/A;  . MASS EXCISION Right 02/23/2014   Procedure: BIOPSY TEMPORAL ARTERY RIGHT;  Surgeon: Rozetta Nunnery, MD;  Location: [facility];  Service: ENT;  Laterality: Right;  . POLYPECTOMY       Family History  Problem Relation Age of Onset  . Diabetes Mother   . Heart disease Mother 57  . Dementia Mother   . Diabetes Father   . Heart disease Father 61  . Cancer Father        Skin cancer-led to death  . Breast cancer Maternal Grandmother        Age 53  . Colon cancer Other        first cousin dx'd in his 42's  . Stomach cancer Neg Hx   . Colon polyps Neg Hx   . Esophageal cancer Neg Hx   . Rectal cancer Neg Hx     Allergies  Allergen Reactions  . Compazine Other (See Comments)    extrapyramidal syndrome  . Prednisone Other (See Comments)    Mood swings - crying     Current Medications:   Current Outpatient Medications:  .  ALPRAZolam (XANAX) 0.5 MG tablet, TAKE 1/2 TABLET TWICE DAILY AS NEEDED., Disp: 30 tablet, Rfl: 1 .  Biotin 1000 MCG tablet, Take 1,000 mcg by mouth daily., Disp: , Rfl:  .  calcium carbonate (OSCAL) 1500 (600 Ca) MG TABS tablet, Take 1 tablet by mouth daily., Disp: , Rfl:  .  levothyroxine (SYNTHROID, LEVOTHROID) 88 MCG tablet, TAKE 1 TABLET ONCE DAILY., Disp: 90 tablet, Rfl: 3 .  lisinopril (PRINIVIL,ZESTRIL) 40 MG tablet, TAKE 1 TABLET EACH DAY., Disp: 90 tablet, Rfl: 1 .  rosuvastatin (CRESTOR) 20 MG tablet, Take 1 tablet (20 mg total) by mouth daily., Disp: 90 tablet, Rfl: 3 .  sertraline (ZOLOFT) 100 MG tablet, TAKE 1 TABLET ONCE DAILY., Disp: 90 tablet, Rfl: 1 .  zolpidem (AMBIEN) 5 MG tablet, TAKE 1/2 TO 1 TABLET AT BEDTIME AS NEEDED., Disp: 30 tablet, Rfl: 2   Review of Systems:   Review of Systems  Constitutional: Positive for chills and fever.  HENT: Positive for postnasal drip. Negative for ear pain and sore throat.   Respiratory: Positive for cough. Negative for shortness of breath and wheezing.   Gastrointestinal: Positive for abdominal pain and diarrhea. Negative for blood in stool, constipation, heartburn, nausea and vomiting.  Neurological: Positive for headaches.    Vitals:   Vitals:   11/04/17 0859  BP: 110/68   Pulse: 75  Temp: 97.7 F (36.5 C)  TempSrc: Oral  SpO2: 96%  Weight: 133 lb 8 oz (60.6 kg)  Height: 5\' 2"  (1.575 m)     Body mass index is 24.42 kg/m.  Physical Exam:   Physical Exam  Constitutional: She appears well-developed. She is cooperative.  Non-toxic appearance. She does not have a sickly appearance. She does not appear ill. No distress.  HENT:  Head: Normocephalic and atraumatic.  Right Ear: Tympanic membrane, external ear and ear canal normal. Tympanic membrane is not erythematous, not  retracted and not bulging.  Left Ear: Tympanic membrane, external ear and ear canal normal. Tympanic membrane is not erythematous, not retracted and not bulging.  Nose: Mucosal edema and rhinorrhea present. Right sinus exhibits no maxillary sinus tenderness and no frontal sinus tenderness. Left sinus exhibits no maxillary sinus tenderness and no frontal sinus tenderness.  Mouth/Throat: Uvula is midline and mucous membranes are normal. Posterior oropharyngeal erythema present. No posterior oropharyngeal edema. Tonsils are 0 on the right. Tonsils are 0 on the left.  Eyes: Conjunctivae and lids are normal.  Neck: Trachea normal.  Cardiovascular: Normal rate, regular rhythm, S1 normal, S2 normal and normal heart sounds.  Pulmonary/Chest: Effort normal and breath sounds normal. She has no decreased breath sounds. She has no wheezes. She has no rhonchi. She has no rales.  Abdominal: Normal appearance. Bowel sounds are decreased. There is tenderness in the right lower quadrant and left lower quadrant. There is no rigidity, no rebound, no guarding and no CVA tenderness.  Tenderness to lower quadrant even during light auscultation.  Lymphadenopathy:    She has no cervical adenopathy.  Neurological: She is alert.  Skin: Skin is warm, dry and intact.  Psychiatric: She has a normal mood and affect. Her speech is normal and behavior is normal.  Nursing note and vitals reviewed.   Results for orders  placed or performed in visit on 11/04/17  CBC  Result Value Ref Range   WBC 3.9 (L) 4.0 - 10.5 K/uL   RBC 4.41 3.87 - 5.11 Mil/uL   Platelets 213.0 150.0 - 400.0 K/uL   Hemoglobin 13.8 12.0 - 15.0 g/dL   HCT 41.1 36.0 - 46.0 %   MCV 93.2 78.0 - 100.0 fl   MCHC 33.7 30.0 - 36.0 g/dL   RDW 13.1 11.5 - 15.5 %  Comprehensive metabolic panel  Result Value Ref Range   Sodium 140 135 - 145 mEq/L   Potassium 3.6 3.5 - 5.1 mEq/L   Chloride 103 96 - 112 mEq/L   CO2 28 19 - 32 mEq/L   Glucose, Bld 101 (H) 70 - 99 mg/dL   BUN 20 6 - 23 mg/dL   Creatinine, Ser 0.86 0.40 - 1.20 mg/dL   Total Bilirubin 0.4 0.2 - 1.2 mg/dL   Alkaline Phosphatase 79 39 - 117 U/L   AST 29 0 - 37 U/L   ALT 27 0 - 35 U/L   Total Protein 7.8 6.0 - 8.3 g/dL   Albumin 4.4 3.5 - 5.2 g/dL   Calcium 10.3 8.4 - 10.5 mg/dL   GFR 68.89 >60.00 mL/min  POC Influenza A&B(BINAX/QUICKVUE)  Result Value Ref Range   Influenza A, POC Negative Negative   Influenza B, POC Negative Negative  POCT urinalysis dipstick  Result Value Ref Range   Color, UA Yellow    Clarity, UA Clear    Glucose, UA Negative    Bilirubin, UA Negative    Ketones, UA Negative    Spec Grav, UA >=1.030 (A) 1.010 - 1.025   Blood, UA Negative    pH, UA 6.0 5.0 - 8.0   Protein, UA Negative    Urobilinogen, UA 0.2 0.2 or 1.0 E.U./dL   Nitrite, UA Negative    Leukocytes, UA Trace (A) Negative   Appearance     Odor     CLINICAL DATA:  Nonproductive cough for 1 week  EXAM: CHEST  2 VIEW  COMPARISON:  11/07/2009  FINDINGS: The heart size and mediastinal contours are within normal limits. Both lungs  are clear. The visualized skeletal structures are unremarkable.  IMPRESSION: No active cardiopulmonary disease.   Electronically Signed   By: Inez Catalina M.D.   On: 11/04/2017 09:56  Assessment and Plan:    Odelle was seen today for cough and abdominal pain.  Diagnoses and all orders for this visit:  Cough Suspect upper  respiratory infection, possibly bronchitis given frequency in nature cough. Chest x-ray was normal. No red flags on exam in regards to her HEENT exam. She has failed reported measures and over-the-counter medication for her cough. Chest xray negative for PNA. Suspect URI. -     POC Influenza A&B(BINAX/QUICKVUE) -     DG Chest 2 View; Future  Fever, unspecified fever cause Urinalysis with slight leuks. CXR without evidence of PNA. Possible early sinusitis/bronchitis. She does have significant abdominal tenderness, we are getting a stat CT for further evaluation of this, I want to rule out abdominal etiology of her fever. -     POC Influenza A&B(BINAX/QUICKVUE) -     POCT urinalysis dipstick -     CBC -     Comprehensive metabolic panel -     DG Chest 2 View; Future  Lower abdominal pain Given the markedly tenderness on her exam, I would like for her to have a stat CT done. She is agreeable to this. Further treatment based on CT scan results.  She is going to have this performed at 3:30pm today. -     POCT urinalysis dipstick -     CBC -     Comprehensive metabolic panel -     CT ABDOMEN PELVIS W CONTRAST; Future   . Reviewed expectations re: course of current medical issues. . Discussed self-management of symptoms. . Outlined signs and symptoms indicating need for more acute intervention. . Patient verbalized understanding and all questions were answered. . See orders for this visit as documented in the electronic medical record. . Patient received an After-Visit Summary.  CMA or LPN served as scribe during this visit. History, Physical, and Plan performed by medical provider. Documentation and orders reviewed and attested to.  Inda Coke, PA-C

## 2017-11-04 NOTE — Patient Instructions (Addendum)
It was great to meet you!  We will call you with your lab results and imaging results.  If your abdominal pain worsens in any way, please go to the emergency room.

## 2017-11-05 ENCOUNTER — Encounter: Payer: Self-pay | Admitting: Physician Assistant

## 2017-11-05 DIAGNOSIS — K5792 Diverticulitis of intestine, part unspecified, without perforation or abscess without bleeding: Secondary | ICD-10-CM | POA: Insufficient documentation

## 2017-11-06 DIAGNOSIS — Z85820 Personal history of malignant melanoma of skin: Secondary | ICD-10-CM | POA: Diagnosis not present

## 2017-11-06 DIAGNOSIS — L650 Telogen effluvium: Secondary | ICD-10-CM | POA: Diagnosis not present

## 2017-11-08 ENCOUNTER — Ambulatory Visit: Payer: Self-pay

## 2017-11-08 NOTE — Telephone Encounter (Signed)
See note

## 2017-11-08 NOTE — Telephone Encounter (Signed)
Called patient and she state she has been pain free for the last couple of hours. She states that she thinks it is related to her diverticulitis as the pain feels the same. I did tell her to call back if she feels she needs to be seen over the weekend (Saturday clinic) if her pain worsened. I did advise that if her pain became really sever she could also go to the ED. She verbalized understanding

## 2017-11-08 NOTE — Telephone Encounter (Signed)
Pt. Reports she occasionally has a stabbing pain in her left lower quad of abdomen. Wants the doctor to be aware and is this to be expected with her newly diagnosed diverticulitis. No other symptoms. Answer Assessment - Initial Assessment Questions 1. LOCATION: "Where does it hurt?"      Left lower side 2. RADIATION: "Does the pain shoot anywhere else?" (e.g., chest, back)     No 3. ONSET: "When did the pain begin?" (e.g., minutes, hours or days ago)      During the night Gradual 4. SUDDEN: "Gradual or sudden onset?"     Gradual 5. PATTERN "Does the pain come and go, or is it constant?"    - If constant: "Is it getting better, staying the same, or worsening?"      (Note: Constant means the pain never goes away completely; most serious pain is constant and it progresses)     - If intermittent: "How long does it last?" "Do you have pain now?"     (Note: Intermittent means the pain goes away completely between bouts)     Come and goes 6. SEVERITY: "How bad is the pain?"  (e.g., Scale 1-10; mild, moderate, or severe)   - MILD (1-3): doesn't interfere with normal activities, abdomen soft and not tender to touch    - MODERATE (4-7): interferes with normal activities or awakens from sleep, tender to touch    - SEVERE (8-10): excruciating pain, doubled over, unable to do any normal activities      No pain. Feels like astabbing pain and then is gone 7. RECURRENT SYMPTOM: "Have you ever had this type of abdominal pain before?" If so, ask: "When was the last time?" and "What happened that time?"      Yes 8. CAUSE: "What do you think is causing the abdominal pain?"     Diverticulitis 9. RELIEVING/AGGRAVATING FACTORS: "What makes it better or worse?" (e.g., movement, antacids, bowel movement)     No 10. OTHER SYMPTOMS: "Has there been any vomiting, diarrhea, constipation, or urine problems?"       No 11. PREGNANCY: "Is there any chance you are pregnant?" "When was your last menstrual period?"  No  Protocols used: ABDOMINAL PAIN - [facility]

## 2017-11-11 ENCOUNTER — Telehealth: Payer: Self-pay | Admitting: Family Medicine

## 2017-11-11 NOTE — Telephone Encounter (Signed)
The patient would like a call. She saw Morene Rankins but she believes she is suppose to be on a specific diet. She is concerned about what she should be eating and would like some clarification.

## 2017-11-12 ENCOUNTER — Ambulatory Visit: Payer: Medicare Other | Admitting: Family Medicine

## 2017-11-12 ENCOUNTER — Ambulatory Visit: Payer: Self-pay | Admitting: *Deleted

## 2017-11-12 ENCOUNTER — Emergency Department (HOSPITAL_COMMUNITY)
Admission: EM | Admit: 2017-11-12 | Discharge: 2017-11-12 | Disposition: A | Discharge status: Home / Self Care | Payer: Medicare Other | Attending: Emergency Medicine | Admitting: Emergency Medicine

## 2017-11-12 ENCOUNTER — Other Ambulatory Visit: Payer: Self-pay

## 2017-11-12 ENCOUNTER — Telehealth: Payer: Self-pay | Admitting: Family Medicine

## 2017-11-12 ENCOUNTER — Encounter (HOSPITAL_COMMUNITY): Payer: Self-pay

## 2017-11-12 ENCOUNTER — Emergency Department (INDEPENDENT_AMBULATORY_CARE_PROVIDER_SITE_OTHER): Payer: Medicare Other

## 2017-11-12 ENCOUNTER — Encounter: Payer: Self-pay | Admitting: Family Medicine

## 2017-11-12 VITALS — BP 114/70 | HR 61 | Temp 97.9°F | Ht 62.0 in | Wt 133.0 lb

## 2017-11-12 DIAGNOSIS — K5780 Diverticulitis of intestine, part unspecified, with perforation and abscess without bleeding: Secondary | ICD-10-CM | POA: Diagnosis not present

## 2017-11-12 DIAGNOSIS — R109 Unspecified abdominal pain: Secondary | ICD-10-CM | POA: Diagnosis not present

## 2017-11-12 DIAGNOSIS — K5792 Diverticulitis of intestine, part unspecified, without perforation or abscess without bleeding: Secondary | ICD-10-CM | POA: Diagnosis not present

## 2017-11-12 DIAGNOSIS — Z5321 Procedure and treatment not carried out due to patient leaving prior to being seen by health care provider: Secondary | ICD-10-CM | POA: Diagnosis not present

## 2017-11-12 LAB — LIPASE, BLOOD: LIPASE: 38 U/L (ref 11–51)

## 2017-11-12 LAB — URINALYSIS, ROUTINE W REFLEX MICROSCOPIC
Bilirubin Urine: NEGATIVE
GLUCOSE, UA: NEGATIVE mg/dL
Hgb urine dipstick: NEGATIVE
KETONES UR: NEGATIVE mg/dL
LEUKOCYTES UA: NEGATIVE
Nitrite: NEGATIVE
PH: 6 (ref 5.0–8.0)
Protein, ur: NEGATIVE mg/dL
Specific Gravity, Urine: 1.011 (ref 1.005–1.030)

## 2017-11-12 LAB — COMPREHENSIVE METABOLIC PANEL
ALT: 30 U/L (ref 14–54)
AST: 30 U/L (ref 15–41)
Albumin: 4 g/dL (ref 3.5–5.0)
Alkaline Phosphatase: 53 U/L (ref 38–126)
Anion gap: 9 (ref 5–15)
BUN: 9 mg/dL (ref 6–20)
CHLORIDE: 106 mmol/L (ref 101–111)
CO2: 24 mmol/L (ref 22–32)
Calcium: 9.9 mg/dL (ref 8.9–10.3)
Creatinine, Ser: 0.94 mg/dL (ref 0.44–1.00)
GFR calc Af Amer: >60 mL/min (ref >60)
GFR calc non Af Amer: 59 mL/min — ABNORMAL LOW (ref >60)
GLUCOSE: 93 mg/dL (ref 65–99)
POTASSIUM: 3.9 mmol/L (ref 3.5–5.1)
SODIUM: 139 mmol/L (ref 135–145)
Total Bilirubin: 0.3 mg/dL (ref 0.3–1.2)
Total Protein: 6.2 g/dL — ABNORMAL LOW (ref 6.5–8.1)

## 2017-11-12 LAB — CBC
HEMATOCRIT: 39.1 % (ref 36.0–46.0)
Hemoglobin: 13 g/dL (ref 12.0–15.0)
MCH: 31 pg (ref 26.0–34.0)
MCHC: 33.2 g/dL (ref 30.0–36.0)
MCV: 93.1 fL (ref 78.0–100.0)
Platelets: 279 10*3/uL (ref 150–400)
RBC: 4.2 MIL/uL (ref 3.87–5.11)
RDW: 12.7 % (ref 11.5–15.5)
WBC: 5.9 10*3/uL (ref 4.0–10.5)

## 2017-11-12 MED ORDER — AMOXICILLIN-POT CLAVULANATE 875-125 MG PO TABS: 1.0000 | ORAL_TABLET | Freq: Two times a day (BID) | ORAL | 0 refills | Status: DC

## 2017-11-12 NOTE — Patient Instructions (Signed)
Please start the augmentin.  Let me know if your symptoms worsen or if they are not improving over the next several days.  Take care, Dr. Jerline Pain

## 2017-11-12 NOTE — Telephone Encounter (Signed)
Copied from [place]. Topic: Quick Communication - See Telephone Encounter >> Nov 12, 2017  2:19 PM Arletha Grippe wrote: CRM for notification. See Telephone encounter for:   11/12/17. Pt called, she left the ed after being there for 4 hours without treatment.  She is asking or guidance form Roselyn Reef.  Pt would like to get medical advice from someone who will not send her to the ED to waste her day.  She has asked whether or not she should go to GI, and also said she wanted to switch primary care providers.  Pt staes she has abdominal pain, but it is not severe and comes and goes.  Please call [phone]

## 2017-11-12 NOTE — Telephone Encounter (Addendum)
Pt called with complaints of abdominal pain; she states that she vomited once yesterday; the pain is underneath her breast to her pelvic region R>L today; she also states that she has a right ovarian cyst on her appointment with GYN is next week; nurse triage with recommendation to include going to the ED;  pt wants to be seen in the office; will route to office for notification; pt already scheduled in office today per [facility] agent, at 1300; please contact the pt at [phone]. Reason for Disposition . [1] SEVERE pain (e.g., excruciating) AND [2] present > 1 hour  Answer Assessment - Initial Assessment Questions 1. LOCATION: "Where does it hurt?"      Underneath breast 2. RADIATION: "Does the pain shoot anywhere else?" (e.g., chest, back)     Down to pelvis 3. ONSET: "When did the pain begin?" (e.g., minutes, hours or days ago)      11/11/17 4. SUDDEN: "Gradual or sudden onset?"     Sudden and sharp  5. PATTERN "Does the pain come and go, or is it constant?"    - If constant: "Is it getting better, staying the same, or worsening?"      (Note: Constant means the pain never goes away completely; most serious pain is constant and it progresses)     - If intermittent: "How long does it last?" "Do you have pain now?"     (Note: Intermittent means the pain goes away completely between bouts)     constant 6. SEVERITY: "How bad is the pain?"  (e.g., Scale 1-10; mild, moderate, or severe)   - MILD (1-3): doesn't interfere with normal activities, abdomen soft and not tender to touch    - MODERATE (4-7): interferes with normal activities or awakens from sleep, tender to touch    - SEVERE (8-10): excruciating pain, doubled over, unable to do any normal activities      Severe; rated 8 out of 10 7. RECURRENT SYMPTOM: "Have you ever had this type of abdominal pain before?" If so, ask: "When was the last time?" and "What happened that time?"      no 8. CAUSE: "What do you think is causing the abdominal  pain?"     ? diveticulitis (noted on abdominal  CT)  9. RELIEVING/AGGRAVATING FACTORS: "What makes it better or worse?" (e.g., movement, antacids, bowel movement)   n/a 10. OTHER SYMPTOMS: "Has there been any vomiting, diarrhea, constipation, or urine problems?"       diarrhea x 1 week 11. PREGNANCY: "Is there any chance you are pregnant?" "When was your last menstrual period?"      no  Protocols used: ABDOMINAL PAIN - [facility]

## 2017-11-12 NOTE — Telephone Encounter (Signed)
Noted  

## 2017-11-12 NOTE — Progress Notes (Signed)
   Subjective:  Caroline Peters is a 72 y.o. female who presents today with a chief complaint of abdominal Peters.   HPI:  Abdominal Peters Patient seen 8 days ago with abdominal Peters.  Stat abdominal CT was ordered which showed acute diverticulitis and right ovarian cyst.  She was started on ciprofloxacin and Flagyl.  Patient reports that she has completed both these medications and has not noticed any significant improvement in her abdominal Peters.  Peters is mostly located in the lower part of her abdomen however she has some tenderness throughout all her abdomen.  No fevers or chills.  She has had some nausea and vomiting.  She has had several episodes of dark colored diarrhea.  Patient called the triage line this morning was directed to go to the emergency department.  She went there however left AMA after waiting for 4 hours without being seen by provider.  ROS: Per HPI  PMH: She reports that  has never smoked. she has never used smokeless tobacco. She reports that she drinks about 8.4 oz of alcohol per week. She reports that she does not use drugs.   Objective:  Physical Exam: BP 114/70 (BP Location: Right Arm, Patient Position: Sitting, Cuff Size: Normal)   Pulse 61   Temp 97.9 F (36.6 C) (Oral)   Ht 5\' 2"  (1.575 m)   Wt 133 lb (60.3 kg)   SpO2 96%   BMI 24.33 kg/m   Gen: NAD, resting comfortably CV: RRR with no murmurs appreciated Pulm: NWOB, CTAB with no crackles, wheezes, or rhonchi GI: Bowel sounds present.  No deformities.  Tender to palpation diffusely.  No rebound or guarding.  No distention.  KUB: Negative.   Assessment/Plan:  Abdominal Peters/diverticulitis Her abdominal Peters is likely secondary to her diverticulitis.She has mild diffuse tenderness, otherwise her abdominal exam is without peritoneal signs.  Her vitals are stable.  She had blood work done while she was in the emergency department with normal white count and stable hemoglobin.  Given her lack of improvement  on a course of ciprofloxacin and Flagyl, we will start oral Augmentin.  Discussed with patient that next step of therapy would need to be IV antibiotics if she does not improve on Augmentin.  Patient voiced understanding.  Discussed reasons to seek emergent care including severe abdominal Peters, fever, chills, hematochezia, and melena.  Time Spent: I spent 25 minutes face-to-face with the patient, with more than half spent on counseling for treatment and management of her abdominal Peters, plan of care, and reasons to seek emergent care.   Caroline Peters. Caroline Pain, MD 11/12/2017 4:54 PM

## 2017-11-12 NOTE — ED Triage Notes (Signed)
Pt arrived with c/o abdominal pain states she has recently been diagnosed and treated for Diverticulitis. States that she has finished her antibiotics and is still having pain states that the pain is sharp. C/O 1 episode of vomiting and many episodes of black diarrhea.

## 2017-11-12 NOTE — Telephone Encounter (Signed)
See note

## 2017-11-12 NOTE — Telephone Encounter (Signed)
Called and left a voicemail message for patient. I looked back through [place] note but do not see a diet recommended. I left a message for patient but she is currently in the [facility].

## 2017-11-12 NOTE — Telephone Encounter (Signed)
Spoke with Marcene Brawn at [employer] and notified her of encounter; conference call initiated with pt; Marcene Brawn further confirms that based on recommendations per nurse triage the pt should go to the ED for evaluation; the pt verbalizes understanding; Marcene Brawn will also cancel today's 1300 appointment with Dr Dimas Chyle.

## 2017-11-13 NOTE — Telephone Encounter (Signed)
This was taken care of yesterday 3/12 when patient came into the office for appointment with Dr. Jerline Pain.

## 2017-11-20 ENCOUNTER — Encounter: Payer: Self-pay | Admitting: Gynecology

## 2017-11-20 ENCOUNTER — Ambulatory Visit: Payer: Medicare Other | Admitting: Gynecology

## 2017-11-20 VITALS — BP 120/74 | Ht 62.0 in | Wt 134.0 lb

## 2017-11-20 DIAGNOSIS — N83201 Unspecified ovarian cyst, right side: Secondary | ICD-10-CM | POA: Diagnosis not present

## 2017-11-20 NOTE — Patient Instructions (Signed)
Follow up for ultrasound as scheduled 

## 2017-11-20 NOTE — Addendum Note (Signed)
Addended by: Anastasio Auerbach on: 11/20/2017 04:20 PM   Modules accepted: Level of Service

## 2017-11-20 NOTE — Progress Notes (Addendum)
    Caroline Peters [birth year] [id number]        72 y.o.  [id number] presents having not been in the office in several years only being treated for diverticulitis.  CT scan done in evaluation for this showed a 2.1 cm right ovarian cyst.  Uterus normal.  No left pathology.  No free fluid or adenopathy.  Patient's currently on antibiotics for her diverticulitis.  In review of her records she did have a ultrasound 2013 for bleeding where a right ovarian cyst 19 x 17 x 17 mm negative color flow was described.  Past medical history,surgical history, problem list, medications, allergies, family history and social history were all reviewed and documented in the EPIC chart.  Directed ROS with pertinent positives and negatives documented in the history of present illness/assessment and plan.  Exam: Caroline Peters assistant Vitals:   11/20/17 1426  BP: 120/74  Weight: 134 lb (60.8 kg)  Height: 5\' 2"  (1.575 m)   General appearance:  Normal Abdomen soft nontender without masses guarding rebound Pelvic external BUS vagina with atrophic changes.  Cervix with atrophic changes.  Uterus grossly normal midline mobile nontender.  Adnexa without masses or tenderness.  Assessment/Plan:  72 y.o. [id number] with small right ovarian cyst on CT scan recently for evaluation of diverticulitis.  Review of records show roughly 2 cm cyst on the right also noted 2013 on ultrasound.  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  Cannot guarantee not other pathology like newly discovered ovarian cancer but unlikely.  Various scenarios and workups were discussed.  At this point do not recommend doing CA 125 if she is being treated for active diverticulitis as this may cause a false positive potentially.  Options to repeat an ultrasound now for better definition versus not doing anything further in follow-up and assuming it is the same cyst was also discussed  and offered.  At this point the patient's more comfortable with repeating the ultrasound for better definition and if it appears as a small benign-appearing cyst then will stop at this juncture.  Patient will schedule in follow-up for the ultrasound.  Greater than 50% of my 20 minute office visit was spent in direct face to face counseling and coordination of care with the patient.   Caroline Auerbach MD, 3:30 PM 11/20/2017

## 2017-12-10 ENCOUNTER — Ambulatory Visit: Payer: Medicare Other | Admitting: Gynecology

## 2017-12-10 ENCOUNTER — Encounter: Payer: Self-pay | Admitting: Gynecology

## 2017-12-10 ENCOUNTER — Ambulatory Visit (INDEPENDENT_AMBULATORY_CARE_PROVIDER_SITE_OTHER): Payer: Medicare Other

## 2017-12-10 ENCOUNTER — Other Ambulatory Visit: Payer: Self-pay | Admitting: Gynecology

## 2017-12-10 VITALS — BP 118/74

## 2017-12-10 DIAGNOSIS — N83201 Unspecified ovarian cyst, right side: Secondary | ICD-10-CM | POA: Diagnosis not present

## 2017-12-10 DIAGNOSIS — D251 Intramural leiomyoma of uterus: Secondary | ICD-10-CM

## 2017-12-10 NOTE — Progress Notes (Signed)
    Caroline Peters 10/08/1945 [id number]        72 y.o.  G2P2002 presents for ultrasound.  She had a CT scan for her diverticulitis where they noted a right ovarian cyst.  She does have a history of prior right ovarian cyst measured at 19 x 17 x 17 mm in 2013.  Past medical history,surgical history, problem list, medications, allergies, family history and social history were all reviewed and documented in the EPIC chart.  Directed ROS with pertinent positives and negatives documented in the history of present illness/assessment and plan.  Exam: Vitals:   12/10/17 1132  BP: 118/74   General appearance:  Normal  Ultrasound transvaginal shows uterus normal size.  Several small myomas noted at 13 mm, 12 mm, 10 mm.  Endometrial echo 1.5 mm.  Left ovary normal.  Right ovary with thin-walled echo-free 20 x 18 x 18 mm cyst with negative color flow.  Cul-de-sac negative.  Assessment/Plan:  72 y.o. [id number] with small persistent right ovarian cyst unchanged from her 2013 ultrasound.  Reviewed with patient that it has all of the characteristics of a benign cyst to include small size, simple nature, avascular and stable from 6 years prior.  We both agreed to no further follow-up for this.  She does have her annual exam appointment scheduled in the next several weeks and she will follow-up for this.    Anastasio Auerbach MD, 11:45 AM 12/10/2017

## 2017-12-10 NOTE — Patient Instructions (Signed)
Follow-up for your next scheduled appointment

## 2017-12-10 NOTE — Telephone Encounter (Signed)
-----   Message from Vella RaringNakisha D McLean sent at 12/10/2017  9:10 AM EDT -----  Regarding: Dr. Luellen Puckerook/telephone  Patient is scheduled to be seen on 01/30/18 for a diabetes follow up. Pt was told the last time she was seen that she needed to come back in 2 months to schedule a follow up on her blood pressure medication. Pt is requesting a call back to confirm whether or not she needs to come in before her scheduled appointment date. Pt can be reached at [phone].

## 2017-12-10 NOTE — Telephone Encounter (Signed)
Has she been monitoring BP at home? If so, how is her blood pressure readings?    If she does not have a home BP meter, she could come in for a BP only visit with nurse.

## 2017-12-17 ENCOUNTER — Encounter: Payer: Self-pay | Admitting: Gynecology

## 2017-12-17 ENCOUNTER — Ambulatory Visit: Payer: Medicare Other | Admitting: Gynecology

## 2017-12-17 VITALS — BP 120/76 | Ht 62.0 in | Wt 134.0 lb

## 2017-12-17 DIAGNOSIS — M8580 Other specified disorders of bone density and structure, unspecified site: Secondary | ICD-10-CM

## 2017-12-17 DIAGNOSIS — Z01411 Encounter for gynecological examination (general) (routine) with abnormal findings: Secondary | ICD-10-CM | POA: Diagnosis not present

## 2017-12-17 DIAGNOSIS — N83201 Unspecified ovarian cyst, right side: Secondary | ICD-10-CM

## 2017-12-17 DIAGNOSIS — Z124 Encounter for screening for malignant neoplasm of cervix: Secondary | ICD-10-CM

## 2017-12-17 DIAGNOSIS — D259 Leiomyoma of uterus, unspecified: Secondary | ICD-10-CM

## 2017-12-17 DIAGNOSIS — N952 Postmenopausal atrophic vaginitis: Secondary | ICD-10-CM

## 2017-12-17 NOTE — Addendum Note (Signed)
Addended by: Nelva Nay on: 12/17/2017 12:41 PM   Modules accepted: Orders

## 2017-12-17 NOTE — Progress Notes (Signed)
    Caroline Peters 31-Jul-1946 [id number]        71 y.o.  [id number] for breast and pelvic exam.  Several issues noted below.  Past medical history,surgical history, problem list, medications, allergies, family history and social history were all reviewed and documented as reviewed in the EPIC chart.  ROS:  Performed with pertinent positives and negatives included in the history, assessment and plan.   Additional significant findings : None   Exam: [name] assistant Vitals:   12/17/17 1159  BP: 120/76  Weight: 134 lb (60.8 kg)  Height: 5\' 2"  (1.575 m)   Body mass index is 24.51 kg/m.  General appearance:  Normal affect, orientation and appearance. Skin: Grossly normal HEENT: Without gross lesions.  No cervical or supraclavicular adenopathy. Thyroid normal.  Lungs:  Clear without wheezing, rales or rhonchi Cardiac: RR, without RMG Abdominal:  Soft, nontender, without masses, guarding, rebound, organomegaly or hernia Breasts:  Examined lying and sitting without masses, retractions, discharge or axillary adenopathy. Pelvic:  Ext, BUS, Vagina: With atrophic changes  Cervix: With atrophic changes.  Pap smear done  Uterus: Anteverted, normal size, shape and contour, midline and mobile nontender   Adnexa: Without masses or tenderness    Anus and perineum: Normal   Rectovaginal: Normal sphincter tone without palpated masses or tenderness.    Assessment/Plan:  72 y.o. [id number] female for breast and pelvic exam  1. Postmenopausal/atrophic genital changes.  No significant symptoms and no vaginal bleeding. 2. Osteopenia.  DEXA 2014 T score -2.0 FRAX 8.8% / 0.8%.  Recommended follow-up DEXA now at 4-1/2-year interval and patient agrees to schedule. 3. Right ovarian cyst.  CT scan for diverticulitis showed small simple right ovarian cyst.  Follow-up ultrasound 12/2017 confirmed small simple avascular cyst approximately 2 cm.  In review of her records this is stable from a 2013 ultrasound.   We both agree on expectant management with no further intervention unless symptoms. 4. History of small leiomyoma on ultrasound.  Exam is normal. 5. Pap smear 2012.  Pap smear done today.  No history of abnormal Pap smears. 6. Mammography 02/2017.  I reminded the patient it is coming due for her annual mammogram.  Breast exam normal today. 7. Colonoscopy 2018.  Repeat at their recommended interval. 8. Health maintenance.  No routine lab work done as patient does this elsewhere.  Follow-up 1 year, sooner as needed.   Anastasio Auerbach MD, 12:32 PM 12/17/2017

## 2017-12-17 NOTE — Patient Instructions (Signed)
Followup for bone density as scheduled. 

## 2017-12-18 LAB — PAP IG W/ RFLX HPV ASCU

## 2017-12-26 ENCOUNTER — Other Ambulatory Visit: Payer: Self-pay | Admitting: Family Medicine

## 2018-01-06 ENCOUNTER — Encounter: Payer: Self-pay | Admitting: Family Medicine

## 2018-01-06 ENCOUNTER — Ambulatory Visit: Payer: Medicare Other | Admitting: Family Medicine

## 2018-01-06 VITALS — BP 112/76 | HR 67 | Temp 97.9°F | Ht 62.0 in | Wt 134.0 lb

## 2018-01-06 DIAGNOSIS — E785 Hyperlipidemia, unspecified: Secondary | ICD-10-CM

## 2018-01-06 DIAGNOSIS — I10 Essential (primary) hypertension: Secondary | ICD-10-CM | POA: Diagnosis not present

## 2018-01-06 DIAGNOSIS — F3341 Major depressive disorder, recurrent, in partial remission: Secondary | ICD-10-CM | POA: Diagnosis not present

## 2018-01-06 DIAGNOSIS — E039 Hypothyroidism, unspecified: Secondary | ICD-10-CM | POA: Diagnosis not present

## 2018-01-06 LAB — CBC WITH DIFFERENTIAL/PLATELET
BASOS PCT: 1.5 % (ref 0.0–3.0)
Basophils Absolute: 0.1 10*3/uL (ref 0.0–0.1)
EOS ABS: 0.2 10*3/uL (ref 0.0–0.7)
Eosinophils Relative: 4.4 % (ref 0.0–5.0)
HCT: 39.5 % (ref 36.0–46.0)
Hemoglobin: 13.2 g/dL (ref 12.0–15.0)
Lymphocytes Relative: 18.5 % (ref 12.0–46.0)
Lymphs Abs: 0.8 10*3/uL (ref 0.7–4.0)
MCHC: 33.5 g/dL (ref 30.0–36.0)
MCV: 92.5 fl (ref 78.0–100.0)
MONO ABS: 0.4 10*3/uL (ref 0.1–1.0)
Monocytes Relative: 9.4 % (ref 3.0–12.0)
NEUTROS ABS: 2.9 10*3/uL (ref 1.4–7.7)
Neutrophils Relative %: 66.2 % (ref 43.0–77.0)
PLATELETS: 213 10*3/uL (ref 150.0–400.0)
RBC: 4.27 Mil/uL (ref 3.87–5.11)
RDW: 13.8 % (ref 11.5–15.5)
WBC: 4.4 10*3/uL (ref 4.0–10.5)

## 2018-01-06 LAB — LDL CHOLESTEROL, DIRECT: Direct LDL: 82 mg/dL

## 2018-01-06 MED ORDER — SERTRALINE HCL 100 MG PO TABS: 150.0000 mg | ORAL_TABLET | Freq: Every day | ORAL | 2 refills | Status: AC

## 2018-01-06 NOTE — Assessment & Plan Note (Signed)
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  We also discussed Zoloft could potentially be related.  She states she gets hair loss once every few years and it goes back which points towards telogen effluvium.  Has seen dermatology in the past Lab Results  Component Value Date   TSH 0.39 10/09/2017   ROS-has had hair loss A/P: continue current rx

## 2018-01-06 NOTE — Assessment & Plan Note (Signed)
S: controlled on lisinopril alone.  Has had hypercalcemia on hydrochlorothiazide in the past. BP Readings from Last 3 Encounters:  01/06/18 112/76  12/17/17 120/76  12/10/17 118/74  A/P: We discussed blood pressure goal of <140/90. Continue current meds

## 2018-01-06 NOTE — Assessment & Plan Note (Signed)
S: Hopefully controlled on Crestor 20 mg- we increased from 10 mg last visit  She has had some myalgias and was going to try mustard last visit.  We also had discussed possibility of magnesium or once weekly statin Lab Results  Component Value Date   CHOL 203 (H) 10/09/2017   HDL 78.60 10/09/2017   LDLCALC 110 (H) 10/09/2017   LDLDIRECT 85.2 03/03/2013   TRIG 71.0 10/09/2017   CHOLHDL 3 10/09/2017   A/P: We will update direct LDL.  Also checking CBC due to mild leukopenia last visit

## 2018-01-06 NOTE — Progress Notes (Signed)
Subjective:  Caroline Peters is a 72 y.o. year old very pleasant female patient who presents for/with See problem oriented charting ROS- No exertional chest pain. No shortness of breath. No headache or blurry vision.    Past Medical History-  Patient Active Problem List   Diagnosis Date Noted  . Insomnia 06/23/2014    Priority: Medium  . Essential hypertension, benign 03/03/2014    Priority: Medium  . Hyperlipidemia 12/11/2011    Priority: Medium  . Osteopenia     Priority: Medium  . Hypothyroid     Priority: Medium  . 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 History of adenomatous polyp of colon 05/20/2015    Priority: Low  . Hair loss 05/20/2015    Priority: Low  . HA (headache)     Priority: Low  . Melanoma     Priority: Low  . Diverticulitis 11/05/2017  . Ganglion of right wrist 03/27/2017  . Abnormal finding on diagnostic imaging of extremity 02/20/2017  . Adhesive capsulitis of left shoulder 02/13/2017  . 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 ALPRAZolam (XANAX) 0.5 MG tablet TAKE 1/2 TABLET TWICE DAILY AS NEEDED. 30 tablet 1  . Biotin 1000 MCG tablet Take 1,000 mcg by mouth daily.    . calcium carbonate (OSCAL) 1500 (600 Ca) MG TABS tablet Take 1 tablet by mouth daily.    Marland Kitchen levothyroxine (SYNTHROID, LEVOTHROID) 88 MCG tablet TAKE 1 TABLET ONCE DAILY. 90 tablet 3  . lisinopril (PRINIVIL,ZESTRIL) 40 MG tablet TAKE 1 TABLET EACH DAY. 90 tablet 1  . Probiotic Product (PROBIOTIC PO) Take by mouth.    . rosuvastatin (CRESTOR) 20 MG tablet Take 1 tablet (20 mg total) by mouth daily. 90 tablet 3  . sertraline (ZOLOFT) 100 MG tablet Take 1.5 tablets (150 mg total) by mouth daily. 135 tablet 2  . zolpidem (AMBIEN) 5 MG tablet TAKE 1/2 TO 1 TABLET AT BEDTIME AS NEEDED. 30 tablet 5   No current facility-administered medications for  this visit.     Objective: BP 112/76 (BP Location: Left Arm, Patient Position: Sitting, Cuff Size: Normal)   Pulse 67   Temp 97.9 F (36.6 C) (Oral)   Ht 5\' 2"  (1.575 m)   Wt 134 lb (60.8 kg)   SpO2 97%   BMI 24.51 kg/m  Gen: NAD, resting comfortably CV: RRR no murmurs rubs or gallops Lungs: CTAB no crackles, wheeze, rhonchi Abdomen: soft/nontender/nondistended/normal bowel sounds.   Ext: no edema Skin: warm, dry Neuro: Normal gait and speech  Assessment/Plan:   Recurrent major depression in partial remission (HCC) S:  She has been taking her zoloft 100mg  daily- had used every other day previously.   Prior We did discuss some medications like Prozac had a small percentage chance of hair loss.  Last visit her PHQ 9 was 11.  This visit her PHQ 9 is 7. She feels she needs to increase her dose.  She has been having crying spells.  She also uses Ambien for sleep and very sparing Xanax for anxiety.  We discussed the importance of separating these medications by at least 6-8 hours once again.  A/P: increase dose to 150mg . Follow up 6-8 weeks  We also discussed strongly considering counseling with [facility]   Hyperlipidemia S: Hopefully controlled on Crestor 20 mg- we increased from 10 mg last visit  She has had  some myalgias and was going to try mustard last visit.  We also had discussed possibility of magnesium or once weekly statin Lab Results  Component Value Date   CHOL 203 (H) 10/09/2017   HDL 78.60 10/09/2017   LDLCALC 110 (H) 10/09/2017   LDLDIRECT 85.2 03/03/2013   TRIG 71.0 10/09/2017   CHOLHDL 3 10/09/2017   A/P: We will update direct LDL.  Also checking CBC due to mild leukopenia last visit  Essential hypertension, benign S: controlled on lisinopril alone.  Has had hypercalcemia on hydrochlorothiazide in the past. BP Readings from Last 3 Encounters:  01/06/18 112/76  12/17/17 120/76  12/10/17 118/74  A/P: We discussed blood pressure goal of  <140/90. 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  We also discussed Zoloft could potentially be related.  She states she gets hair loss once every few years and it goes back which points towards telogen effluvium.  Has seen dermatology in the past Lab Results  Component Value Date   TSH 0.39 10/09/2017   ROS-has had hair loss A/P: continue current rx   Future Appointments  Date Time Provider Julian  02/18/2018  9:00 AM [facility] BONE DENSITY RM [facility] None   6-8 weeks advised  Lab/Order associations: Hyperlipidemia, unspecified hyperlipidemia type - Plan: Direct LDL, CBC w/Diff  Meds ordered this encounter  Medications  . sertraline (ZOLOFT) 100 MG tablet    Sig: Take 1.5 tablets (150 mg total) by mouth daily.    Dispense:  135 tablet    Refill:  2   Return precautions advised.  Garret Reddish, MD

## 2018-01-06 NOTE — Assessment & Plan Note (Addendum)
S:  She has been taking her zoloft 100mg  daily- had used every other day previously.   Prior We did discuss some medications like Prozac had a small percentage chance of hair loss.  Last visit her PHQ 9 was 11.  This visit her PHQ 9 is 7. She feels she needs to increase her dose.  She has been having crying spells.  She also uses Ambien for sleep and very sparing Xanax for anxiety.  We discussed the importance of separating these medications by at least 6-8 hours once again.  A/P: increase dose to 150mg . Follow up 6-8 weeks  We also discussed strongly considering counseling with [facility]

## 2018-01-06 NOTE — Patient Instructions (Addendum)
Please stop by lab before you go   increase dose to 150mg  of zoloft. Follow up 6-8 weeks  We also discussed strongly considering counseling with [facility]

## 2018-01-07 ENCOUNTER — Telehealth: Payer: Self-pay

## 2018-01-07 NOTE — Telephone Encounter (Signed)
Called patient and gave her lab results. Patient verbalized understanding.

## 2018-01-30 ENCOUNTER — Encounter: Attending: Internal Medicine | Primary: Internal Medicine

## 2018-01-31 ENCOUNTER — Ambulatory Visit
Admit: 2018-01-31 | Discharge: 2018-01-31 | Payer: PRIVATE HEALTH INSURANCE | Attending: Internal Medicine | Primary: Internal Medicine

## 2018-01-31 ENCOUNTER — Ambulatory Visit: Attending: Internal Medicine | Primary: Internal Medicine

## 2018-01-31 DIAGNOSIS — E119 Type 2 diabetes mellitus without complications: Secondary | ICD-10-CM

## 2018-01-31 NOTE — Progress Notes (Signed)
History of Present Illness: Jamie Park is a 72 y.o. female presents for follow-up of diabetes.   he has had diabetes for since 1989.  Most recent A1c was 7.7 in 09/2016, down from 9.4 in 07/2017  A1c was in 6s earlier this month.    Diabetes related complications:  none    Current diabetes regimen:  Metformin 1000 mg twice daily  NPH -15 units twice daily    Ozempic - stopped last Wednesday.  She was told this was not needed    Glucoses:  As low as 88 fasting. Values < 100 feels low to her.   This AM 159.   Other times 120s-130s.       Diet:  Oatmeal or raisin bran      HTN: Currently taking amlodipine, lisinopril and indapamide 2.5.        Dyslipidemia: Prescribed rosuvastatin.  No recent monitoring    Social:  Husband had hip replacement.       Past Medical History:   Diagnosis Date   ??? Breast cancer (HCC) 1988   ??? Diabetes mellitus (HCC)     1989     Current Outpatient Medications   Medication Sig   ??? lisinopril (PRINIVIL, ZESTRIL) 20 mg tablet    ??? rosuvastatin (CRESTOR) 20 mg tablet    ??? Insulin Syringe-Needle U-100 0.3 mL 30 syrg UTD   ??? levothyroxine (SYNTHROID) 75 mcg tablet Take 1 Tab by mouth Daily (before breakfast). For hypothyroidism   ??? insulin NPH (NOVOLIN N, HUMULIN N) 100 unit/mL injection Inject 15 units subcutaneously twice daily   ??? indapamide (LOZOL) 2.5 mg tablet Take 1 Tab by mouth daily. For blood pressure. Replaces HCTZ   ??? TRUE METRIX GLUCOSE TEST STRIP strip    ??? amLODIPine (NORVASC) 5 mg tablet take 1 tablet by mouth once daily   ??? metFORMIN (GLUCOPHAGE) 500 mg tablet Take 2 Tabs by mouth two (2) times daily (with meals).   ??? semaglutide (OZEMPIC) 0.25 mg/0.2 mL (2 mg/1.5 mL) sub-q pen 0.5 mg by SubCUTAneous route every seven (7) days.   ??? semaglutide (OZEMPIC) 1 mg/0.75 mL (2 mg/1.5 mL) sub-q pen 1 mg by SubCUTAneous route every seven (7) days.   ??? benazepril (LOTENSIN) 40 mg tablet take 1 tablet by mouth once daily     No current facility-administered medications for this visit.       Not on File    Review of Systems:  - Eyes: no blurry vision or double vision  - Cardiovascular: no chest pain  - Respiratory: no shortness of breath  - Musculoskeletal: no myalgias  - Neurological: no numbness/tingling in extremities    Physical Examination:  Visit Vitals  BP 159/68   Pulse (!) 58   Ht 5\' 4"  (1.626 m)   Wt 181 lb 4.8 oz (82.2 kg)   BMI 31.12 kg/m??   -   - General: pleasant, no distress, normal gait   HEENT: hearing intact, EOMI, clear sclera without icterus  - Cardiovascular: regular, normal rate   - Respiratory: normal effort  - Integumentary: no edema   Diabetic foot exam:     Left Foot:   Visual Exam: normal    Pulse DP: 2+ (normal)   Filament test: normal sensation    Vibratory sensation: normal      Right Foot:   Visual Exam: normal    Pulse DP: 2+ (normal)   Filament test: normal sensation        - Psychiatric: normal mood  and affect    Data Reviewed:   Lab Results   Component Value Date/Time    Hemoglobin A1c 9.4 08/02/2017 03:21 PM    Hemoglobin A1c, External 7.7 09/17/2017      Lab Results   Component Value Date/Time    Sodium 141 08/02/2017 03:21 PM    Potassium 4.4 08/02/2017 03:21 PM    Creatinine 0.94 08/02/2017 03:21 PM    Creatinine, External 0.92 09/17/2017        Lab Results   Component Value Date/Time    LDL-C, External 52 09/17/2017      Lab Results   Component Value Date/Time    TSH 10.460 08/02/2017 03:21 PM    T4, Free 1.14 08/02/2017 03:21 PM    Thyroid peroxidase Ab 11 05/03/2017 04:02 PM        Assessment/Plan:   1. Diabetes mellitus without complication (HCC)   Control is improved.  Continue metformin  Would recommend as little NPH as possible.  As she is having some low values, recommend decreasing this to 10 units twice daily.  Triglycerides less than 90 or 100.  Ozempic??? recommended this in addition to the metformin and insulin.  If this works very well, it may replace the insulin   2. Acquired hypothyroidism   TSH was at goal on 09/2017.   3. HTN (hypertension),  benign   Continue amlodipine, lisinopril, indapamide.  Blood pressure uncontrolled     4. Dyslipidemia -continue rosuvastatin   5. BMI 31.0-31.9,adult    .      Encouraged dietary efforts and exercise.  6.      Constipation: -Recommended ensuring adequate hydration.  Suggested she could do a trial of magnesium oxide???500 to 1000 mg at bedtime.  Patient Instructions   Diabetes.  Continue dietary efforts and weight loss.     Medications:  Continue metformin to 1000 mg twice daily    Recommend Ozempic 0.5 mg once weekly  Prescription for 1.0 mg pen - take 1/2 dose/ 0.5 mg weekly - I believe this is 38 clicks = 0.5 mg weekly.     NPH - continue 10-15 units twice daily  Lower doses for values < 100 if they are occurring often.     Goals for blood glucose:  Fasting 80-130 (less than 150)  Lunch, Dinner, Bedtime - 80-150 (less than 180).    Blood pressure:  Continue indapamide 2.5 mg daily  Continue amlodipine 5 mg + lisinopril     Constipation:  - drink plenty of water  - Try magnesium oxide 500 mg-1000 mg at bedtime - adjust for regular bowel movements.     Follow-up and Dispositions    ?? Return in about 4 months (around 06/02/2018).           Copy sent to:

## 2018-01-31 NOTE — Progress Notes (Signed)
History of Present Illness: Jamie Park is a 72 y.o. female presents for follow-up of diabetes.   he has had diabetes for since 1989.  Most recent A1c was 7.7 in 09/2016, down from 9.4 in 07/2017  A1c was in 6s earlier this month.    Diabetes related complications:  none    Current diabetes regimen:  Metformin 1000 mg twice daily  NPH -15 units twice daily    Ozempic - stopped last Wednesday.  She was told this was not needed    Glucoses:  As low as 88 fasting. Values < 100 feels low to her.   This AM 159.   Other times 120s-130s.       Diet:  Oatmeal or raisin bran      HTN: Currently taking amlodipine, lisinopril and indapamide 2.5.        Dyslipidemia: Prescribed rosuvastatin.  No recent monitoring    Social:  Husband had hip replacement.       Past Medical History:   Diagnosis Date   ??? Breast cancer (HCC) 1988   ??? Diabetes mellitus (HCC)     1989     Current Outpatient Medications   Medication Sig   ??? lisinopril (PRINIVIL, ZESTRIL) 20 mg tablet    ??? rosuvastatin (CRESTOR) 20 mg tablet    ??? Insulin Syringe-Needle U-100 0.3 mL 30 syrg UTD   ??? levothyroxine (SYNTHROID) 75 mcg tablet Take 1 Tab by mouth Daily (before breakfast). For hypothyroidism   ??? insulin NPH (NOVOLIN N, HUMULIN N) 100 unit/mL injection Inject 15 units subcutaneously twice daily   ??? indapamide (LOZOL) 2.5 mg tablet Take 1 Tab by mouth daily. For blood pressure. Replaces HCTZ   ??? TRUE METRIX GLUCOSE TEST STRIP strip    ??? amLODIPine (NORVASC) 5 mg tablet take 1 tablet by mouth once daily   ??? metFORMIN (GLUCOPHAGE) 500 mg tablet Take 2 Tabs by mouth two (2) times daily (with meals).   ??? semaglutide (OZEMPIC) 0.25 mg/0.2 mL (2 mg/1.5 mL) sub-q pen 0.5 mg by SubCUTAneous route every seven (7) days.   ??? semaglutide (OZEMPIC) 1 mg/0.75 mL (2 mg/1.5 mL) sub-q pen 1 mg by SubCUTAneous route every seven (7) days.   ??? benazepril (LOTENSIN) 40 mg tablet take 1 tablet by mouth once daily     No current facility-administered medications for this visit.       Not on File    Review of Systems:  - Eyes: no blurry vision or double vision  - Cardiovascular: no chest pain  - Respiratory: no shortness of breath  - Musculoskeletal: no myalgias  - Neurological: no numbness/tingling in extremities    Physical Examination:  Visit Vitals  BP 159/68   Pulse (!) 58   Ht 5\' 4"  (1.626 m)   Wt 181 lb 4.8 oz (82.2 kg)   BMI 31.12 kg/m??   -   - General: pleasant, no distress, normal gait   HEENT: hearing intact, EOMI, clear sclera without icterus  - Cardiovascular: regular, normal rate   - Respiratory: normal effort  - Integumentary: no edema   Diabetic foot exam:     Left Foot:   Visual Exam: normal    Pulse DP: 2+ (normal)   Filament test: normal sensation    Vibratory sensation: normal      Right Foot:   Visual Exam: normal    Pulse DP: 2+ (normal)   Filament test: normal sensation        - Psychiatric: normal mood  and affect    Data Reviewed:   Lab Results   Component Value Date/Time    Hemoglobin A1c 9.4 08/02/2017 03:21 PM    Hemoglobin A1c, External 7.7 09/17/2017      Lab Results   Component Value Date/Time    Sodium 141 08/02/2017 03:21 PM    Potassium 4.4 08/02/2017 03:21 PM    Creatinine 0.94 08/02/2017 03:21 PM    Creatinine, External 0.92 09/17/2017        Lab Results   Component Value Date/Time    LDL-C, External 52 09/17/2017      Lab Results   Component Value Date/Time    TSH 10.460 08/02/2017 03:21 PM    T4, Free 1.14 08/02/2017 03:21 PM    Thyroid peroxidase Ab 11 05/03/2017 04:02 PM        Assessment/Plan:   1. Diabetes mellitus without complication (HCC)   Control is improved.  Continue metformin  Would recommend as little NPH as possible.  As she is having some low values, recommend decreasing this to 10 units twice daily.  Triglycerides less than 90 or 100.  Ozempic??? recommended this in addition to the metformin and insulin.  If this works very well, it may replace the insulin   2. Acquired hypothyroidism   TSH was at goal on 09/2017.   3. HTN (hypertension), benign    Continue amlodipine, lisinopril, indapamide.  Blood pressure uncontrolled     4. Dyslipidemia -continue rosuvastatin   5. BMI 31.0-31.9,adult    .      Encouraged dietary efforts and exercise.  6.      Constipation: -Recommended ensuring adequate hydration.  Suggested she could do a trial of magnesium oxide???500 to 1000 mg at bedtime.  Patient Instructions   Diabetes.  Continue dietary efforts and weight loss.     Medications:  Continue metformin to 1000 mg twice daily    Recommend Ozempic 0.5 mg once weekly  Prescription for 1.0 mg pen - take 1/2 dose/ 0.5 mg weekly - I believe this is 38 clicks = 0.5 mg weekly.     NPH - continue 10-15 units twice daily  Lower doses for values < 100 if they are occurring often.     Goals for blood glucose:  Fasting 80-130 (less than 150)  Lunch, Dinner, Bedtime - 80-150 (less than 180).    Blood pressure:  Continue indapamide 2.5 mg daily  Continue amlodipine 5 mg + lisinopril     Constipation:  - drink plenty of water  - Try magnesium oxide 500 mg-1000 mg at bedtime - adjust for regular bowel movements.     Follow-up and Dispositions    ?? Return in about 4 months (around 06/02/2018).           Copy sent to:

## 2018-01-31 NOTE — Patient Instructions (Signed)
Diabetes.  Continue dietary efforts and weight loss.     Medications:  Continue metformin to 1000 mg twice daily    Recommend Ozempic 0.5 mg once weekly  Prescription for 1.0 mg pen - take 1/2 dose/ 0.5 mg weekly - I believe this is 38 clicks = 0.5 mg weekly.     NPH - continue 10-15 units twice daily  Lower doses for values < 100 if they are occurring often.     Goals for blood glucose:  Fasting 80-130 (less than 150)  Lunch, Dinner, Bedtime - 80-150 (less than 180).    Blood pressure:  Continue indapamide 2.5 mg daily  Continue amlodipine 5 mg + lisinopril     Constipation:  - drink plenty of water  - Try magnesium oxide 500 mg-1000 mg at bedtime - adjust for regular bowel movements.

## 2018-02-07 ENCOUNTER — Other Ambulatory Visit: Payer: Self-pay | Admitting: Family Medicine

## 2018-02-18 ENCOUNTER — Ambulatory Visit (INDEPENDENT_AMBULATORY_CARE_PROVIDER_SITE_OTHER): Payer: Medicare Other

## 2018-02-18 ENCOUNTER — Other Ambulatory Visit: Payer: Self-pay | Admitting: Gynecology

## 2018-02-18 DIAGNOSIS — M8589 Other specified disorders of bone density and structure, multiple sites: Secondary | ICD-10-CM | POA: Diagnosis not present

## 2018-02-18 DIAGNOSIS — M8580 Other specified disorders of bone density and structure, unspecified site: Secondary | ICD-10-CM

## 2018-02-18 DIAGNOSIS — Z780 Asymptomatic menopausal state: Secondary | ICD-10-CM | POA: Diagnosis not present

## 2018-02-19 ENCOUNTER — Encounter: Payer: Self-pay | Admitting: Gynecology

## 2018-02-20 ENCOUNTER — Ambulatory Visit: Payer: Medicare Other | Admitting: Family Medicine

## 2018-02-20 ENCOUNTER — Encounter: Payer: Self-pay | Admitting: Family Medicine

## 2018-02-20 VITALS — BP 110/54 | HR 68 | Temp 98.1°F | Wt 136.2 lb

## 2018-02-20 DIAGNOSIS — J0100 Acute maxillary sinusitis, unspecified: Secondary | ICD-10-CM | POA: Diagnosis not present

## 2018-02-20 MED ORDER — BENZONATATE 100 MG PO CAPS: 100.0000 mg | ORAL_CAPSULE | Freq: Three times a day (TID) | ORAL | 0 refills | Status: DC | PRN

## 2018-02-20 MED ORDER — GUAIFENESIN-CODEINE 100-10 MG/5ML PO SOLN: 10.0000 mL | Freq: Two times a day (BID) | ORAL | 0 refills | Status: DC | PRN

## 2018-02-20 MED ORDER — AMOXICILLIN-POT CLAVULANATE 875-125 MG PO TABS: 1.0000 | ORAL_TABLET | Freq: Two times a day (BID) | ORAL | 0 refills | Status: DC

## 2018-02-20 NOTE — Progress Notes (Signed)
Patient: Caroline Peters MRN: [id number] DOB: 01/04/1946 PCP: Marin Olp, MD     Subjective:  Chief Complaint  Patient presents with  . Sore Throat    symptoms x 4 days  . Cough    HPI: The patient is a 72 y.o. female who presents today for sore throat and cough x 4 days. She woke up in the middle of the night 4 days ago with the worst sore throat and dry cough constantly. Her nose has been running constantly. She has a productive cough now as well. Color is mucous looking. Light green in nature. The sore throat has gotten better and comes and goes. She has taken cherry tussin otc and it doesn't touch it. She has taken advil as well. She had fever to 100, but is unsure if she had fever before this. She has had no sick contacts that she is aware of. She does not have asthma, copd or hx of smoking.  Review of Systems  Constitutional: Positive for chills and fever.  HENT: Positive for congestion, sore throat and trouble swallowing. Negative for ear pain, sinus pressure and sinus pain.   Respiratory: Positive for cough. Negative for chest tightness, shortness of breath and wheezing.   Cardiovascular: Negative for chest pain.  Gastrointestinal: Negative for nausea and vomiting.  Endocrine: Negative for cold intolerance and heat intolerance.  Neurological: Positive for headaches. Negative for dizziness.  Psychiatric/Behavioral: Negative for agitation. The patient is not nervous/anxious.     Allergies Patient is allergic to compazine and prednisone.  Past Medical History Patient  has a past medical history of Anxiety, Cancer ([place]), DEPRESSION (04/16/2007), Fibroadenoma of breast, HA (headache), Hyperlipidemia, Hypertension, Hypothyroid, HYPOTHYROIDISM (04/16/2007), Kidney stone, and Osteopenia (02/2018).  Surgical History Patient  has a past surgical history that includes EXCISION OF MELANOMA (1978); Hysteroscopy (1988); Breast surgery; Mass excision (Right, 02/23/2014); Colonoscopy;  Polypectomy; laparoscopic appendectomy (N/A, 11/10/2015); Laparoscopic ileocecectomy (N/A, 11/10/2015); and Tonsillectomy.  Family History Pateint's family history includes Breast cancer in her maternal grandmother; Cancer in her father; Colon cancer in her other; Dementia in her mother; Diabetes in her father and mother; Heart disease (age of onset: 79) in her father; Heart disease (age of onset: 64) in her mother.  Social History Patient  reports that she has never smoked. She has never used smokeless tobacco. She reports that she drinks about 8.4 oz of alcohol per week. She reports that she does not use drugs.    Objective: Vitals:   02/20/18 0824  BP: (!) 110/54  Pulse: 68  Temp: 98.1 F (36.7 C)  TempSrc: Oral  SpO2: 95%  Weight: 136 lb 3.2 oz (61.8 kg)    Body mass index is 24.91 kg/m.  Physical Exam  Constitutional: She is oriented to person, place, and time. She appears well-developed and well-nourished.  HENT:  Right Ear: Tympanic membrane, external ear and ear canal normal.  Left Ear: Tympanic membrane, external ear and ear canal normal.  Mouth/Throat: Oropharynx is clear and moist and mucous membranes are normal. No oropharyngeal exudate. No tonsillar exudate.  TTP over maxillary sinuses bilaterally and mildly tender on frontal sinus   Eyes: Pupils are equal, round, and reactive to light. Conjunctivae and EOM are normal.  Neck: Normal range of motion. Neck supple. No thyromegaly present.  Cardiovascular: Normal rate, regular rhythm, normal heart sounds and intact distal pulses.  No murmur heard. Pulmonary/Chest: Effort normal and breath sounds normal.  Abdominal: Soft. Bowel sounds are normal. She exhibits no distension. There  is no tenderness.  Lymphadenopathy:    She has no cervical adenopathy.  Neurological: She is alert and oriented to person, place, and time. She displays normal reflexes. No cranial nerve deficit. Coordination normal.  Skin: Skin is warm and dry. No  rash noted.  Psychiatric: She has a normal mood and affect. Her behavior is normal.  Vitals reviewed.      Assessment/plan: 1. Acute maxillary sinusitis, recurrence not specified 10 day course of augmentin, start flonase and cool mist humidifier. Side effects of medication discussed and instructed on how to properly use flonase. Recommended probiotic with augmentin. Also discussed she could do honey for cough and will send in tessalon pearls to try prn and cough syrup, but discussed I feel her cough is more related to post nasal drip from her sinuses. Let us know if not getting better.     Return if symptoms worsen or fail to improve.     Orma Flaming, MD [place]  02/20/2018

## 2018-02-27 DIAGNOSIS — Z1231 Encounter for screening mammogram for malignant neoplasm of breast: Secondary | ICD-10-CM | POA: Diagnosis not present

## 2018-02-27 LAB — HM MAMMOGRAPHY

## 2018-02-28 ENCOUNTER — Encounter: Payer: Self-pay | Admitting: Family Medicine

## 2018-03-03 ENCOUNTER — Encounter: Payer: Self-pay | Admitting: Family Medicine

## 2018-03-03 ENCOUNTER — Ambulatory Visit: Payer: Medicare Other | Admitting: Family Medicine

## 2018-03-03 VITALS — BP 114/62 | HR 60 | Temp 98.3°F | Ht 62.0 in | Wt 135.4 lb

## 2018-03-03 DIAGNOSIS — E039 Hypothyroidism, unspecified: Secondary | ICD-10-CM | POA: Diagnosis not present

## 2018-03-03 DIAGNOSIS — F3341 Major depressive disorder, recurrent, in partial remission: Secondary | ICD-10-CM

## 2018-03-03 LAB — TSH: TSH: 4.25 u[IU]/mL (ref 0.35–4.50)

## 2018-03-03 NOTE — Assessment & Plan Note (Signed)
S: PHQ9 of 7 today stable from 7 last visit. We increased her zoloft to 150mg  from 100mg  last visit with plan to follow up 6-8 weeks.   She increased for 2-3 weeks and felt "funny". Slightly sick on stomach  She continues ambien for sleep and xanax for anxiety. She has been told to separate these by at least 6-8 hours.  A/P: retrial 150mg  zoloft with 6-8 week repeat. Discussed side effects in first few weeks and hopeful improvement within 6-8 week timeframe. Recheck phq9 at follow up. Encouraged counseling again. Consider alternate medication like lexapro

## 2018-03-03 NOTE — Assessment & Plan Note (Signed)
S: On thyroid medication-she is on 88 mcg. Has had some nail changes recently. Continues to have the hair issues- falling out.   Lab Results  Component Value Date   TSH 0.39 10/09/2017   A/P: we will update TSH to make sure it is not off with nail issues and continued depressed mood (though she did not keep up with consistent use of zoloft 150mg  due to SE)

## 2018-03-03 NOTE — Progress Notes (Signed)
Subjective:  Caroline Peters is a 72 y.o. year old very pleasant female patient who presents for/with See problem oriented charting ROS- No chest pain or shortness of breath. No headache or blurry vision. Continues with some mild anhedonia and depressed mood   Past Medical History-  Patient Active Problem List   Diagnosis Date Noted  . Insomnia 06/23/2014    Priority: Medium  . Essential hypertension, benign 03/03/2014    Priority: Medium  . Hyperlipidemia 12/11/2011    Priority: Medium  . Osteopenia     Priority: Medium  . Hypothyroid     Priority: Medium  . 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 History of adenomatous polyp of colon 05/20/2015    Priority: Low  . Hair loss 05/20/2015    Priority: Low  . HA (headache)     Priority: Low  . Melanoma     Priority: Low  . Diverticulitis 11/05/2017  . Ganglion of right wrist 03/27/2017  . Abnormal finding on diagnostic imaging of extremity 02/20/2017  . Adhesive capsulitis of left shoulder 02/13/2017  . 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 ALPRAZolam (XANAX) 0.5 MG tablet TAKE 1/2 TABLET TWICE DAILY AS NEEDED. 30 tablet 1  . aspirin 325 MG tablet Take 325 mg by mouth daily.    . Biotin 1000 MCG tablet Take 1,000 mcg by mouth daily.    . calcium carbonate (OSCAL) 1500 (600 Ca) MG TABS tablet Take 1 tablet by mouth daily.    Marland Kitchen guaiFENesin-codeine 100-10 MG/5ML syrup Take 10 mLs by mouth 2 (two) times daily as needed for cough. Caution drowsiness 120 mL 0  . ibuprofen (ADVIL,MOTRIN) 200 MG tablet Take 200 mg by mouth every 6 (six) hours as needed.    Marland Kitchen levothyroxine (SYNTHROID, LEVOTHROID) 88 MCG tablet TAKE 1 TABLET ONCE DAILY. 90 tablet 3  . lisinopril (PRINIVIL,ZESTRIL) 40 MG tablet TAKE 1 TABLET EACH DAY. 90 tablet 1  . rosuvastatin (CRESTOR) 20 MG tablet Take 1 tablet (20 mg  total) by mouth daily. 90 tablet 3  . sertraline (ZOLOFT) 100 MG tablet Take 1.5 tablets (150 mg total) by mouth daily. 135 tablet 2  . zolpidem (AMBIEN) 5 MG tablet TAKE 1/2 TO 1 TABLET AT BEDTIME AS NEEDED. 30 tablet 5   No current facility-administered medications for this visit.     Objective: BP 114/62 (BP Location: Left Arm, Patient Position: Sitting, Cuff Size: Large)   Pulse 60   Temp 98.3 F (36.8 C) (Oral)   Ht 5\' 2"  (1.575 m)   Wt 135 lb 6.4 oz (61.4 kg)   SpO2 97%   BMI 24.76 kg/m  Gen: NAD, resting comfortably CV: RRR no murmurs rubs or gallops Lungs: CTAB no crackles, wheeze, rhonchi Abdomen: soft/nontender/nondistended/normal bowel sounds. No rebound or guarding.  Ext: no edema Skin: warm, dry, some splitting at ends of fingernails.   Assessment/Plan:  Other notes: 1.some splitting of nails horizontally for last month 2. Heard pop in right shoulder when woke up. Arm felt stuck in a position. Went away within a few seconds.    Hypothyroid S: On thyroid medication-she is on 88 mcg. Has had some nail changes recently. Continues to have the hair issues- falling out.   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 We increased her zoloft to 150mg  from 100mg  last visit with plan to follow up 6-8 weeks.   She increased for 2-3 weeks and felt "funny". Slightly sick on stomach  She continues ambien for sleep and xanax for anxiety. She has been told to separate these by at least 6-8 hours.  A/P: retrial 150mg  zoloft with 6-8 week repeat. Discussed side effects in first few weeks and hopeful improvement within 6-8 week timeframe. Recheck phq9 at follow up. Encouraged counseling again.  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  Garret Reddish, MD

## 2018-03-03 NOTE — Progress Notes (Signed)
Thyroid is normal but it is in high end of normal which is pretty different than where she usually lives at around 1 or less. Lets plan on repeating this potentially at next visit.

## 2018-03-03 NOTE — Patient Instructions (Signed)
Increase zoloft to 150mg  for consistent 6 weeks at least- then come in before making adjustments and lets check another phq9 test  Please stop by lab before you go  Taking the medicine as directed and not missing any doses is one of the best things you can do to treat your depression.  Here are some things to keep in mind:  1) Side effects (stomach upset, some increased anxiety) may happen before you notice a benefit.  These side effects typically go away over time. 2) Changes to your dose of medicine or a change in medication all together is sometimes necessary 3) Most people need to be on medication at least 6-12 months 4) Many people will notice an improvement within two weeks but the full effect of the medication can take up to 4-6 weeks 5) Stopping the medication when you start feeling better often results in a return of symptoms 6) If you start having thoughts of hurting yourself or others after starting this medicine, call our office immediately at [phone] or seek care through 911.

## 2018-04-22 ENCOUNTER — Ambulatory Visit: Payer: Medicare Other | Admitting: Family Medicine

## 2018-04-24 ENCOUNTER — Encounter: Payer: Self-pay | Admitting: Family Medicine

## 2018-04-24 ENCOUNTER — Ambulatory Visit: Payer: Medicare Other | Admitting: Family Medicine

## 2018-04-24 VITALS — BP 122/64 | HR 63 | Temp 98.7°F | Ht 62.0 in | Wt 136.2 lb

## 2018-04-24 DIAGNOSIS — G4700 Insomnia, unspecified: Secondary | ICD-10-CM

## 2018-04-24 DIAGNOSIS — F3341 Major depressive disorder, recurrent, in partial remission: Secondary | ICD-10-CM

## 2018-04-24 DIAGNOSIS — E039 Hypothyroidism, unspecified: Secondary | ICD-10-CM

## 2018-04-24 NOTE — Progress Notes (Signed)
Subjective:  Caroline Peters is a 72 y.o. year old very pleasant female patient who presents for/with See problem oriented charting ROS- no depressed mood, anhedonia. Some anxiety. No SI   Past Medical History-  Patient Active Problem List   Diagnosis Date Noted  . Insomnia 06/23/2014    Priority: Medium  . Essential hypertension, benign 03/03/2014    Priority: Medium  . Hyperlipidemia 12/11/2011    Priority: Medium  . Osteopenia     Priority: Medium  . Hypothyroid     Priority: Medium  . 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 History of adenomatous polyp of colon 05/20/2015    Priority: Low  . Hair loss 05/20/2015    Priority: Low  . HA (headache)     Priority: Low  . Melanoma     Priority: Low  . Diverticulitis 11/05/2017  . Ganglion of right wrist 03/27/2017  . Abnormal finding on diagnostic imaging of extremity 02/20/2017  . Adhesive capsulitis of left shoulder 02/13/2017  . 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 ALPRAZolam (XANAX) 0.5 MG tablet TAKE 1/2 TABLET TWICE DAILY AS NEEDED. 30 tablet 1  . Biotin 1000 MCG tablet Take 1,000 mcg by mouth daily.    . calcium carbonate (OSCAL) 1500 (600 Ca) MG TABS tablet Take 1 tablet by mouth daily.    Marland Kitchen levothyroxine (SYNTHROID, LEVOTHROID) 88 MCG tablet TAKE 1 TABLET ONCE DAILY. 90 tablet 3  . lisinopril (PRINIVIL,ZESTRIL) 40 MG tablet TAKE 1 TABLET EACH DAY. 90 tablet 1  . rosuvastatin (CRESTOR) 20 MG tablet Take 1 tablet (20 mg total) by mouth daily. 90 tablet 3  . sertraline (ZOLOFT) 100 MG tablet Take 1.5 tablets (150 mg total) by mouth daily. 135 tablet 2  . zolpidem (AMBIEN) 5 MG tablet TAKE 1/2 TO 1 TABLET AT BEDTIME AS NEEDED. 30 tablet 5   No current facility-administered medications for this visit.     Objective: BP 122/64 (BP Location: Left Arm, Patient Position:  Sitting, Cuff Size: Large)   Pulse 63   Temp 98.7 F (37.1 C) (Oral)   Ht 5\' 2"  (1.575 m)   Wt 136 lb 3.2 oz (61.8 kg)   SpO2 97%   BMI 24.91 kg/m  Gen: NAD, resting comfortably CV: RRR no murmurs rubs or gallops Lungs: CTAB no crackles, wheeze, rhonchi Abdomen: soft/nontender/nondistended/normal bowel sounds. Ext: no edema Skin: warm, dry Neuro: speech normal, moves all extremities  Assessment/Plan:  Other notes: 1.she thinks she is taking biotin OTC for hair nails- has had long term issues with hair falling out. Some nail splitting horizontally.   2. Shoulder now better 3. She wants to recheck TSH with future labs as was trending higher than usual for her.  4. 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 She states minimal side effects  ambien for sleep. Very sparing xanax once every 2 months. She always separates these by over 8 hours because dose xanax in AM and ambien in PM A/P: trhilled with progress with PHQ9 of 0 nad not difficult at all- she will remain on her current medications. We also discussed possibility of EMDR counseling to help with prior traumas. Continue current meds including daily zoloft 150mg , sparing  xanax, nightly ambien  At wellness visit please advise 6 month physical with me or sooner if needed   Future Appointments  Date Time Provider [place]  05/01/2018 11:00 AM [facility]    Return precautions advised.  Garret Reddish, MD

## 2018-04-24 NOTE — Assessment & Plan Note (Signed)
Levothyroxine 132mcg--> 88 mcg. TSH trending up- she declines repeat today but we can do with next labs.

## 2018-04-24 NOTE — Patient Instructions (Addendum)
Health Maintenance Due  Topic Date Due  . INFLUENZA VACCINE -schedule for this Fall 04/03/2018   Consider 800 to 1000 units a day of vitamin D  Thrilled depression is doing better  I would also like for you to sign up for an annual wellness visit with one of our nurses, Cassie or Manuela Schwartz or Roselyn Reef, who both specialize in the annual wellness visit. This is a free benefit under medicare that may help [place] find additional ways to help you. Some highlights are reviewing medications, lifestyle, and doing a dementia screen.

## 2018-04-24 NOTE — Assessment & Plan Note (Addendum)
S: depression much improved with persistent use of 150mg  zoloft daily. She states minimal side effects  ambien for sleep. Very sparing xanax once every 2 months. She always separates these by over 8 hours because dose xanax in AM and ambien in PM A/P: trhilled with progress with PHQ9 of 0 nad not difficult at all- she will remain on her current medications. We also discussed possibility of EMDR counseling to help with prior traumas. Continue current meds including daily zoloft 150mg , sparing xanax, nightly ambien

## 2018-04-24 NOTE — Telephone Encounter (Signed)
04/24/2018  7:58 AM      Please see message from answering service regarding a call back.            Thanks

## 2018-04-24 NOTE — Telephone Encounter (Signed)
-----   Message from Tyrell Antonioonya Y White sent at 04/24/2018  7:53 AM EDT -----  Regarding: Dr. Luellen Puckerook/Telephone  Patient's PCP ,[facility], called to speak with Dr Adriana Simasook regarding patient care. Please return call on her cell phone [phone].

## 2018-04-25 LAB — AMB EXT LDL-C
LDL-C, External: 119
LDL-C, External: 119 NA

## 2018-04-25 LAB — AMB EXT HGBA1C
Hemoglobin A1C, External: 6.7 NA
Hemoglobin A1c, External: 6.7

## 2018-04-25 NOTE — Telephone Encounter (Signed)
Called her back. See other telephone call dated 04/25/2018

## 2018-04-25 NOTE — Telephone Encounter (Signed)
04/25/2018  1:21 PM    Patient PCP Dr. Nona DellBhat called in wanting a call back from Dr. Adriana Simasook in regards to patient coming off of medication Ozempic because it is to expensive. She stated that the patients A1C was 6.7 as of yesterday. She would like a call back to her cell phone at [phone].      Thanks

## 2018-04-25 NOTE — Telephone Encounter (Signed)
We discussed cost of Ozempic  Dr Nona DellBhat discussed that adherence to metformin was not very good. Mrs Jamie Park is rarely taking NPH.    If diabetes could be controlled with metformin alone,  I would be pleased with that    Can provide Ozempic if additional agents are needed.

## 2018-05-01 ENCOUNTER — Ambulatory Visit (INDEPENDENT_AMBULATORY_CARE_PROVIDER_SITE_OTHER): Payer: Medicare Other

## 2018-05-01 VITALS — BP 120/60 | HR 64 | Ht 62.0 in | Wt 136.1 lb

## 2018-05-01 DIAGNOSIS — Z0000 Encounter for general adult medical examination without abnormal findings: Secondary | ICD-10-CM

## 2018-05-01 NOTE — Progress Notes (Signed)
I have reviewed and agree with note, evaluation, plan.   Stephen Hunter, MD  

## 2018-05-01 NOTE — Progress Notes (Signed)
Subjective:   Caroline Peters is a 72 y.o. female who presents for Medicare Annual (Subsequent) preventive examination.  Reports health as good Not as sharp as "I used to be"  Discussed memory issues   Twins 73 yo   Diet Chol/hdl 396 hdl 78  BMI 24.    Exercise Full time real estate;    ETOH 2 glasses per day  Health Maintenance Due  Topic Date Due  . INFLUENZA VACCINE  04/03/2018    Will take the flu vaccine in October   Colonoscopy 01/2017 - no more planned  Mammogram 02/2018  dexa 02/2018 osteopenic but no scores Has GYN  Will review osteoporosis [email or web address]   Educated regarding the shingrix         Objective:     Vitals: BP 120/60   Pulse 64   Ht 5\' 2"  (1.575 m)   Wt 136 lb 2 oz (61.7 kg)   SpO2 96%   BMI 24.90 kg/m   Body mass index is 24.9 kg/m.  Advanced Directives 05/01/2018 11/12/2017 01/03/2017 11/10/2015 11/03/2015 09/19/2015  Does Patient Have a Medical Advance Directive? Yes No Yes Yes Yes Yes  Type of Advance Directive - - [place];Living will [place];Living will [place];Living will Living will;Healthcare Power of Attorney  Does patient want to make changes to medical advance directive? - - - No - Patient declined No - Patient declined -  Copy of [place] in Chart? - - - (No Data) No - copy requested -    Tobacco Social History   Tobacco Use  Smoking Status Never Smoker  Smokeless Tobacco Never Used     Counseling given: Yes   Clinical Intake:     Past Medical History:  Diagnosis Date  . Anxiety   . Cancer ([place])    Melanoma- on arm age 41   . DEPRESSION 04/16/2007  . Fibroadenoma of breast   . HA (headache)    hx of   . Hyperlipidemia   . Hypertension   . Hypothyroid   . HYPOTHYROIDISM 04/16/2007  . Kidney stone   . Osteopenia 02/2018   T score -1.9 FRAX 10% / 1.4% stable from prior DEXA.   Past Surgical History:  Procedure Laterality Date    . BREAST SURGERY     bx left breast- and removal of fibroadenoma  . COLONOSCOPY    . EXCISION OF MELANOMA  1978  . HYSTEROSCOPY  1988   HYSTEROSCOPY,D&C  . LAPAROSCOPIC APPENDECTOMY N/A 11/10/2015   Procedure: APPENDECTOMY LAPAROSCOPIC W PARTIAL CECECTOMY ;  Surgeon: Jackolyn Confer, MD;  Location: [facility];  Service: General;  Laterality: N/A;  . LAPAROSCOPIC ILEOCECECTOMY N/A 11/10/2015   Procedure: LAPAROSCOPIC ILEOCECECTOMY;  Surgeon: Jackolyn Confer, MD;  Location: [facility];  Service: General;  Laterality: N/A;  . MASS EXCISION Right 02/23/2014   Procedure: BIOPSY TEMPORAL ARTERY RIGHT;  Surgeon: Rozetta Nunnery, MD;  Location: [facility];  Service: ENT;  Laterality: Right;  . POLYPECTOMY    . TONSILLECTOMY     Family History  Problem Relation Age of Onset  . Diabetes Mother   . Heart disease Mother 33  . Dementia Mother   . Diabetes Father   . Heart disease Father 58  . Cancer Father        Skin cancer-led to death  . Breast cancer Maternal Grandmother        Age 31  . Colon cancer Other  first cousin dx'd in his 48's  . Stomach cancer Neg Hx   . Colon polyps Neg Hx   . Esophageal cancer Neg Hx   . Rectal cancer Neg Hx    Social History   Socioeconomic History  . Marital status: Married    Spouse name: [name]  . Number of children: Not on file  . Years of education: college  . Highest education level: Not on file  Occupational History  . Not on file  Social Needs  . Financial resource strain: Not on file  . Food insecurity:    Worry: Not on file    Inability: Not on file  . Transportation needs:    Medical: Not on file    Non-medical: Not on file  Tobacco Use  . Smoking status: Never Smoker  . Smokeless tobacco: Never Used  Substance and Sexual Activity  . Alcohol use: Yes    Alcohol/week: 14.0 standard drinks    Types: 14 Standard drinks or equivalent per week    Comment: wine daily 14 glasses per week  . Drug use: No  . Sexual  activity: Not Currently    Birth control/protection: Post-menopausal    Comment: 1st intercourse 88 yo-1 partner  Lifestyle  . Physical activity:    Days per week: Not on file    Minutes per session: Not on file  . Stress: Not on file  Relationships  . Social connections:    Talks on phone: Not on file    Gets together: Not on file    Attends religious service: Not on file    Active member of club or organization: Not on file    Attends meetings of clubs or organizations: Not on file    Relationship status: Not on file  Other Topics Concern  . Not on file  Social History Narrative   Patient lives at home with her husband [name]). 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      Education : 2 years [employer], 2 years [employer]      Hobbies: grandchildren time, garden, reading, [employer]      Right handed. Caffeine one cup daily    Outpatient Encounter Medications as of 05/01/2018  Medication Sig  . ALPRAZolam (XANAX) 0.5 MG tablet TAKE 1/2 TABLET TWICE DAILY AS NEEDED.  Marland Kitchen Biotin 1000 MCG tablet Take 1,000 mcg by mouth daily.  . calcium carbonate (OSCAL) 1500 (600 Ca) MG TABS tablet Take 1 tablet by mouth daily.  Marland Kitchen levothyroxine (SYNTHROID, LEVOTHROID) 88 MCG tablet TAKE 1 TABLET ONCE DAILY.  Marland Kitchen lisinopril (PRINIVIL,ZESTRIL) 40 MG tablet TAKE 1 TABLET EACH DAY.  . rosuvastatin (CRESTOR) 20 MG tablet Take 1 tablet (20 mg total) by mouth daily.  . sertraline (ZOLOFT) 100 MG tablet Take 1.5 tablets (150 mg total) by mouth daily.  Marland Kitchen zolpidem (AMBIEN) 5 MG tablet TAKE 1/2 TO 1 TABLET AT BEDTIME AS NEEDED.   No facility-administered encounter medications on file as of 05/01/2018.     Activities of Daily Living In your present state of health, do you have any difficulty performing the following activities: 05/01/2018  Hearing? N  Vision? N  Difficulty concentrating or making  decisions? N  Walking or climbing stairs? N  Dressing or bathing? N  Doing errands, shopping? N  Preparing Food and eating ? N  Using the Toilet? N  In the past six months, have you accidently leaked urine? N  Do you have problems with loss of bowel control? N  Managing your Medications? N  Managing your Finances? N  Housekeeping or managing your Housekeeping? N  Some recent data might be hidden    Patient Care Team: Marin Olp, MD as PCP - General (Family Medicine) Melissa Noon, [place] as Referring Physician (Optometry)    Assessment:   This is a routine wellness examination for [place].  Exercise Activities and Dietary recommendations Current Exercise Habits: Home exercise routine, Type of exercise: walking, Time (Minutes): 45, Frequency (Times/Week): 5, Weekly Exercise (Minutes/Week): 225, Intensity: Moderate  Goals    . Patient Stated     Continue to stay balanced and content       Fall Risk Fall Risk  05/01/2018 10/09/2017 02/06/2016 11/17/2014  Falls in the past year? No No No No     Depression Screen PHQ 2/9 Scores 05/01/2018 04/24/2018 03/03/2018 01/06/2018  PHQ - 2 Score 0 0 4 6  PHQ- 9 Score - 0 7 7     Cognitive Function MMSE - Mini Mental State Exam 05/01/2018  Not completed: (No Data)   Ad8 score reviewed for issues:  Issues making decisions:  Less interest in hobbies / activities:  Repeats questions, stories (family complaining):  Trouble using ordinary gadgets (microwave, computer, phone):  Forgets the month or year:   Mismanaging finances:   Remembering appts:  Daily problems with thinking and/or memory: Ad8 score is=0 Mother had dementia         Immunization History  Administered Date(s) Administered  . Influenza Split 06/05/2011, 06/03/2012  . Influenza Whole 07/09/2007, 06/04/2008, 06/28/2009, 07/12/2010  . Influenza, High Dose Seasonal PF 06/30/2013, 07/07/2014, 07/14/2015, 06/11/2016, 07/05/2017  . Pneumococcal Conjugate-13  02/06/2016  . Pneumococcal Polysaccharide-23 07/12/2010, 06/05/2011  . Td 10/08/2008  . Zoster 05/04/2010      Screening Tests Health Maintenance  Topic Date Due  . INFLUENZA VACCINE  04/03/2018  . Hepatitis C Screening  08/30/2098 (Originally 09-25-1945)  . TETANUS/TDAP  10/08/2018  . MAMMOGRAM  02/28/2020  . COLONOSCOPY  01/17/2022  . DEXA SCAN  Completed  . PNA vac Low Risk Adult  Completed         Plan:      PCP Notes   Health Maintenance Will take the flu vaccine in October   Colonoscopy 01/2017 - no more planned  Mammogram 02/2018  dexa 02/2018 osteopenic but no scores Has GYN  Will review osteoporosis [email or web address]   Educated regarding the shingrix    Abnormal Screens  No   Referrals  no  Patient concerns; May consider retirement  Nurse Concerns; As noted  Next PCP apt 05/03/2018       I have personally reviewed and noted the following in the patient's chart:   . Medical and social history . Use of alcohol, tobacco or illicit drugs  . Current medications and supplements . Functional ability and status . Nutritional status . Physical activity . Advanced directives . List of other physicians . Hospitalizations, surgeries, and ER visits in previous 12 months . Vitals . Screenings to include cognitive, depression, and falls . Referrals and appointments  In addition, I have reviewed and discussed with patient certain preventive protocols, quality metrics, and best practice recommendations. A written personalized care plan for preventive services as well as general preventive health recommendations were provided to patient.     Wynetta Fines, RN  05/01/2018

## 2018-05-01 NOTE — Patient Instructions (Addendum)
Caroline Peters , Thank you for taking time to come for your Medicare Wellness Visit. I appreciate your ongoing commitment to your health goals. Please review the following plan we discussed and let me know if I can assist you in the future.   Shingrix is a vaccine for the prevention of Shingles in Adults 50 and older.  If you are on Medicare, the shingrix is covered under your Part D plan, so you will take both of the vaccines in the series at your pharmacy. Please check with your benefits regarding applicable copays or out of pocket expenses.  The Shingrix is given in 2 vaccines approx 8 weeks apart. You must receive the 2nd dose prior to 6 months from receipt of the first. Please have the pharmacist print out you Immunization  dates for our office records    Recommendations for Dexa Scan Female over the age of 28 Man age 15 or older If you broke a bone past the age of 82 Women menopausal age with risk factors (thin frame; smoker; hx of fx ) Post menopausal women under the age of 9 with risk factors A man age 45 to 89 with risk factors Other: Spine xray that is showing break of bone loss Back pain with possible break Height loss of 1/2 inch or more within one year Total loss in height of 1.5 inches from your original height  Calcium 1249m with Vit D 800u per day; more as directed by physician Strength building exercises discussed; can include walking; housework; small weights or stretch bands; silver sneakers if [facility] to the [facility]  Please visit the osteoporosis foundation.org for up to date recommendations     These are the goals we discussed: Goals    . Patient Stated     Continue to stay balanced and content       This is a list of the screening recommended for you and due dates:  Health Maintenance  Topic Date Due  . Flu Shot  04/03/2018  .  Hepatitis C: One time screening is recommended by Center for Disease Control  (CDC) for  adults born from [birth year]through 1965.    08/30/2098*  . Tetanus Vaccine  10/08/2018  . Mammogram  02/28/2020  . Colon Cancer Screening  01/17/2022  . DEXA scan (bone density measurement)  Completed  . Pneumonia vaccines  Completed  *Topic was postponed. The date shown is not the original due date.      Fall Prevention in the Home Falls can cause injuries. They can happen to people of all ages. There are many things you can do to make your home safe and to help prevent falls. What can I do on the outside of my home?  Regularly fix the edges of walkways and driveways and fix any cracks.  Remove anything that might make you trip as you walk through a door, such as a raised step or threshold.  Trim any bushes or trees on the path to your home.  Use bright outdoor lighting.  Clear any walking paths of anything that might make someone trip, such as rocks or tools.  Regularly check to see if handrails are loose or broken. Make sure that both sides of any steps have handrails.  Any raised decks and porches should have guardrails on the edges.  Have any leaves, snow, or ice cleared regularly.  Use sand or salt on walking paths during winter.  Clean up any spills in your garage right away. This includes oil  or grease spills. What can I do in the bathroom?  Use night lights.  Install grab bars by the toilet and in the tub and shower. Do not use towel bars as grab bars.  Use non-skid mats or decals in the tub or shower.  If you need to sit down in the shower, use a plastic, non-slip stool.  Keep the floor dry. Clean up any water that spills on the floor as soon as it happens.  Remove soap buildup in the tub or shower regularly.  Attach bath mats securely with double-sided non-slip rug tape.  Do not have throw rugs and other things on the floor that can make you trip. What can I do in the bedroom?  Use night lights.  Make sure that you have a light by your bed that is easy to reach.  Do not use any sheets or  blankets that are too big for your bed. They should not hang down onto the floor.  Have a firm chair that has side arms. You can use this for support while you get dressed.  Do not have throw rugs and other things on the floor that can make you trip. What can I do in the kitchen?  Clean up any spills right away.  Avoid walking on wet floors.  Keep items that you use a lot in easy-to-reach places.  If you need to reach something above you, use a strong step stool that has a grab bar.  Keep electrical cords out of the way.  Do not use floor polish or wax that makes floors slippery. If you must use wax, use non-skid floor wax.  Do not have throw rugs and other things on the floor that can make you trip. What can I do with my stairs?  Do not leave any items on the stairs.  Make sure that there are handrails on both sides of the stairs and use them. Fix handrails that are broken or loose. Make sure that handrails are as long as the stairways.  Check any carpeting to make sure that it is firmly attached to the stairs. Fix any carpet that is loose or worn.  Avoid having throw rugs at the top or bottom of the stairs. If you do have throw rugs, attach them to the floor with carpet tape.  Make sure that you have a light switch at the top of the stairs and the bottom of the stairs. If you do not have them, ask someone to add them for you. What else can I do to help prevent falls?  Wear shoes that: ? Do not have high heels. ? Have rubber bottoms. ? Are comfortable and fit you well. ? Are closed at the toe. Do not wear sandals.  If you use a stepladder: ? Make sure that it is fully opened. Do not climb a closed stepladder. ? Make sure that both sides of the stepladder are locked into place. ? Ask someone to hold it for you, if possible.  Clearly mark and make sure that you can see: ? Any grab bars or handrails. ? First and last steps. ? Where the edge of each step is.  Use tools  that help you move around (mobility aids) if they are needed. These include: ? Canes. ? Walkers. ? Scooters. ? Crutches.  Turn on the lights when you go into a dark area. Replace any light bulbs as soon as they burn out.  Set up your furniture so you have  a clear path. Avoid moving your furniture around.  If any of your floors are uneven, fix them.  If there are any pets around you, be aware of where they are.  Review your medicines with your doctor. Some medicines can make you feel dizzy. This can increase your chance of falling. Ask your doctor what other things that you can do to help prevent falls. This information is not intended to replace advice given to you by your health care provider. Make sure you discuss any questions you have with your health care provider. Document Released: 06/16/2009 Document Revised: 01/26/2016 Document Reviewed: 09/24/2014 Elsevier Interactive Patient Education  2018 [place] Maintenance, Female Adopting a healthy lifestyle and getting preventive care can go a long way to promote health and wellness. Talk with your health care provider about what schedule of regular examinations is right for you. This is a good chance for you to check in with your provider about disease prevention and staying healthy. In between checkups, there are plenty of things you can do on your own. Experts have done a lot of research about which lifestyle changes and preventive measures are most likely to keep you healthy. Ask your health care provider for more information. Weight and diet Eat a healthy diet  Be sure to include plenty of vegetables, fruits, low-fat dairy products, and lean protein.  Do not eat a lot of foods high in solid fats, added sugars, or salt.  Get regular exercise. This is one of the most important things you can do for your health. ? Most adults should exercise for at least 150 minutes each week. The exercise should increase your heart  rate and make you sweat (moderate-intensity exercise). ? Most adults should also do strengthening exercises at least twice a week. This is in addition to the moderate-intensity exercise.  Maintain a healthy weight  Body mass index (BMI) is a measurement that can be used to identify possible weight problems. It estimates body fat based on height and weight. Your health care provider can help determine your BMI and help you achieve or maintain a healthy weight.  For females 36 years of age and older: ? A BMI below 18.5 is considered underweight. ? A BMI of 18.5 to 24.9 is normal. ? A BMI of 25 to 29.9 is considered overweight. ? A BMI of 30 and above is considered obese.  Watch levels of cholesterol and blood lipids  You should start having your blood tested for lipids and cholesterol at 73 years of age, then have this test every 5 years.  You may need to have your cholesterol levels checked more often if: ? Your lipid or cholesterol levels are high. ? You are older than 73 years of age. ? You are at high risk for heart disease.  Cancer screening Lung Cancer  Lung cancer screening is recommended for adults 50-59 years old who are at high risk for lung cancer because of a history of smoking.  A yearly low-dose CT scan of the lungs is recommended for people who: ? Currently smoke. ? Have quit within the past 15 years. ? Have at least a 30-pack-year history of smoking. A pack year is smoking an average of one pack of cigarettes a day for 1 year.  Yearly screening should continue until it has been 15 years since you quit.  Yearly screening should stop if you develop a health problem that would prevent you from having lung cancer treatment.  Breast Cancer  Practice breast self-awareness. This means understanding how your breasts normally appear and feel.  It also means doing regular breast self-exams. Let your health care provider know about any changes, no matter how small.  If  you are in your 20s or 30s, you should have a clinical breast exam (CBE) by a health care provider every 1-3 years as part of a regular health exam.  If you are 68 or older, have a CBE every year. Also consider having a breast X-ray (mammogram) every year.  If you have a family history of breast cancer, talk to your health care provider about genetic screening.  If you are at high risk for breast cancer, talk to your health care provider about having an MRI and a mammogram every year.  Breast cancer gene (BRCA) assessment is recommended for women who have family members with BRCA-related cancers. BRCA-related cancers include: ? Breast. ? Ovarian. ? Tubal. ? Peritoneal cancers.  Results of the assessment will determine the need for genetic counseling and BRCA1 and BRCA2 testing.  Cervical Cancer Your health care provider may recommend that you be screened regularly for cancer of the pelvic organs (ovaries, uterus, and vagina). This screening involves a pelvic examination, including checking for microscopic changes to the surface of your cervix (Pap test). You may be encouraged to have this screening done every 3 years, beginning at age 32.  For women ages 25-65, health care providers may recommend pelvic exams and Pap testing every 3 years, or they may recommend the Pap and pelvic exam, combined with testing for human papilloma virus (HPV), every 5 years. Some types of HPV increase your risk of cervical cancer. Testing for HPV may also be done on women of any age with unclear Pap test results.  Other health care providers may not recommend any screening for nonpregnant women who are considered low risk for pelvic cancer and who do not have symptoms. Ask your health care provider if a screening pelvic exam is right for you.  If you have had past treatment for cervical cancer or a condition that could lead to cancer, you need Pap tests and screening for cancer for at least 20 years after your  treatment. If Pap tests have been discontinued, your risk factors (such as having a new sexual partner) need to be reassessed to determine if screening should resume. Some women have medical problems that increase the chance of getting cervical cancer. In these cases, your health care provider may recommend more frequent screening and Pap tests.  Colorectal Cancer  This type of cancer can be detected and often prevented.  Routine colorectal cancer screening usually begins at 73 years of age and continues through 73 years of age.  Your health care provider may recommend screening at an earlier age if you have risk factors for colon cancer.  Your health care provider may also recommend using home test kits to check for hidden blood in the stool.  A small camera at the end of a tube can be used to examine your colon directly (sigmoidoscopy or colonoscopy). This is done to check for the earliest forms of colorectal cancer.  Routine screening usually begins at age 44.  Direct examination of the colon should be repeated every 5-10 years through 73 years of age. However, you may need to be screened more often if early forms of precancerous polyps or small growths are found.  Skin Cancer  Check your skin from head to toe regularly.  Tell your health care  provider about any new moles or changes in moles, especially if there is a change in a mole's shape or color.  Also tell your health care provider if you have a mole that is larger than the size of a pencil eraser.  Always use sunscreen. Apply sunscreen liberally and repeatedly throughout the day.  Protect yourself by wearing long sleeves, pants, a wide-brimmed hat, and sunglasses whenever you are outside.  Heart disease, diabetes, and high blood pressure  High blood pressure causes heart disease and increases the risk of stroke. High blood pressure is more likely to develop in: ? People who have blood pressure in the high end of the normal  range (130-139/85-89 mm Hg). ? People who are overweight or obese. ? People who are African American.  If you are 49-29 years of age, have your blood pressure checked every 3-5 years. If you are 58 years of age or older, have your blood pressure checked every year. You should have your blood pressure measured twice-once when you are at a hospital or clinic, and once when you are not at a hospital or clinic. Record the average of the two measurements. To check your blood pressure when you are not at a hospital or clinic, you can use: ? An automated blood pressure machine at a pharmacy. ? A home blood pressure monitor.  If you are between 33 years and 33 years old, ask your health care provider if you should take aspirin to prevent strokes.  Have regular diabetes screenings. This involves taking a blood sample to check your fasting blood sugar level. ? If you are at a normal weight and have a low risk for diabetes, have this test once every three years after 73 years of age. ? If you are overweight and have a high risk for diabetes, consider being tested at a younger age or more often. Preventing infection Hepatitis B  If you have a higher risk for hepatitis B, you should be screened for this virus. You are considered at high risk for hepatitis B if: ? You were born in a country where hepatitis B is common. Ask your health care provider which countries are considered high risk. ? Your parents were born in a high-risk country, and you have not been immunized against hepatitis B (hepatitis B vaccine). ? You have HIV or AIDS. ? You use needles to inject street drugs. ? You live with someone who has hepatitis B. ? You have had sex with someone who has hepatitis B. ? You get hemodialysis treatment. ? You take certain medicines for conditions, including cancer, organ transplantation, and autoimmune conditions.  Hepatitis C  Blood testing is recommended for: ? Everyone born from [birth year] through  1966. ? Anyone with known risk factors for hepatitis C.  Sexually transmitted infections (STIs)  You should be screened for sexually transmitted infections (STIs) including gonorrhea and chlamydia if: ? You are sexually active and are younger than 73 years of age. ? You are older than 73 years of age and your health care provider tells you that you are at risk for this type of infection. ? Your sexual activity has changed since you were last screened and you are at an increased risk for chlamydia or gonorrhea. Ask your health care provider if you are at risk.  If you do not have HIV, but are at risk, it may be recommended that you take a prescription medicine daily to prevent HIV infection. This is called pre-exposure prophylaxis (PrEP). You  are considered at risk if: ? You are sexually active and do not regularly use condoms or know the HIV status of your partner(s). ? You take drugs by injection. ? You are sexually active with a partner who has HIV.  Talk with your health care provider about whether you are at high risk of being infected with HIV. If you choose to begin PrEP, you should first be tested for HIV. You should then be tested every 3 months for as long as you are taking PrEP. Pregnancy  If you are premenopausal and you may become pregnant, ask your health care provider about preconception counseling.  If you may become pregnant, take 400 to 800 micrograms (mcg) of folic acid every day.  If you want to prevent pregnancy, talk to your health care provider about birth control (contraception). Osteoporosis and menopause  Osteoporosis is a disease in which the bones lose minerals and strength with aging. This can result in serious bone fractures. Your risk for osteoporosis can be identified using a bone density scan.  If you are 64 years of age or older, or if you are at risk for osteoporosis and fractures, ask your health care provider if you should be screened.  Ask your health  care provider whether you should take a calcium or vitamin D supplement to lower your risk for osteoporosis.  Menopause may have certain physical symptoms and risks.  Hormone replacement therapy may reduce some of these symptoms and risks. Talk to your health care provider about whether hormone replacement therapy is right for you. Follow these instructions at home:  Schedule regular health, dental, and eye exams.  Stay current with your immunizations.  Do not use any tobacco products including cigarettes, chewing tobacco, or electronic cigarettes.  If you are pregnant, do not drink alcohol.  If you are breastfeeding, limit how much and how often you drink alcohol.  Limit alcohol intake to no more than 1 drink per day for nonpregnant women. One drink equals 12 ounces of beer, 5 ounces of wine, or 1 ounces of hard liquor.  Do not use street drugs.  Do not share needles.  Ask your health care provider for help if you need support or information about quitting drugs.  Tell your health care provider if you often feel depressed.  Tell your health care provider if you have ever been abused or do not feel safe at home. This information is not intended to replace advice given to you by your health care provider. Make sure you discuss any questions you have with your health care provider. Document Released: 03/05/2011 Document Revised: 01/26/2016 Document Reviewed: 05/24/2015 Elsevier Interactive Patient Education  [employer].

## 2018-05-25 ENCOUNTER — Other Ambulatory Visit: Payer: Self-pay | Admitting: Family Medicine

## 2018-06-01 ENCOUNTER — Other Ambulatory Visit: Payer: Self-pay | Admitting: Family Medicine

## 2018-06-02 ENCOUNTER — Ambulatory Visit (INDEPENDENT_AMBULATORY_CARE_PROVIDER_SITE_OTHER): Payer: Medicare Other

## 2018-06-02 ENCOUNTER — Encounter: Payer: Self-pay | Admitting: Family Medicine

## 2018-06-02 DIAGNOSIS — Z23 Encounter for immunization: Secondary | ICD-10-CM | POA: Diagnosis not present

## 2018-06-03 ENCOUNTER — Ambulatory Visit
Admit: 2018-06-03 | Discharge: 2018-06-03 | Payer: PRIVATE HEALTH INSURANCE | Attending: Internal Medicine | Primary: Internal Medicine

## 2018-06-03 ENCOUNTER — Ambulatory Visit: Attending: Internal Medicine | Primary: Internal Medicine

## 2018-06-03 DIAGNOSIS — E119 Type 2 diabetes mellitus without complications: Secondary | ICD-10-CM

## 2018-06-03 MED ORDER — SEMAGLUTIDE 0.25 MG OR 0.5 MG (2 MG/1.5 ML) SUBCUTANEOUS PEN INJECTOR
0.25 mg or 0.5 mg(2 mg/1.5 mL) | SUBCUTANEOUS | 0 refills | Status: DC
Start: 2018-06-03 — End: 2018-12-17

## 2018-06-03 NOTE — Progress Notes (Signed)
History of Present Illness: Jamie Park is a 72 y.o. female presents for follow-up of diabetes.  She has had diabetes for since 1989.  Most recent A1c 6.7 04/24/2018    Diabetes related complications:  none    Current diabetes regimen:  Metformin 1000 mg twice daily  NPH -10 units twice daily  Ozempic 0.5 mg daily - in coverage gap.  She is using the 1 mg dosing pens and taking 0.5 mg weekly by counting 37 clicks.    Glucoses:  117 fasting sometimes.  Over 200 this AM after pizza.   None < 70    Diet:  Better glucoses if she finishing dinner prior to 6:30  Had pizza last night and values much higher.     HTN: Currently taking amlodipine, lisinopril and indapamide 2.5.    higher today. High salt diet last night (pizzap).  Reports BP controlled elsewhere.    Dyslipidemia: rosuvastatin.    Hypothyroidism: Taking levothyroxine 75 mcg.  Started 08/2017.  TSH 1.22 in early 2019  BMI 30: Overall she reports feeling quite well.  She is making efforts with her diet and is more active.  Social:  Married    Past Medical History:   Diagnosis Date   ??? Breast cancer (HCC) 1988   ??? Diabetes mellitus (HCC)     1989     Current Outpatient Medications   Medication Sig   ??? lisinopril (PRINIVIL, ZESTRIL) 20 mg tablet    ??? rosuvastatin (CRESTOR) 20 mg tablet    ??? Insulin Syringe-Needle U-100 0.3 mL 30 syrg UTD   ??? semaglutide (OZEMPIC) 1 mg/0.75 mL (2 mg/1.5 mL) sub-q pen 1 mg by SubCUTAneous route every seven (7) days.   ??? levothyroxine (SYNTHROID) 75 mcg tablet Take 1 Tab by mouth Daily (before breakfast). For hypothyroidism   ??? insulin NPH (NOVOLIN N, HUMULIN N) 100 unit/mL injection Inject 15 units subcutaneously twice daily   ??? indapamide (LOZOL) 2.5 mg tablet Take 1 Tab by mouth daily. For blood pressure. Replaces HCTZ   ??? TRUE METRIX GLUCOSE TEST STRIP strip    ??? amLODIPine (NORVASC) 5 mg tablet take 1 tablet by mouth once daily   ??? metFORMIN (GLUCOPHAGE) 500 mg tablet Take 2 Tabs by mouth two (2) times daily (with meals).   ???  semaglutide (OZEMPIC) 0.25 mg/0.2 mL (2 mg/1.5 mL) sub-q pen 0.5 mg by SubCUTAneous route every seven (7) days.     No current facility-administered medications for this visit.      No Known Allergies    Review of Systems:  - Eyes: no blurry vision or double vision  - Cardiovascular: no chest pain  - Respiratory: no shortness of breath  - Musculoskeletal: no myalgias  - Neurological: no numbness/tingling in extremities    Physical Examination:  Visit Vitals  BP 153/64 (BP 1 Location: Right arm, BP Patient Position: Sitting)   Pulse 63   Ht 5\' 4"  (1.626 m)   Wt 177 lb 6.4 oz (80.5 kg)   BMI 30.45 kg/m??   -   - General: pleasant, no distress, normal gait   HEENT: hearing intact, EOMI, clear sclera without icterus  - Cardiovascular: regular, normal rate   - Respiratory: normal effort  - Integumentary: no edema  - Psychiatric: normal mood and affect    Data Reviewed:   Lab Results   Component Value Date/Time    Hemoglobin A1c 9.4 08/02/2017 03:21 PM    Hemoglobin A1c, External 7.7 09/17/2017  Lab Results   Component Value Date/Time    Sodium 141 08/02/2017 03:21 PM    Potassium 4.4 08/02/2017 03:21 PM    Creatinine 0.94 08/02/2017 03:21 PM    Creatinine, External 0.92 09/17/2017        Lab Results   Component Value Date/Time    LDL-C, External 52 09/17/2017      Lab Results   Component Value Date/Time    TSH 10.460 08/02/2017 03:21 PM    T4, Free 1.14 08/02/2017 03:21 PM    Thyroid peroxidase Ab 11 05/03/2017 04:02 PM        Assessment/Plan:   1. Type 2 diabetes mellitus without complication, with long-term current use of insulin (HCC)   Control is overall good  Continued dietary and exercise efforts in order to lose weight.  Continue efforts to taper off of the insulin.  She has a strong desire to take insulin  Continue Ozempic 0 point samples provided today.  We will have her continue to use the 1 mg dosing pen's take 37 clicks to help with cost control.  Continue metformin   2. Acquired hypothyroidism   Continue  levothyroxine 75 mcg   3. HTN (hypertension), benign   Blood pressure higher today, but she reports it is controlled elsewhere.     4. Dyslipidemia   Continue rosuvastatin   5. BMI 30.0-30.9,adult   Encouraged diet and exercise efforts in order to lose weight.  This would help her to no longer need the insulin..     Patient Instructions   Diabetes.  Continue dietary efforts and weight loss.     Medications:  Continue metformin to 1000 mg twice daily    Recommend Ozempic 0.5 mg once weekly - 37 clicks with 1.0 mg dosing pen.     NPH - continue 8 units twice daily  Lower doses for values < 100 if they are occurring often.     Goals for blood glucose:  Fasting 80-130 (less than 150)  Lunch, Dinner, Bedtime - 80-150 (less than 180).    Blood pressure:  Continue indapamide 2.5 mg daily  Continue amlodipine 5 mg + lisinopril     Continue rosuvastatin for cholesterol     Follow-up and Dispositions    ?? Return in about 6 months (around 12/03/2018).

## 2018-06-03 NOTE — Progress Notes (Signed)
History of Present Illness: Jamie Park is a 72 y.o. female presents for follow-up of diabetes.  She has had diabetes for since 1989.  Most recent A1c 6.7 04/24/2018    Diabetes related complications:  none    Current diabetes regimen:  Metformin 1000 mg twice daily  NPH -10 units twice daily  Ozempic 0.5 mg daily - in coverage gap.  She is using the 1 mg dosing pens and taking 0.5 mg weekly by counting 37 clicks.    Glucoses:  117 fasting sometimes.  Over 200 this AM after pizza.   None < 70    Diet:  Better glucoses if she finishing dinner prior to 6:30  Had pizza last night and values much higher.     HTN: Currently taking amlodipine, lisinopril and indapamide 2.5.    higher today. High salt diet last night (pizzap).  Reports BP controlled elsewhere.    Dyslipidemia: rosuvastatin.    Hypothyroidism: Taking levothyroxine 75 mcg.  Started 08/2017.  TSH 1.22 in early 2019  BMI 30: Overall she reports feeling quite well.  She is making efforts with her diet and is more active.  Social:  Married    Past Medical History:   Diagnosis Date   ??? Breast cancer (HCC) 1988   ??? Diabetes mellitus (HCC)     1989     Current Outpatient Medications   Medication Sig   ??? lisinopril (PRINIVIL, ZESTRIL) 20 mg tablet    ??? rosuvastatin (CRESTOR) 20 mg tablet    ??? Insulin Syringe-Needle U-100 0.3 mL 30 syrg UTD   ??? semaglutide (OZEMPIC) 1 mg/0.75 mL (2 mg/1.5 mL) sub-q pen 1 mg by SubCUTAneous route every seven (7) days.   ??? levothyroxine (SYNTHROID) 75 mcg tablet Take 1 Tab by mouth Daily (before breakfast). For hypothyroidism   ??? insulin NPH (NOVOLIN N, HUMULIN N) 100 unit/mL injection Inject 15 units subcutaneously twice daily   ??? indapamide (LOZOL) 2.5 mg tablet Take 1 Tab by mouth daily. For blood pressure. Replaces HCTZ   ??? TRUE METRIX GLUCOSE TEST STRIP strip    ??? amLODIPine (NORVASC) 5 mg tablet take 1 tablet by mouth once daily   ??? metFORMIN (GLUCOPHAGE) 500 mg tablet Take 2 Tabs by mouth two (2) times daily (with meals).    ??? semaglutide (OZEMPIC) 0.25 mg/0.2 mL (2 mg/1.5 mL) sub-q pen 0.5 mg by SubCUTAneous route every seven (7) days.     No current facility-administered medications for this visit.      No Known Allergies    Review of Systems:  - Eyes: no blurry vision or double vision  - Cardiovascular: no chest pain  - Respiratory: no shortness of breath  - Musculoskeletal: no myalgias  - Neurological: no numbness/tingling in extremities    Physical Examination:  Visit Vitals  BP 153/64 (BP 1 Location: Right arm, BP Patient Position: Sitting)   Pulse 63   Ht 5\' 4"  (1.626 m)   Wt 177 lb 6.4 oz (80.5 kg)   BMI 30.45 kg/m??   -   - General: pleasant, no distress, normal gait   HEENT: hearing intact, EOMI, clear sclera without icterus  - Cardiovascular: regular, normal rate   - Respiratory: normal effort  - Integumentary: no edema  - Psychiatric: normal mood and affect    Data Reviewed:   Lab Results   Component Value Date/Time    Hemoglobin A1c 9.4 08/02/2017 03:21 PM    Hemoglobin A1c, External 7.7 09/17/2017  Lab Results   Component Value Date/Time    Sodium 141 08/02/2017 03:21 PM    Potassium 4.4 08/02/2017 03:21 PM    Creatinine 0.94 08/02/2017 03:21 PM    Creatinine, External 0.92 09/17/2017        Lab Results   Component Value Date/Time    LDL-C, External 52 09/17/2017      Lab Results   Component Value Date/Time    TSH 10.460 08/02/2017 03:21 PM    T4, Free 1.14 08/02/2017 03:21 PM    Thyroid peroxidase Ab 11 05/03/2017 04:02 PM        Assessment/Plan:   1. Type 2 diabetes mellitus without complication, with long-term current use of insulin (HCC)   Control is overall good  Continued dietary and exercise efforts in order to lose weight.  Continue efforts to taper off of the insulin.  She has a strong desire to take insulin  Continue Ozempic 0 point samples provided today.  We will have her continue to use the 1 mg dosing pen's take 37 clicks to help with cost control.  Continue metformin   2. Acquired hypothyroidism    Continue levothyroxine 75 mcg   3. HTN (hypertension), benign   Blood pressure higher today, but she reports it is controlled elsewhere.     4. Dyslipidemia   Continue rosuvastatin   5. BMI 30.0-30.9,adult   Encouraged diet and exercise efforts in order to lose weight.  This would help her to no longer need the insulin..     Patient Instructions   Diabetes.  Continue dietary efforts and weight loss.     Medications:  Continue metformin to 1000 mg twice daily    Recommend Ozempic 0.5 mg once weekly - 37 clicks with 1.0 mg dosing pen.     NPH - continue 8 units twice daily  Lower doses for values < 100 if they are occurring often.     Goals for blood glucose:  Fasting 80-130 (less than 150)  Lunch, Dinner, Bedtime - 80-150 (less than 180).    Blood pressure:  Continue indapamide 2.5 mg daily  Continue amlodipine 5 mg + lisinopril     Continue rosuvastatin for cholesterol     Follow-up and Dispositions    ?? Return in about 6 months (around 12/03/2018).

## 2018-06-03 NOTE — Patient Instructions (Addendum)
Diabetes.  Continue dietary efforts and weight loss.     Medications:  Continue metformin to 1000 mg twice daily    Recommend Ozempic 0.5 mg once weekly - 37 clicks with 1.0 mg dosing pen.     NPH - continue 8 units twice daily  Lower doses for values < 100 if they are occurring often.     Goals for blood glucose:  Fasting 80-130 (less than 150)  Lunch, Dinner, Bedtime - 80-150 (less than 180).    Blood pressure:  Continue indapamide 2.5 mg daily  Continue amlodipine 5 mg + lisinopril     Continue rosuvastatin for cholesterol

## 2018-07-19 ENCOUNTER — Other Ambulatory Visit: Payer: Self-pay | Admitting: Family Medicine

## 2018-08-02 ENCOUNTER — Other Ambulatory Visit: Payer: Self-pay | Admitting: Family Medicine

## 2018-08-05 MED ORDER — INDAPAMIDE 2.5 MG TAB
2.5 mg | ORAL_TABLET | ORAL | 10 refills | Status: AC
Start: 2018-08-05 — End: ?

## 2018-08-11 ENCOUNTER — Encounter: Payer: Self-pay | Admitting: Physician Assistant

## 2018-08-11 ENCOUNTER — Ambulatory Visit: Payer: Medicare Other | Admitting: Physician Assistant

## 2018-08-11 ENCOUNTER — Ambulatory Visit: Payer: Self-pay | Admitting: *Deleted

## 2018-08-11 VITALS — BP 110/62 | HR 66 | Temp 98.6°F | Ht 62.0 in | Wt 135.5 lb

## 2018-08-11 DIAGNOSIS — R109 Unspecified abdominal pain: Secondary | ICD-10-CM | POA: Diagnosis not present

## 2018-08-11 LAB — POCT URINALYSIS DIPSTICK
Bilirubin, UA: NEGATIVE
Blood, UA: NEGATIVE
Glucose, UA: NEGATIVE
Ketones, UA: NEGATIVE
LEUKOCYTES UA: NEGATIVE
NITRITE UA: NEGATIVE
PH UA: 6 (ref 5.0–8.0)
PROTEIN UA: NEGATIVE
Spec Grav, UA: 1.015 (ref 1.010–1.025)
UROBILINOGEN UA: 0.2 U/dL

## 2018-08-11 MED ORDER — CIPROFLOXACIN HCL 500 MG PO TABS: 500.0000 mg | ORAL_TABLET | Freq: Two times a day (BID) | ORAL | 0 refills | Status: AC

## 2018-08-11 MED ORDER — METRONIDAZOLE 500 MG PO TABS: 500.0000 mg | ORAL_TABLET | Freq: Three times a day (TID) | ORAL | 0 refills | Status: DC

## 2018-08-11 NOTE — Telephone Encounter (Signed)
See note

## 2018-08-11 NOTE — Patient Instructions (Signed)
Start the antibiotics.  No alcohol. Limit foods that are difficult to digest.   Diverticulitis Diverticulitis is when small pockets in your large intestine (colon) get infected or swollen. This causes stomach pain and watery poop (diarrhea). These pouches are called diverticula. They form in people who have a condition called diverticulosis. Follow these instructions at home: Medicines  Take over-the-counter and prescription medicines only as told by your doctor. These include: ? Antibiotics. ? Pain medicines. ? Fiber pills. ? Probiotics. ? Stool softeners.  Do not drive or use heavy machinery while taking prescription pain medicine.  If you were prescribed an antibiotic, take it as told. Do not stop taking it even if you feel better. General instructions  Follow a diet as told by your doctor.  When you feel better, your doctor may tell you to change your diet. You may need to eat a lot of fiber. Fiber makes it easier to poop (have bowel movements). Healthy foods with fiber include: ? Berries. ? Beans. ? Lentils. ? Green vegetables.  Exercise 3 or more times a week. Aim for 30 minutes each time. Exercise enough to sweat and make your heart beat faster.  Keep all follow-up visits as told. This is important. You may need to have an exam of the large intestine. This is called a colonoscopy. Contact a doctor if:  Your pain does not get better.  You have a hard time eating or drinking.  You are not pooping like normal. Get help right away if:  Your pain gets worse.  Your problems do not get better.  Your problems get worse very fast.  You have a fever.  You throw up (vomit) more than one time.  You have poop that is: ? Bloody. ? Black. ? Tarry. Summary  Diverticulitis is when small pockets in your large intestine (colon) get infected or swollen.  Take medicines only as told by your doctor.  Follow a diet as told by your doctor. This information is not  intended to replace advice given to you by your health care provider. Make sure you discuss any questions you have with your health care provider. Document Released: 02/06/2008 Document Revised: 09/06/2016 Document Reviewed: 09/06/2016 Elsevier Interactive Patient Education  2017 [employer].

## 2018-08-11 NOTE — Progress Notes (Signed)
Caroline Peters is a 72 y.o. female here for a new problem.  I acted as a Education administrator for [employer], PA-C Anselmo Pickler, LPN  History of Present Illness:   Chief Complaint  Patient presents with  . Abdominal Pain    Abdominal Pain  This is a new problem. Episode onset: started 3-4 days ago. The onset quality is sudden. The problem occurs intermittently. The problem has been unchanged. The pain is located in the LLQ. The patient is experiencing no pain. The quality of the pain is sharp. The abdominal pain does not radiate. Associated symptoms include diarrhea (past 3-4 days, going up to 6 x's a day), nausea and vomiting (x 1). Nothing aggravates the pain. The pain is relieved by nothing. She has tried antacids (Tums) for the symptoms. The treatment provided no relief. There is no history of irritable bowel syndrome.   She has a history of diverticulitis. Her most recent flare was about 1 year ago.  She is able to tolerate fluids well presently.  She also endorses recent increase in heartburn, which she has been taking Tums for this without relief. She has not tried any other medication.  She denies left sided chest pain, radiation of pain, pain with activity, shortness of breath, fever, chills, neck pain.   Past Medical History:  Diagnosis Date  . Anxiety   . Cancer ([place])    Melanoma- on arm age 6   . DEPRESSION 04/16/2007  . Fibroadenoma of breast   . HA (headache)    hx of   . Hyperlipidemia   . Hypertension   . Hypothyroid   . HYPOTHYROIDISM 04/16/2007  . Kidney stone   . Osteopenia 02/2018   T score -1.9 FRAX 10% / 1.4% stable from prior DEXA.     Social History   Socioeconomic History  . Marital status: Married    Spouse name: [name]  . Number of children: Not on file  . Years of education: college  . Highest education level: Not on file  Occupational History  . Not on file  Social Needs  . Financial resource strain: Not on file  . Food insecurity:    Worry:  Not on file    Inability: Not on file  . Transportation needs:    Medical: Not on file    Non-medical: Not on file  Tobacco Use  . Smoking status: Never Smoker  . Smokeless tobacco: Never Used  Substance and Sexual Activity  . Alcohol use: Yes    Alcohol/week: 14.0 standard drinks    Types: 14 Standard drinks or equivalent per week    Comment: wine daily 14 glasses per week  . Drug use: No  . Sexual activity: Not Currently    Birth control/protection: Post-menopausal    Comment: 1st intercourse 49 yo-1 partner  Lifestyle  . Physical activity:    Days per week: Not on file    Minutes per session: Not on file  . Stress: Not on file  Relationships  . Social connections:    Talks on phone: Not on file    Gets together: Not on file    Attends religious service: Not on file    Active member of club or organization: Not on file    Attends meetings of clubs or organizations: Not on file    Relationship status: Not on file  . Intimate partner violence:    Fear of current or ex partner: Not on file    Emotionally abused: Not on  file    Physically abused: Not on file    Forced sexual activity: Not on file  Other Topics Concern  . Not on file  Social History Narrative   Patient lives at home with her husband [name]). 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      Education : 2 years [employer], 2 years [employer]      Hobbies: grandchildren time, garden, reading, [employer]      Right handed. Caffeine one cup daily    Past Surgical History:  Procedure Laterality Date  . BREAST SURGERY     bx left breast- and removal of fibroadenoma  . COLONOSCOPY    . EXCISION OF MELANOMA  1978  . HYSTEROSCOPY  1988   HYSTEROSCOPY,D&C  . LAPAROSCOPIC APPENDECTOMY N/A 11/10/2015   Procedure: APPENDECTOMY LAPAROSCOPIC W PARTIAL CECECTOMY ;  Surgeon: Jackolyn Confer, MD;  Location: [facility];   Service: General;  Laterality: N/A;  . LAPAROSCOPIC ILEOCECECTOMY N/A 11/10/2015   Procedure: LAPAROSCOPIC ILEOCECECTOMY;  Surgeon: Jackolyn Confer, MD;  Location: [facility];  Service: General;  Laterality: N/A;  . MASS EXCISION Right 02/23/2014   Procedure: BIOPSY TEMPORAL ARTERY RIGHT;  Surgeon: Rozetta Nunnery, MD;  Location: [facility];  Service: ENT;  Laterality: Right;  . POLYPECTOMY    . TONSILLECTOMY      Family History  Problem Relation Age of Onset  . Diabetes Mother   . Heart disease Mother 27  . Dementia Mother   . Diabetes Father   . Heart disease Father 38  . Cancer Father        Skin cancer-led to death  . Breast cancer Maternal Grandmother        Age 40  . Colon cancer Other        first cousin dx'd in his 80's  . Stomach cancer Neg Hx   . Colon polyps Neg Hx   . Esophageal cancer Neg Hx   . Rectal cancer Neg Hx     Allergies  Allergen Reactions  . Compazine Other (See Comments)    extrapyramidal syndrome  . Prednisone Other (See Comments)    Mood swings - crying     Current Medications:   Current Outpatient Medications:  .  ALPRAZolam (XANAX) 0.5 MG tablet, TAKE 1/2 TABLET TWICE DAILY AS NEEDED., Disp: 30 tablet, Rfl: 1 .  Biotin 1000 MCG tablet, Take 1,000 mcg by mouth daily., Disp: , Rfl:  .  calcium carbonate (OSCAL) 1500 (600 Ca) MG TABS tablet, Take 1 tablet by mouth daily., Disp: , Rfl:  .  levothyroxine (SYNTHROID, LEVOTHROID) 88 MCG tablet, TAKE 1 TABLET ONCE DAILY., Disp: 90 tablet, Rfl: 3 .  lisinopril (PRINIVIL,ZESTRIL) 40 MG tablet, TAKE 1 TABLET EACH DAY., Disp: 90 tablet, Rfl: 0 .  rosuvastatin (CRESTOR) 20 MG tablet, Take 1 tablet (20 mg total) by mouth daily., Disp: 90 tablet, Rfl: 3 .  sertraline (ZOLOFT) 100 MG tablet, Take 1.5 tablets (150 mg total) by mouth daily., Disp: 135 tablet, Rfl: 2 .  zolpidem (AMBIEN) 5 MG tablet, TAKE 1/2 TO 1 TABLET AT BEDTIME AS NEEDED., Disp: 30 tablet, Rfl: 0 .  ciprofloxacin (CIPRO) 500  MG tablet, Take 1 tablet (500 mg total) by mouth 2 (two) times daily for 10 days., Disp: 20 tablet, Rfl: 0 .  metroNIDAZOLE (FLAGYL) 500 MG tablet, Take 1 tablet (  500 mg total) by mouth 3 (three) times daily., Disp: 21 tablet, Rfl: 0   Review of Systems:   Review of Systems  Gastrointestinal: Positive for abdominal pain, diarrhea (past 3-4 days, going up to 6 x's a day), nausea and vomiting (x 1).  Negative unless otherwise specified per HPI.  Vitals:   Vitals:   08/11/18 1120  BP: 110/62  Pulse: 66  Temp: 98.6 F (37 C)  TempSrc: Oral  SpO2: 98%  Weight: 135 lb 8 oz (61.5 kg)  Height: 5\' 2"  (1.575 m)     Body mass index is 24.78 kg/m.  Physical Exam:   Physical Exam  Constitutional: She appears well-developed. She is cooperative.  Non-toxic appearance. She does not have a sickly appearance. She does not appear ill. No distress.  Cardiovascular: Normal rate, regular rhythm, S1 normal, S2 normal, normal heart sounds and normal pulses.  No LE edema  Pulmonary/Chest: Effort normal and breath sounds normal.  Abdominal: Normal appearance and bowel sounds are normal. There is tenderness in the left lower quadrant. There is no rigidity, no rebound, no guarding and no CVA tenderness.  Neurological: She is alert. GCS eye subscore is 4. GCS verbal subscore is 5. GCS motor subscore is 6.  Skin: Skin is warm, dry and intact.  Psychiatric: She has a normal mood and affect. Her speech is normal and behavior is normal.  Nursing note and vitals reviewed.  Results for orders placed or performed in visit on 08/11/18  POCT urinalysis dipstick  Result Value Ref Range   Color, UA yellow    Clarity, UA clear    Glucose, UA Negative Negative   Bilirubin, UA Negative    Ketones, UA Negative    Spec Grav, UA 1.015 1.010 - 1.025   Blood, UA Negative    pH, UA 6.0 5.0 - 8.0   Protein, UA Negative Negative   Urobilinogen, UA 0.2 0.2 or 1.0 E.U./dL   Nitrite, UA Negative    Leukocytes, UA  Negative Negative   Appearance     Odor      Assessment and Plan:   Ambriel was seen today for abdominal pain.  Diagnoses and all orders for this visit:  Abdominal pain, unspecified abdominal location -     POCT urinalysis dipstick  Other orders -     ciprofloxacin (CIPRO) 500 MG tablet; Take 1 tablet (500 mg total) by mouth 2 (two) times daily for 10 days. -     metroNIDAZOLE (FLAGYL) 500 MG tablet; Take 1 tablet (500 mg total) by mouth 3 (three) times daily.   Urinalysis negative.  No red flags on exam. Suspect acute diverticulitis.  Start Cipro and Flagyl.  List of worsening symptoms and ER precautions given.  Follow-up if symptoms do not improve.  I also recommended that she transition to a liquid diet at this time. Trial prilosec for heartburn, if no improvement, discussed close follow-up.  . Reviewed expectations re: course of current medical issues. . Discussed self-management of symptoms. . Outlined signs and symptoms indicating need for more acute intervention. . Patient verbalized understanding and all questions were answered. . See orders for this visit as documented in the electronic medical record. . Patient received an After-Visit Summary.  CMA or LPN served as scribe during this visit. History, Physical, and Plan performed by medical provider. The above documentation has been reviewed and is accurate and complete.  Inda Coke, PA-C

## 2018-08-11 NOTE — Telephone Encounter (Signed)
   Reason for Disposition . Age > 60 years    Patient reporting pain that comes and goes- not present now. Patient reports it is very painful when occurring- but does not last long. Patient reports she has had diverticulitis in the past and she reports she feels this is in the same area and her symptoms are very much the same.  Answer Assessment - Initial Assessment Questions 1. LOCATION: "Where does it hurt?"      LLQ 2. RADIATION: "Does the pain shoot anywhere else?" (e.g., chest, back)     No- stays in that area 3. ONSET: "When did the pain begin?" (e.g., minutes, hours or days ago)      On/off 3-4 days 4. SUDDEN: "Gradual or sudden onset?"     Sudden when occurs 5. PATTERN "Does the pain come and go, or is it constant?"    - If constant: "Is it getting better, staying the same, or worsening?"      (Note: Constant means the pain never goes away completely; most serious pain is constant and it progresses)     - If intermittent: "How long does it last?" "Do you have pain now?"     (Note: Intermittent means the pain goes away completely between bouts)     Comes and goes, minutes- seconds, no pain now 6. SEVERITY: "How bad is the pain?"  (e.g., Scale 1-10; mild, moderate, or severe)   - MILD (1-3): doesn't interfere with normal activities, abdomen soft and not tender to touch    - MODERATE (4-7): interferes with normal activities or awakens from sleep, tender to touch    - SEVERE (8-10): excruciating pain, doubled over, unable to do any normal activities      When occurring- 9 7. RECURRENT SYMPTOM: "Have you ever had this type of abdominal pain before?" If so, ask: "When was the last time?" and "What happened that time?"      Yes- once before- very similar to diverticulitis 8. CAUSE: "What do you think is causing the abdominal pain?"     Patient thinks she may be having reoccurance 9. RELIEVING/AGGRAVATING FACTORS: "What makes it better or worse?" (e.g., movement, antacids, bowel  movement)     no 10. OTHER SYMPTOMS: "Has there been any vomiting, diarrhea, constipation, or urine problems?"       Vomiting- once yesterday   , diarrhea- 4-5 times yesterday ( diarrhea had been going on for 2-3 days- not acute) 11. PREGNANCY: "Is there any chance you are pregnant?" "When was your last menstrual period?"       n/a  Protocols used: ABDOMINAL PAIN - [facility] [place]

## 2018-08-13 ENCOUNTER — Ambulatory Visit: Payer: Medicare Other | Admitting: Family Medicine

## 2018-08-13 ENCOUNTER — Encounter: Payer: Self-pay | Admitting: Family Medicine

## 2018-08-13 VITALS — BP 108/60 | HR 65 | Temp 97.3°F | Ht 62.0 in | Wt 136.4 lb

## 2018-08-13 DIAGNOSIS — R1032 Left lower quadrant pain: Secondary | ICD-10-CM | POA: Diagnosis not present

## 2018-08-13 DIAGNOSIS — K5792 Diverticulitis of intestine, part unspecified, without perforation or abscess without bleeding: Secondary | ICD-10-CM

## 2018-08-13 NOTE — Progress Notes (Signed)
Subjective:  NIRA VISSCHER is a 72 y.o. year old very pleasant female patient who presents for/with See problem oriented charting ROS-no fevers, chills,  vomiting, or recent weight change- weight up 1 lbs from monday. Has hadfatigue/malaise, nausea.   Past Medical History-  Patient Active Problem List   Diagnosis Date Noted  . Insomnia 06/23/2014    Priority: Medium  . Essential hypertension, benign 03/03/2014    Priority: Medium  . Hyperlipidemia 12/11/2011    Priority: Medium  . Osteopenia     Priority: Medium  . Hypothyroid     Priority: Medium  . Recurrent major depression in partial remission ([place]) 04/16/2007    Priority: Medium  . History of adenomatous polyp of colon 05/20/2015    Priority: Low  . Hair loss 05/20/2015    Priority: Low  . HA (headache)     Priority: Low  . Melanoma     Priority: Low  . Diverticulitis 11/05/2017  . Ganglion of right wrist 03/27/2017  . Abnormal finding on diagnostic imaging of extremity 02/20/2017  . Adhesive capsulitis of left shoulder 02/13/2017  . 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 ALPRAZolam (XANAX) 0.5 MG tablet TAKE 1/2 TABLET TWICE DAILY AS NEEDED. 30 tablet 1  . Biotin 1000 MCG tablet Take 1,000 mcg by mouth daily.    . calcium carbonate (OSCAL) 1500 (600 Ca) MG TABS tablet Take 1 tablet by mouth daily.    . ciprofloxacin (CIPRO) 500 MG tablet Take 1 tablet (500 mg total) by mouth 2 (two) times daily for 10 days. 20 tablet 0  . levothyroxine (SYNTHROID, LEVOTHROID) 88 MCG tablet TAKE 1 TABLET ONCE DAILY. 90 tablet 3  . lisinopril (PRINIVIL,ZESTRIL) 40 MG tablet TAKE 1 TABLET EACH DAY. 90 tablet 0  . metroNIDAZOLE (FLAGYL) 500 MG tablet Take 1 tablet (500 mg total) by mouth 3 (three) times daily. 21 tablet 0  . rosuvastatin (CRESTOR) 20 MG tablet Take 1 tablet (20 mg total) by mouth daily. 90 tablet 3  . sertraline (ZOLOFT) 100  MG tablet Take 1.5 tablets (150 mg total) by mouth daily. 135 tablet 2  . zolpidem (AMBIEN) 5 MG tablet TAKE 1/2 TO 1 TABLET AT BEDTIME AS NEEDED. 30 tablet 0   Objective: BP 108/60 (BP Location: Left Arm, Patient Position: Sitting, Cuff Size: Normal)   Pulse 65   Temp (!) 97.3 F (36.3 C) (Oral)   Ht 5\' 2"  (1.575 m)   Wt 136 lb 6.1 oz (61.9 kg)   SpO2 98%   BMI 24.94 kg/m  Gen: Appears in discomfort at times, appears fatigued CV: RRR no murmurs rubs or gallops Lungs: CTAB no crackles, wheeze, rhonchi Abdomen: soft/ESC dizzy area/nondistended/normal bowel sounds. No rebound or guarding.  Ext: no edema, some tenderness to palpation of both calves but far worse on right Skin: warm, dry  Assessment/Plan:  Diverticulitis follow-up  s:  patient treated for diverticulitis starting 08/11/18 with cipro/flagyl with LLQ pain and diarrhea up to 6x a day, vomited x1 - symptoms started Sunday and seen Monday. Prior episode diverticulitis 11/04/17 was Ct confirmed in sigmoid colon and she responded well to treatment at that time..   Symptoms include pain in lower adbomen, lower back. Also has some upper chest fullness sensation- when she burps she feels .  The bulk of her pain is in the left lower quadrant though.    She has done a great job drinking  water but hasn't done much in electrolyte department other than trying to do some soups like Progresso broth. Right calf pain- has been waking up with cramps in night and then sensation may get a cramp in the day. No leg swelling. No shortness of breath.  She also has some mild cramping in the left lower leg-she describes both as cramping sensation.  Only cramps in the daytime if palpated.  Since starting antibiotics- diarrhea has improved some from 6x a day to 4x a day. Sharp and worst pains are in the lower abdomen rates as sharp severe pain up to 8-9/10. No improvement since starting antibiotics.  A/P: 72 year old female with diverticulitis that is  improving on Cipro and Flagyl-still has significant tenderness in left lower quadrant.  I want to follow-up again on Friday at 1 PM with her.  She does appear ill-I wonder if some of this is from dehydration and electrolyte imbalances-will add Gatorade to her regimen.  I do not think her calf pain represents DVT-instead electrolyte abnormalities more likely.  With her current illness and inflammation I am concerned a d-dimer would result in a false positive.  She may have shifting pattern of illness though and we may have to consider DVT further.  No shortness of breath to suggest pulmonary embolism- chest fullness sensation resolves with burping/belching.  I am going to get some blood work on patient- we will get CRP and if highly elevated would point toward diverticulitis- as she is already been on antibiotics for 2 days may be lower yield.  Future Appointments  Date Time Provider [place]  08/15/2018  1:00 PM Marin Olp, MD [facility]  05/04/2019 11:00 AM [facility] HEALTH COACH [facility]   Lab/Order associations: Diverticulitis  LLQ abdominal pain - Plan: C-reactive protein, CBC with Differential/Platelet, Comprehensive metabolic panel   Time Stamp The duration of face-to-face time during this visit was greater than 15 minutes. Greater than 50% of this time was spent in counseling, explanation of diagnosis, planning of further management, and/or coordination of care including food and beverage discussion as well as electrolyte discussion- in addition to above discussions we discussed low fiber diet for now and then high-fiber diet after resolution to help prevent recurrence..    Return precautions advised.  Garret Reddish, MD

## 2018-08-13 NOTE — Patient Instructions (Signed)
Please stop by lab before you go  See me at 1 pm on Friday for recheck- for front desk- please open my schedule for that slot  If you have worsening calf pain or note swelling- please let me know ASAP  If you have shortness of breath also want to know ASAp  If you have worsening pain- same thing  I want you to try to do at least 2 full gatorade's per day. If you get a 32 oz can just do one but I think some of your issues are electrolyte loss related

## 2018-08-14 LAB — CBC WITH DIFFERENTIAL/PLATELET
BASOS PCT: 0.8 % (ref 0.0–3.0)
Basophils Absolute: 0 10*3/uL (ref 0.0–0.1)
Eosinophils Absolute: 0.2 10*3/uL (ref 0.0–0.7)
Eosinophils Relative: 3.3 % (ref 0.0–5.0)
HCT: 38.8 % (ref 36.0–46.0)
Hemoglobin: 13 g/dL (ref 12.0–15.0)
LYMPHS PCT: 12.7 % (ref 12.0–46.0)
Lymphs Abs: 0.7 10*3/uL (ref 0.7–4.0)
MCHC: 33.5 g/dL (ref 30.0–36.0)
MCV: 93.4 fl (ref 78.0–100.0)
MONO ABS: 0.8 10*3/uL (ref 0.1–1.0)
MONOS PCT: 14 % — AB (ref 3.0–12.0)
NEUTROS ABS: 4 10*3/uL (ref 1.4–7.7)
NEUTROS PCT: 69.2 % (ref 43.0–77.0)
Platelets: 218 10*3/uL (ref 150.0–400.0)
RBC: 4.15 Mil/uL (ref 3.87–5.11)
RDW: 13.6 % (ref 11.5–15.5)
WBC: 5.8 10*3/uL (ref 4.0–10.5)

## 2018-08-14 LAB — COMPREHENSIVE METABOLIC PANEL
ALK PHOS: 49 U/L (ref 39–117)
ALT: 22 U/L (ref 0–35)
AST: 23 U/L (ref 0–37)
Albumin: 4.8 g/dL (ref 3.5–5.2)
BILIRUBIN TOTAL: 0.3 mg/dL (ref 0.2–1.2)
BUN: 17 mg/dL (ref 6–23)
CALCIUM: 10.4 mg/dL (ref 8.4–10.5)
CO2: 25 meq/L (ref 19–32)
CREATININE: 1.28 mg/dL — AB (ref 0.40–1.20)
Chloride: 103 mEq/L (ref 96–112)
GFR: 43.44 mL/min — AB (ref >60.00)
Glucose, Bld: 80 mg/dL (ref 70–99)
Potassium: 4 mEq/L (ref 3.5–5.1)
SODIUM: 137 meq/L (ref 135–145)
Total Protein: 7.1 g/dL (ref 6.0–8.3)

## 2018-08-14 LAB — C-REACTIVE PROTEIN: CRP: 0.6 mg/dL (ref 0.5–20.0)

## 2018-08-15 ENCOUNTER — Ambulatory Visit: Payer: Medicare Other | Admitting: Family Medicine

## 2018-08-15 ENCOUNTER — Encounter: Payer: Self-pay | Admitting: Family Medicine

## 2018-08-15 VITALS — BP 102/62 | HR 70 | Temp 97.6°F | Ht 62.0 in | Wt 135.4 lb

## 2018-08-15 DIAGNOSIS — K5792 Diverticulitis of intestine, part unspecified, without perforation or abscess without bleeding: Secondary | ICD-10-CM | POA: Diagnosis not present

## 2018-08-15 DIAGNOSIS — I10 Essential (primary) hypertension: Secondary | ICD-10-CM

## 2018-08-15 NOTE — Progress Notes (Signed)
Subjective:  Caroline Peters is a 72 y.o. year old very pleasant female patient who presents for/with See problem oriented charting ROS- since last visit- still with diarrhea. No nausea or vomiting. No fever or chills. Still waith abdominal pain particularly in waves  Past Medical History-  Patient Active Problem List   Diagnosis Date Noted  . Insomnia 06/23/2014    Priority: Medium  . Essential hypertension, benign 03/03/2014    Priority: Medium  . Hyperlipidemia 12/11/2011    Priority: Medium  . Osteopenia     Priority: Medium  . Hypothyroid     Priority: Medium  . Recurrent major depression in partial remission ([place]) 04/16/2007    Priority: Medium  . History of adenomatous polyp of colon 05/20/2015    Priority: Low  . Hair loss 05/20/2015    Priority: Low  . HA (headache)     Priority: Low  . Melanoma     Priority: Low  . Diverticulitis 11/05/2017  . Ganglion of right wrist 03/27/2017  . Abnormal finding on diagnostic imaging of extremity 02/20/2017  . Adhesive capsulitis of left shoulder 02/13/2017  . 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 ALPRAZolam (XANAX) 0.5 MG tablet TAKE 1/2 TABLET TWICE DAILY AS NEEDED. 30 tablet 1  . Biotin 1000 MCG tablet Take 1,000 mcg by mouth daily.    . calcium carbonate (OSCAL) 1500 (600 Ca) MG TABS tablet Take 1 tablet by mouth daily.    . ciprofloxacin (CIPRO) 500 MG tablet Take 1 tablet (500 mg total) by mouth 2 (two) times daily for 10 days. 20 tablet 0  . levothyroxine (SYNTHROID, LEVOTHROID) 88 MCG tablet TAKE 1 TABLET ONCE DAILY. 90 tablet 3  . lisinopril (PRINIVIL,ZESTRIL) 40 MG tablet TAKE 1 TABLET EACH DAY. 90 tablet 0  . metroNIDAZOLE (FLAGYL) 500 MG tablet Take 1 tablet (500 mg total) by mouth 3 (three) times daily. 21 tablet 0  . rosuvastatin (CRESTOR) 20 MG tablet Take 1 tablet (20 mg total) by mouth daily. 90 tablet 3  .  sertraline (ZOLOFT) 100 MG tablet Take 1.5 tablets (150 mg total) by mouth daily. 135 tablet 2  . zolpidem (AMBIEN) 5 MG tablet TAKE 1/2 TO 1 TABLET AT BEDTIME AS NEEDED. 30 tablet 0   No current facility-administered medications for this visit.     Objective: BP 102/62 (BP Location: Left Arm, Patient Position: Sitting, Cuff Size: Normal)   Pulse 70   Temp 97.6 F (36.4 C) (Oral)   Ht '5\' 2"'  (1.575 m)   Wt 135 lb 6.4 oz (61.4 kg)   SpO2 96%   BMI 24.76 kg/m  Gen: NAD, resting comfortably- much less fatigued appearing than 2 days ago CV: RRR no murmurs rubs or gallops Lungs: CTAB no crackles, wheeze, rhonchi Abdomen: soft/mild diffuse tenderness- slightly more pronounced to left of midline but overall exam improved from 2 days ago/nondistended/normal bowel sounds. No rebound or guarding.  Ext: no edema Skin: warm, dry  Assessment/Plan:  Diverticulitis Hypertension S: Patient has been treated for diverticulitis since 08/11/2018 with ciprofloxacin and Flagyl-patient was seen 2 days ago and appeared very weak-diffuse cramping-minimal improvement in abdominal pain at that point so we wanted to do a recheck before the weekend. Pain is [place] is Dr. Yong Channel Ms. Kabat had a good I thought of 2 things Today, patient states she feels like a new person.  She continues to have some diarrhea and has had  3 episodes today and has some intermittent abdominal pain but much improved overall.  She feels the Gatorade was particularly helpful-she feels much less lightheaded and her cramping has resolved.pain shifting to lower abdomen on both sides, also with some back pain but improving.Has noted urine is darker,  Still getting some bouts of pain such as woke her up twice this morning- 2 30 and 6 30 AM. Best night she has had so far.   She has noted somewhat dark urine during this time when she started looking.  Even before diverticulitis-bowel movements soft and sink for quite some time. 2x a day.   Patient  did have a slightly elevated creatinine and decreasing GFR on labs last visit.  She does not want to do blood work today.  We reviewed her blood pressure medicines today- lisinopril can be tough on the kidneys when dehydrated.  A/P: Diverticulitis appears to be improving.  Recommended repeat be met today but she declined-agrees to come back on Monday for this.  I am going to have her hold her lisinopril until that time-if kidney function bounces back up she may restart or if home blood pressures get above 150-I really think she can tolerate this looking at her blood pressure today at 102/62.  We discussed possibilities of things such as kidney stones but would be more odd to have concurrent illness causing diarrhea and kidney stones at the same time-if fails to improve then could consider abdominal imaging.  Future Appointments  Date Time Provider [place]  08/18/2018  9:00 AM [facility]  05/04/2019 11:00 AM [facility] HEALTH COACH [facility]   Lab/Order associations: Diverticulitis - Plan: Basic metabolic panel  Return precautions advised.  Garret Reddish, MD

## 2018-08-15 NOTE — Patient Instructions (Addendum)
Schedule visit for lab for Monday sometime so we can recheck your kidney function  You look a lot better today- I am very encouraged by your progress- keep going with the antibiotics-for diet you can slowly progress but really try to listen to your stomach.   Once this is completely cleared a high fiber diet does help prevent recurrence but not 100% of the time.

## 2018-08-18 ENCOUNTER — Encounter: Payer: Self-pay | Admitting: Family Medicine

## 2018-08-18 ENCOUNTER — Ambulatory Visit (INDEPENDENT_AMBULATORY_CARE_PROVIDER_SITE_OTHER): Payer: Medicare Other | Admitting: Family Medicine

## 2018-08-18 ENCOUNTER — Telehealth: Payer: Self-pay | Admitting: Family Medicine

## 2018-08-18 ENCOUNTER — Other Ambulatory Visit: Payer: Medicare Other

## 2018-08-18 ENCOUNTER — Ambulatory Visit (HOSPITAL_BASED_OUTPATIENT_CLINIC_OR_DEPARTMENT_OTHER)
Admission: RE | Admit: 2018-08-18 | Discharge: 2018-08-18 | Disposition: A | Discharge status: Home / Self Care | Payer: Medicare Other | Source: Ambulatory Visit | Attending: Family Medicine | Admitting: Family Medicine

## 2018-08-18 VITALS — BP 140/76 | HR 68 | Temp 98.3°F | Ht 62.0 in | Wt 135.0 lb

## 2018-08-18 DIAGNOSIS — R1032 Left lower quadrant pain: Secondary | ICD-10-CM | POA: Insufficient documentation

## 2018-08-18 DIAGNOSIS — R1013 Epigastric pain: Secondary | ICD-10-CM | POA: Diagnosis not present

## 2018-08-18 DIAGNOSIS — K760 Fatty (change of) liver, not elsewhere classified: Secondary | ICD-10-CM | POA: Diagnosis not present

## 2018-08-18 DIAGNOSIS — K5730 Diverticulosis of large intestine without perforation or abscess without bleeding: Secondary | ICD-10-CM | POA: Diagnosis not present

## 2018-08-18 LAB — POC URINALSYSI DIPSTICK (AUTOMATED)
Bilirubin, UA: NEGATIVE
Blood, UA: NEGATIVE
Glucose, UA: NEGATIVE
KETONES UA: NEGATIVE
Leukocytes, UA: NEGATIVE
Nitrite, UA: NEGATIVE
Protein, UA: NEGATIVE
Spec Grav, UA: 1.02 (ref 1.010–1.025)
Urobilinogen, UA: 0.2 E.U./dL
pH, UA: 6 (ref 5.0–8.0)

## 2018-08-18 LAB — CBC WITH DIFFERENTIAL/PLATELET
BASOS PCT: 1.4 % (ref 0.0–3.0)
Basophils Absolute: 0.1 10*3/uL (ref 0.0–0.1)
EOS PCT: 3.4 % (ref 0.0–5.0)
Eosinophils Absolute: 0.2 10*3/uL (ref 0.0–0.7)
HCT: 41 % (ref 36.0–46.0)
Hemoglobin: 13.7 g/dL (ref 12.0–15.0)
LYMPHS ABS: 0.7 10*3/uL (ref 0.7–4.0)
Lymphocytes Relative: 14.5 % (ref 12.0–46.0)
MCHC: 33.5 g/dL (ref 30.0–36.0)
MCV: 93.3 fl (ref 78.0–100.0)
MONO ABS: 0.6 10*3/uL (ref 0.1–1.0)
MONOS PCT: 13.4 % — AB (ref 3.0–12.0)
Neutro Abs: 3 10*3/uL (ref 1.4–7.7)
Neutrophils Relative %: 67.3 % (ref 43.0–77.0)
Platelets: 209 10*3/uL (ref 150.0–400.0)
RBC: 4.39 Mil/uL (ref 3.87–5.11)
RDW: 13.6 % (ref 11.5–15.5)
WBC: 4.5 10*3/uL (ref 4.0–10.5)

## 2018-08-18 LAB — COMPREHENSIVE METABOLIC PANEL
ALBUMIN: 4.8 g/dL (ref 3.5–5.2)
ALT: 17 U/L (ref 0–35)
AST: 19 U/L (ref 0–37)
Alkaline Phosphatase: 45 U/L (ref 39–117)
BUN: 10 mg/dL (ref 6–23)
CO2: 30 mEq/L (ref 19–32)
Calcium: 10.5 mg/dL (ref 8.4–10.5)
Chloride: 100 mEq/L (ref 96–112)
Creatinine, Ser: 0.98 mg/dL (ref 0.40–1.20)
GFR: 59.12 mL/min — ABNORMAL LOW (ref >60.00)
Glucose, Bld: 95 mg/dL (ref 70–99)
Potassium: 3.7 mEq/L (ref 3.5–5.1)
Sodium: 138 mEq/L (ref 135–145)
TOTAL PROTEIN: 6.8 g/dL (ref 6.0–8.3)
Total Bilirubin: 0.4 mg/dL (ref 0.2–1.2)

## 2018-08-18 LAB — AMYLASE: AMYLASE: 42 U/L (ref 27–131)

## 2018-08-18 LAB — LIPASE: Lipase: 29 U/L (ref 11.0–59.0)

## 2018-08-18 MED ORDER — IOPAMIDOL (ISOVUE-300) INJECTION 61%
100.0000 mL | Freq: Once | INTRAVENOUS | Status: AC | PRN
  Administered 2018-08-18: 100 mL via INTRAVENOUS

## 2018-08-18 NOTE — Progress Notes (Signed)
Subjective:  Caroline Peters is a 72 y.o. year old very pleasant female patient who presents for/with See problem oriented charting ROS- nausea, dry heaves, worsening abdominal pain.  No fever or chills.   Past Medical History-  Patient Active Problem List   Diagnosis Date Noted  . Insomnia 06/23/2014    Priority: Medium  . Essential hypertension, benign 03/03/2014    Priority: Medium  . Hyperlipidemia 12/11/2011    Priority: Medium  . Osteopenia     Priority: Medium  . Hypothyroid     Priority: Medium  . Recurrent major depression in partial remission ([place]) 04/16/2007    Priority: Medium  . History of adenomatous polyp of colon 05/20/2015    Priority: Low  . Hair loss 05/20/2015    Priority: Low  . HA (headache)     Priority: Low  . Melanoma     Priority: Low  . Diverticulitis 11/05/2017  . Ganglion of right wrist 03/27/2017  . Abnormal finding on diagnostic imaging of extremity 02/20/2017  . Adhesive capsulitis of left shoulder 02/13/2017  . 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 ALPRAZolam (XANAX) 0.5 MG tablet TAKE 1/2 TABLET TWICE DAILY AS NEEDED. 30 tablet 1  . Biotin 1000 MCG tablet Take 1,000 mcg by mouth daily.    . calcium carbonate (OSCAL) 1500 (600 Ca) MG TABS tablet Take 1 tablet by mouth daily.    . ciprofloxacin (CIPRO) 500 MG tablet Take 1 tablet (500 mg total) by mouth 2 (two) times daily for 10 days. 20 tablet 0  . levothyroxine (SYNTHROID, LEVOTHROID) 88 MCG tablet TAKE 1 TABLET ONCE DAILY. 90 tablet 3  . lisinopril (PRINIVIL,ZESTRIL) 40 MG tablet TAKE 1 TABLET EACH DAY. 90 tablet 0  . metroNIDAZOLE (FLAGYL) 500 MG tablet Take 1 tablet (500 mg total) by mouth 3 (three) times daily. 21 tablet 0  . rosuvastatin (CRESTOR) 20 MG tablet Take 1 tablet (20 mg total) by mouth daily. 90 tablet 3  . sertraline (ZOLOFT) 100 MG tablet Take 1.5 tablets (150 mg total) by  mouth daily. 135 tablet 2  . zolpidem (AMBIEN) 5 MG tablet TAKE 1/2 TO 1 TABLET AT BEDTIME AS NEEDED. 30 tablet 0   No current facility-administered medications for this visit.     Objective: BP 140/76 (BP Location: Left Arm, Patient Position: Sitting, Cuff Size: Large)   Pulse 68   Temp 98.3 F (36.8 C) (Oral)   Ht 5\' 2"  (1.575 m)   Wt 135 lb (61.2 kg)   SpO2 97%   BMI 24.69 kg/m  Gen: NAD, resting comfortably CV: RRR no murmurs rubs or gallops Lungs: CTAB no crackles, wheeze, rhonchi Abdomen: soft/severe tenderness in epigastric and LUQ with some guarding but no rebound/nondistended/normal bowel sounds.  Skin: warm, dry  Assessment/Plan:  Left lower quadrant abdominal pain - Plan: CBC with Differential/Platelet, CT Abdomen Pelvis W Contrast, Comprehensive metabolic panel, POCT Urinalysis Dipstick (Automated)  Epigastric abdominal pain - Plan: Amylase, Lipase, Comprehensive metabolic panel S: Patient seen for diverticulitis 08/11/18- treated with cipro and flagyl and was improving on 12/11 and 12/13.   Then over weekend pain shifted and worsened. Our nurse triage line recommended ER visit. Apparently a neighbor is a retired Psychologist, sport and exercise and recommended she be admitted by her primary doctor instead of go to ER.    pain has shift originally from LLQ then more suprapubic now over weekend has shifted to 3 fingers above  umbilicus but then will rotate back down to LLQ. Diarrhea has now resolved. Very nauseated, burping. Pain was intermittent on Friday and now has become more persistent- pain up to 10/10.   Urine was almost tea colored yesterday morning and lightened through the days. Has been able to keep down fluids. No vomiting but has had some dry heaves.   Has 10 days of cipro per Aldona Bar and 7 days flagyl- she just finished flagyl last night  Took 1 dose of lisinopril this morning but held doses over weekend- we had told her to hold due to poor PO previously.  A/P: 72 year old woman  with worsening LLQ pain over weekend after initial improvement now with pain into epigastric area. Will need stat labs and CT scan- ruptured diverticulum? Kidney stone? She asks for outpatient workup though we discussed sending to ER- she declines for now- she asks that I try to get her admitted if at all possible if needed- we will see once workup completed.   From AVS:  " Worsening abdominal pain despite treatment for diverticulitis  We need stat labs and stat CT scan  If your symptoms worsen- need to go to emergency room. May still have to send to hospital depending on results.   Sorry you are not feeling better  "  Already had lisinopril today- would normally hold this dose but will see how labs show today- hopeful kidney function maintained.   Future Appointments  Date Time Provider [place]  05/04/2019 11:00 AM [facility] HEALTH COACH [facility]   Lab/Order associations: Left lower quadrant abdominal pain - Plan: CBC with Differential/Platelet, CT Abdomen Pelvis W Contrast, Comprehensive metabolic panel, POCT Urinalysis Dipstick (Automated)  Epigastric abdominal pain - Plan: Amylase, Lipase, Comprehensive metabolic panel  Return precautions advised.  Garret Reddish, MD

## 2018-08-18 NOTE — Patient Instructions (Addendum)
Worsening abdominal pain despite treatment for diverticulitis  We need stat labs and stat CT scan  If your symptoms worsen- need to go to emergency room. May still have to send to hospital depending on results.   Sorry you are not feeling better

## 2018-08-18 NOTE — Telephone Encounter (Signed)
Copied from [place] [id number]. Topic: General - Other >> Aug 18, 2018  5:14 PM Cecelia Byars, NT wrote: Reason for CRM: Patient is calling and would like a return call with results from a CT scan that was done today , the patient is very anxious

## 2018-08-18 NOTE — Addendum Note (Signed)
Addended by: Ezequiel Kayser T on: 08/18/2018 10:06 AM   Modules accepted: Orders

## 2018-08-19 NOTE — Telephone Encounter (Signed)
See note

## 2018-08-19 NOTE — Telephone Encounter (Signed)
Per results notes from CT - Notes recorded by [facility], Lonia Mad, LPN on 87/19/5974 at 9:51 AM EST Called and spoke with patient who verbalized understanding

## 2018-08-20 MED ORDER — INSULIN NPH HUMAN RECOMB 100 UNIT/ML INJECTION
100 unit/mL | SUBCUTANEOUS | 11 refills | Status: AC
Start: 2018-08-20 — End: ?

## 2018-08-21 ENCOUNTER — Other Ambulatory Visit: Payer: Self-pay | Admitting: Family Medicine

## 2018-09-16 ENCOUNTER — Encounter: Attending: Internal Medicine | Primary: Internal Medicine

## 2018-10-03 DIAGNOSIS — K5790 Diverticulosis of intestine, part unspecified, without perforation or abscess without bleeding: Secondary | ICD-10-CM | POA: Diagnosis not present

## 2018-10-03 DIAGNOSIS — R197 Diarrhea, unspecified: Secondary | ICD-10-CM | POA: Diagnosis not present

## 2018-10-10 DIAGNOSIS — H2513 Age-related nuclear cataract, bilateral: Secondary | ICD-10-CM | POA: Diagnosis not present

## 2018-10-10 DIAGNOSIS — Z6824 Body mass index (BMI) 24.0-24.9, adult: Secondary | ICD-10-CM | POA: Diagnosis not present

## 2018-10-10 DIAGNOSIS — H6120 Impacted cerumen, unspecified ear: Secondary | ICD-10-CM | POA: Diagnosis not present

## 2018-10-15 ENCOUNTER — Ambulatory Visit: Payer: Medicare Other | Admitting: Nurse Practitioner

## 2018-10-15 ENCOUNTER — Encounter: Payer: Self-pay | Admitting: Nurse Practitioner

## 2018-10-15 ENCOUNTER — Encounter (INDEPENDENT_AMBULATORY_CARE_PROVIDER_SITE_OTHER): Payer: Self-pay

## 2018-10-15 VITALS — BP 124/60 | HR 73 | Ht 61.0 in | Wt 132.8 lb

## 2018-10-15 DIAGNOSIS — R197 Diarrhea, unspecified: Secondary | ICD-10-CM

## 2018-10-15 DIAGNOSIS — R1030 Lower abdominal pain, unspecified: Secondary | ICD-10-CM | POA: Diagnosis not present

## 2018-10-15 NOTE — Progress Notes (Addendum)
Chief Complaint:  Diarrhea, abdominal pain     IMPRESSION and PLAN:     38.  73 year old female with lower abdominal discomfort and diarrhea since mid December.  Initially had associated nausea and vomiting, this quickly subsided.  Unremarkable labs by PCP late January, stool for C. difficile negative.  On 10/06/2018 PCP started vancomycin empirically for C. difficile.  He also started probiotics.  Her symptoms have significantly improved since.  Now having some solid stools -Continue probiotics for an additional 2 months -She is nearly done with a 10-day course of vancomycin -Given the significant improvement in diarrhea and lower abdominal pain with the vancomycin and probiotics, will hold off on further GI work-up.  I have advised the patient to call our office if by 3-4 weeks bowel movements have not returned to normal or if pain persist.  Will certainly call us should symptoms worsen in the interim  2. History of colon polyps.  She is status post laparoscopic appendectomy and partial septectomy March 2017 for sessile serrated polyp involving appendix .  Follow-up colonoscopy May 2018- for recurrent polyps .  She is due for surveillance colonoscopy May 2023   Addendum: Reviewed and agree with assessment and management plan. Pyrtle, Lajuan Lines, MD    HPI:     Patient is a 73 year old female known to Dr. Hilarie Fredrickson for a history of colon polyps. .  She had a polyp surveillance colonoscopy with him in January 2017.  The exam was complete, bowel prep characterized as good.  A sessile polyp was removed from the cecum, a sessile polyp removed from the sigmoid.  There was a questionable polyp appearing to originate from within the appendiceal orifice, multiple biopsies were taken.  She had mild diverticulosis of the left colon.  Biopsies from the described appendiceal orifice area returned a sessile serrated polyp without dysplasia.  Patient was referred for appendectomy since complete resection  could not be ensured.  Patient was asked to return for recall colonoscopy in 1 year.  She underwent laparoscopic appendectomy and partial cecectomy by Dr. Zella Richer March 2017 . Appendix pathology showed sessile serrated polyp, no malignancy.  Resection margins negative for lesion. As requested patient return for a follow-up colonoscopy May 2018.  No recurrent polyps found.  Due for surveillance colonoscopy May 2023  Patient now referred by PCP, Dr. Joylene Draft for evaluation of diarrhea.  Her problem started mid December with nausea, vomiting, lower abdominal pain and diarrhea.  Diarrhea was nonbloody and it occurred mainly during the daytime.  She had no associated fevers . She was treated by Dr. Yong Channel for possible diverticulitis.  Except for the nausea and vomiting, her symptoms did not improve.  She saw Dr. Joylene Draft the end of January at which time workup revealed C. difficile negative stool.  She does not recall taking any antibiotics in the weeks or months prior to the onset of symptoms.  Labs late Jan revealed normal WBC , normal hemoglobin of 13.3.  Normal TSH.  PCP started probiotics and vancomycin empirically on 10/06/2018.  Since then the diarrhea and lower abdominal pain have significantly improved.  Actually having some solid stools now.  She is averaging about 3 bowel movements a day.  The lower abdominal pain is mainly RLQ.  It is very transient lasting only seconds.  No urinary symptoms, no vaginal symptoms.   Review of systems:     No chest pain, no SOB, no fevers, no urinary sx   Past Medical History:  Diagnosis Date  . Anxiety   . Cancer ([place])    Melanoma- on arm age 22   . DEPRESSION 04/16/2007  . Fibroadenoma of breast   . HA (headache)    hx of   . Hyperlipidemia   . Hypertension   . Hypothyroid   . HYPOTHYROIDISM 04/16/2007  . Kidney stone   . Osteopenia 02/2018   T score -1.9 FRAX 10% / 1.4% stable from prior DEXA.    Patient's surgical history, family medical history, social  history, medications and allergies were all reviewed in Epic   Creatinine clearance cannot be calculated (Patient's most recent lab result is older than the maximum 21 days allowed.)  Current Outpatient Medications  Medication Sig Dispense Refill  . ALPRAZolam (XANAX) 0.5 MG tablet TAKE 1/2 TABLET TWICE DAILY AS NEEDED. 30 tablet 1  . Biotin 1000 MCG tablet Take 1,000 mcg by mouth daily.    . calcium carbonate (OSCAL) 1500 (600 Ca) MG TABS tablet Take 1 tablet by mouth daily.    Marland Kitchen levothyroxine (SYNTHROID, LEVOTHROID) 88 MCG tablet TAKE 1 TABLET ONCE DAILY. 90 tablet 3  . lisinopril (PRINIVIL,ZESTRIL) 40 MG tablet TAKE 1 TABLET EACH DAY. 90 tablet 0  . rosuvastatin (CRESTOR) 20 MG tablet Take 1 tablet (20 mg total) by mouth daily. 90 tablet 3  . sertraline (ZOLOFT) 100 MG tablet Take 1.5 tablets (150 mg total) by mouth daily. 135 tablet 2  . vancomycin (VANCOCIN) 125 MG capsule Take 125 mg by mouth 4 (four) times daily.    Marland Kitchen zolpidem (AMBIEN) 5 MG tablet TAKE 1/2 TO 1 TABLET AT BEDTIME AS NEEDED. 30 tablet 5   No current facility-administered medications for this visit.     Physical Exam:     BP 124/60   Pulse 73   Ht 5\' 1"  (1.549 m)   Wt 132 lb 12.8 oz (60.2 kg)   BMI 25.09 kg/m   GENERAL:  Pleasant female in NAD PSYCH: : Cooperative, normal affect EENT:  conjunctiva pink, mucous membranes moist, neck supple without masses CARDIAC:  RRR, no murmur heard, no peripheral edema PULM: Normal respiratory effort, lungs CTA bilaterally, no wheezing ABDOMEN:  Nondistended, soft, mild RLQ tenderness. No obvious masses, no hepatomegaly,  normal bowel sounds SKIN:  turgor, no lesions seen Musculoskeletal:  Normal muscle tone, normal strength NEURO: Alert and oriented x 3, no focal neurologic deficits   Tye Savoy , NP 10/15/2018, 10:27 AM Cc:  Crist Infante, MD

## 2018-10-15 NOTE — Patient Instructions (Signed)
If you are age 74 or older, your body mass index should be between 23-30. Your Body mass index is 25.09 kg/m. If this is out of the aforementioned range listed, please consider follow up with your Primary Care Provider.  If you are age 59 or younger, your body mass index should be between 19-25. Your Body mass index is 25.09 kg/m. If this is out of the aformentioned range listed, please consider follow up with your Primary Care Provider.   Call if bowel movements are not back to normal in 3-4 weeks.  Call sooner if pain/diarrhea gets worse.  Thank you for choosing me and [facility].   Tye Savoy, NP

## 2018-10-23 DIAGNOSIS — I10 Essential (primary) hypertension: Secondary | ICD-10-CM | POA: Diagnosis not present

## 2018-10-23 DIAGNOSIS — E7849 Other hyperlipidemia: Secondary | ICD-10-CM | POA: Diagnosis not present

## 2018-10-23 DIAGNOSIS — R197 Diarrhea, unspecified: Secondary | ICD-10-CM | POA: Diagnosis not present

## 2018-10-23 DIAGNOSIS — Z6824 Body mass index (BMI) 24.0-24.9, adult: Secondary | ICD-10-CM | POA: Diagnosis not present

## 2018-12-08 DIAGNOSIS — R05 Cough: Secondary | ICD-10-CM | POA: Diagnosis not present

## 2018-12-08 DIAGNOSIS — J029 Acute pharyngitis, unspecified: Secondary | ICD-10-CM | POA: Diagnosis not present

## 2018-12-08 DIAGNOSIS — J019 Acute sinusitis, unspecified: Secondary | ICD-10-CM | POA: Diagnosis not present

## 2018-12-16 NOTE — Telephone Encounter (Signed)
Left message for a return phone call.

## 2018-12-16 NOTE — Telephone Encounter (Signed)
-----   Message from Rolland Bimler sent at 12/16/2018 11:02 AM EDT -----  Regarding: Dr. Luellen Pucker  Pt would like a call back regarding getting refills on her meds. Contact is [phone]

## 2018-12-17 MED ORDER — OZEMPIC 1 MG/DOSE (2 MG/1.5 ML) SUBCUTANEOUS PEN INJECTOR
1 mg/dose (2 mg/.5 mL) | SUBCUTANEOUS | 5 refills | Status: DC
Start: 2018-12-17 — End: 2018-12-27

## 2018-12-17 NOTE — Telephone Encounter (Signed)
You can let her know this was taken care of.

## 2018-12-17 NOTE — Telephone Encounter (Signed)
Jamie Park is a former patient of Dr. Patsey Berthold and would like a refill on her Ozempic. Last office note written by Dr. Adriana Simas (06/03/2018)  states that she is to take 0.5 mg weekly by counting 37 clicks.     Informed patient that I will have someone contact her on Friday when we return to the office to set up an appointment with another provider at our office.

## 2018-12-17 NOTE — Telephone Encounter (Signed)
-----   Message from Frances Furbish sent at 12/17/2018  3:22 PM EDT -----  Regarding: Dr.Johnson/Telephone  Contact: [phone]  Pt returning missed call from [place]. Pt is a former pt of Dr. Adriana Simas. Pt's best contact number is [phone] (after 5pm).

## 2018-12-27 MED ORDER — OZEMPIC 1 MG/DOSE (2 MG/1.5 ML) SUBCUTANEOUS PEN INJECTOR
1 mg/dose (2 mg/.5 mL) | SUBCUTANEOUS | 5 refills | Status: AC
Start: 2018-12-27 — End: ?

## 2019-01-08 ENCOUNTER — Telehealth: Payer: Self-pay | Admitting: Family Medicine

## 2019-01-08 NOTE — Telephone Encounter (Signed)
Left message for pt to return phone call. Pt needs a f/u visit.

## 2019-01-12 NOTE — Telephone Encounter (Signed)
Pt PCP has changed. PCP updated! No futher action needed!

## 2019-02-02 DIAGNOSIS — Z0120 Encounter for dental examination and cleaning without abnormal findings: Secondary | ICD-10-CM | POA: Diagnosis not present

## 2019-02-10 DIAGNOSIS — L820 Inflamed seborrheic keratosis: Secondary | ICD-10-CM | POA: Diagnosis not present

## 2019-02-10 DIAGNOSIS — Z85820 Personal history of malignant melanoma of skin: Secondary | ICD-10-CM | POA: Diagnosis not present

## 2019-03-14 DIAGNOSIS — Z1231 Encounter for screening mammogram for malignant neoplasm of breast: Secondary | ICD-10-CM | POA: Diagnosis not present

## 2019-03-14 DIAGNOSIS — Z803 Family history of malignant neoplasm of breast: Secondary | ICD-10-CM | POA: Diagnosis not present

## 2019-04-01 DIAGNOSIS — E785 Hyperlipidemia, unspecified: Secondary | ICD-10-CM | POA: Diagnosis not present

## 2019-04-01 DIAGNOSIS — E038 Other specified hypothyroidism: Secondary | ICD-10-CM | POA: Diagnosis not present

## 2019-04-01 DIAGNOSIS — E039 Hypothyroidism, unspecified: Secondary | ICD-10-CM | POA: Diagnosis not present

## 2019-04-01 DIAGNOSIS — E7849 Other hyperlipidemia: Secondary | ICD-10-CM | POA: Diagnosis not present

## 2019-04-01 DIAGNOSIS — R42 Dizziness and giddiness: Secondary | ICD-10-CM | POA: Diagnosis not present

## 2019-04-01 DIAGNOSIS — R7301 Impaired fasting glucose: Secondary | ICD-10-CM | POA: Diagnosis not present

## 2019-04-01 DIAGNOSIS — I10 Essential (primary) hypertension: Secondary | ICD-10-CM | POA: Diagnosis not present

## 2019-04-13 DIAGNOSIS — I10 Essential (primary) hypertension: Secondary | ICD-10-CM | POA: Diagnosis not present

## 2019-04-13 DIAGNOSIS — H6691 Otitis media, unspecified, right ear: Secondary | ICD-10-CM | POA: Diagnosis not present

## 2019-04-13 DIAGNOSIS — R42 Dizziness and giddiness: Secondary | ICD-10-CM | POA: Diagnosis not present

## 2019-05-04 ENCOUNTER — Ambulatory Visit: Payer: Medicare Other

## 2019-06-10 ENCOUNTER — Encounter: Payer: Self-pay | Admitting: Gynecology

## 2019-07-02 DIAGNOSIS — I10 Essential (primary) hypertension: Secondary | ICD-10-CM | POA: Diagnosis not present

## 2019-07-02 DIAGNOSIS — N183 Chronic kidney disease, stage 3 unspecified: Secondary | ICD-10-CM | POA: Diagnosis not present

## 2019-07-02 DIAGNOSIS — E038 Other specified hypothyroidism: Secondary | ICD-10-CM | POA: Diagnosis not present

## 2019-07-07 DIAGNOSIS — E039 Hypothyroidism, unspecified: Secondary | ICD-10-CM | POA: Diagnosis not present

## 2019-07-07 DIAGNOSIS — N1831 Chronic kidney disease, stage 3a: Secondary | ICD-10-CM | POA: Diagnosis not present

## 2019-07-07 DIAGNOSIS — I10 Essential (primary) hypertension: Secondary | ICD-10-CM | POA: Diagnosis not present

## 2019-07-07 DIAGNOSIS — F329 Major depressive disorder, single episode, unspecified: Secondary | ICD-10-CM | POA: Diagnosis not present

## 2019-08-03 DIAGNOSIS — L650 Telogen effluvium: Secondary | ICD-10-CM | POA: Diagnosis not present

## 2019-08-03 DIAGNOSIS — Z85820 Personal history of malignant melanoma of skin: Secondary | ICD-10-CM | POA: Diagnosis not present

## 2019-08-04 DIAGNOSIS — Z0120 Encounter for dental examination and cleaning without abnormal findings: Secondary | ICD-10-CM | POA: Diagnosis not present

## 2019-09-22 ENCOUNTER — Ambulatory Visit: Payer: Medicare Other | Attending: Internal Medicine

## 2019-09-22 DIAGNOSIS — Z23 Encounter for immunization: Secondary | ICD-10-CM | POA: Insufficient documentation

## 2019-09-22 NOTE — Progress Notes (Signed)
   Covid-19 Vaccination Clinic  Name:  Caroline Peters    MRN: [id number] DOB: 1945/12/29  09/22/2019  Caroline Peters was observed post Covid-19 immunization for 15 minutes without incidence. She was provided with Vaccine Information Sheet and instruction to access the V-Safe system.   Caroline Peters was instructed to call 911 with any severe reactions post vaccine: Marland Kitchen Difficulty breathing  . Swelling of your face and throat  . A fast heartbeat  . A bad rash all over your body  . Dizziness and weakness    Immunizations Administered    Name Date Dose VIS Date Route   Pfizer COVID-19 Vaccine 09/22/2019  1:21 PM 0.3 mL 08/14/2019 Intramuscular   Manufacturer: [employer]   Lot: [id number]   [place]: [id number]

## 2019-09-24 NOTE — Telephone Encounter (Signed)
Please send a fax to [facility] and have them stop sending refills for her NPH insulin.  she is no longer under Dr. Patsey Berthold care since he left our practice on 09/21/18 and she will need to contact her PCP or new endocrinologist for further refills of this medication.

## 2019-09-28 DIAGNOSIS — E7849 Other hyperlipidemia: Secondary | ICD-10-CM | POA: Diagnosis not present

## 2019-09-28 DIAGNOSIS — I10 Essential (primary) hypertension: Secondary | ICD-10-CM | POA: Diagnosis not present

## 2019-09-28 DIAGNOSIS — L309 Dermatitis, unspecified: Secondary | ICD-10-CM | POA: Diagnosis not present

## 2019-09-28 DIAGNOSIS — Z0000 Encounter for general adult medical examination without abnormal findings: Secondary | ICD-10-CM | POA: Diagnosis not present

## 2019-09-28 DIAGNOSIS — E039 Hypothyroidism, unspecified: Secondary | ICD-10-CM | POA: Diagnosis not present

## 2019-09-28 DIAGNOSIS — Z85820 Personal history of malignant melanoma of skin: Secondary | ICD-10-CM | POA: Diagnosis not present

## 2019-09-28 DIAGNOSIS — L245 Irritant contact dermatitis due to other chemical products: Secondary | ICD-10-CM | POA: Diagnosis not present

## 2019-09-28 NOTE — Telephone Encounter (Addendum)
Faxed and confirmation pending.        Confirmation received.

## 2019-09-30 IMAGING — DX DG CHEST 2V
2 series · 2 of 2 positions shown · non-contrast
Comparison: 11/07/2009

CLINICAL DATA: Nonproductive cough for 1 week

EXAM:
CHEST  2 VIEW

[chest pa]
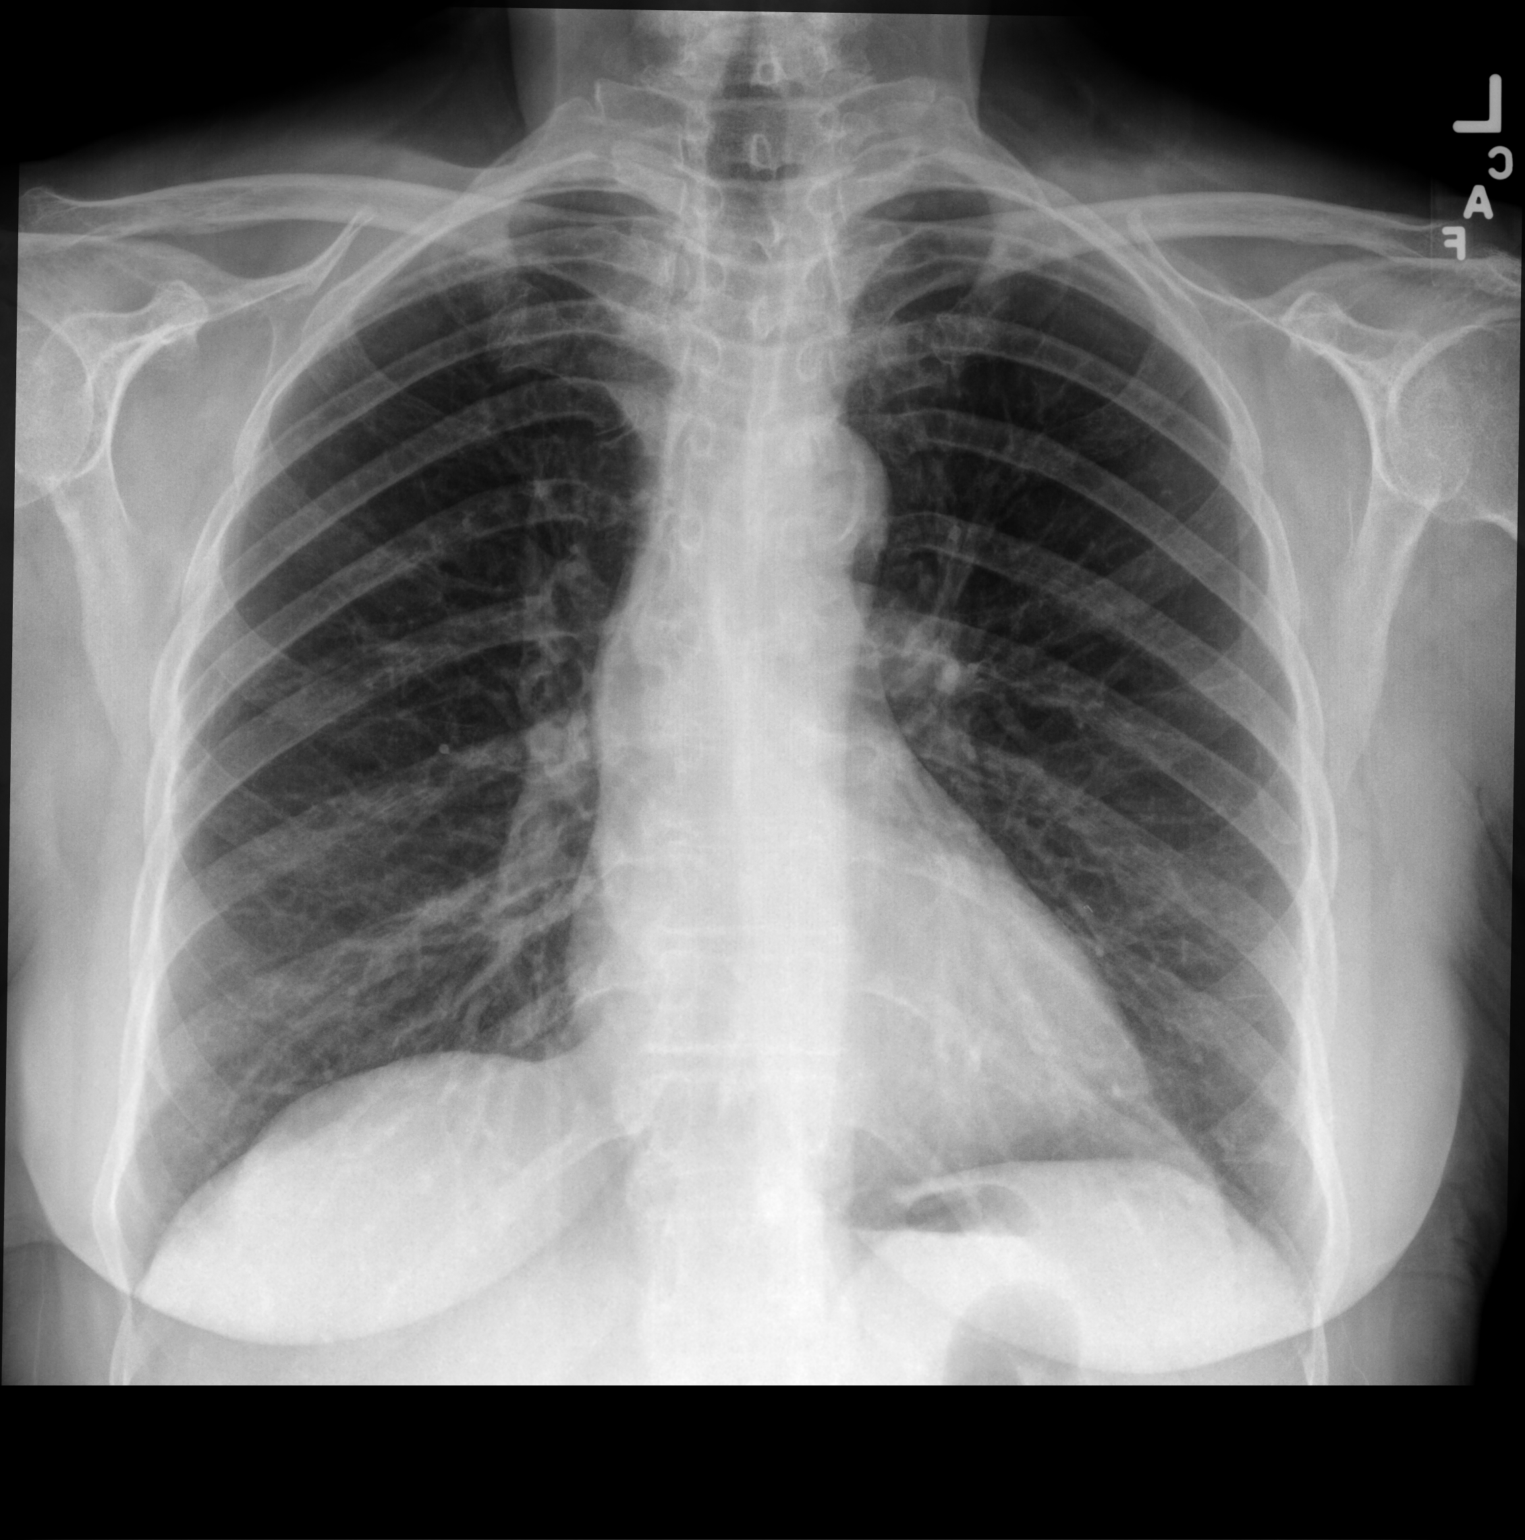

[chest lat]
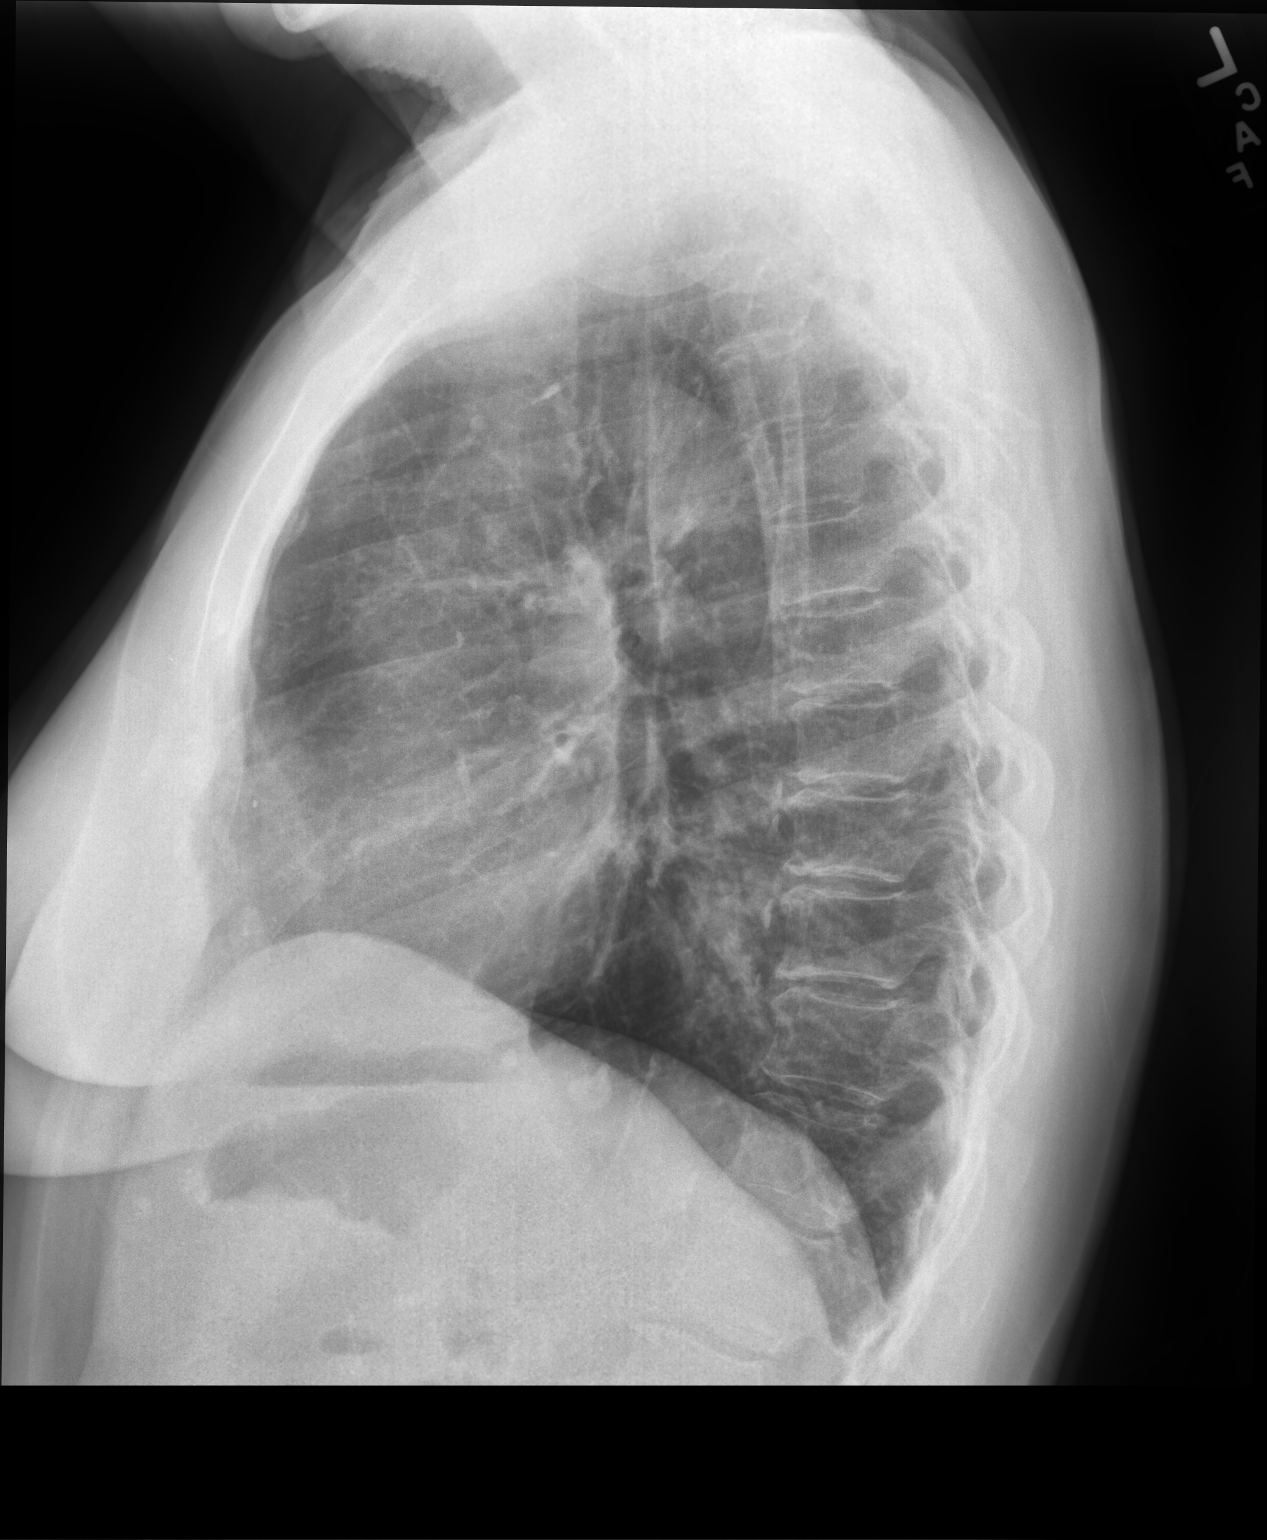

[2 of 2 positions shown; findings below may reference images not displayed]

FINDINGS: The heart size and mediastinal contours are within normal limits.
Both lungs are clear. The visualized skeletal structures are
unremarkable.
IMPRESSION: No active cardiopulmonary disease.

## 2019-09-30 IMAGING — CT CT ABD-PELV W/ CM
2 of 5 series · 15 of 46 positions shown, 17 images · IV contrast (ISOVUE 300)
Comparison: None.

CLINICAL DATA: Low abdomen pain with diarrhea and fever for 1 week

EXAM:
CT ABDOMEN AND PELVIS WITH CONTRAST
TECHNIQUE: Multidetector CT imaging of the abdomen and pelvis was performed
using the standard protocol following bolus administration of
intravenous contrast.
CONTRAST:  100mL BEDBDN-IWW IOPAMIDOL (BEDBDN-IWW) INJECTION 61%

[Series 2: abd/pel w · axial · 0.72mm/px · z∈[-373,-28]mm · 12 of 79 slices shown, 14 images]
[im 5/79  soft-tissue]
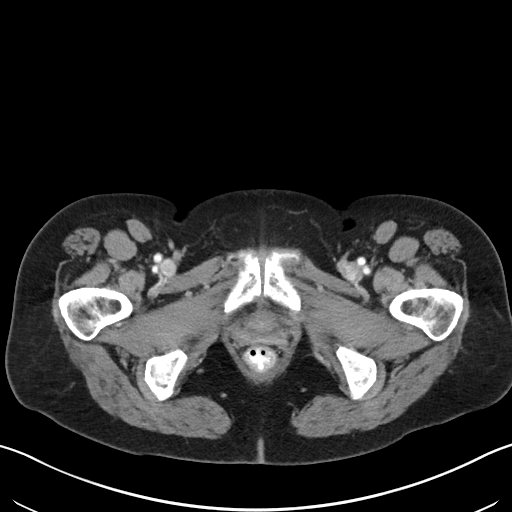
[im 5/79  bone]
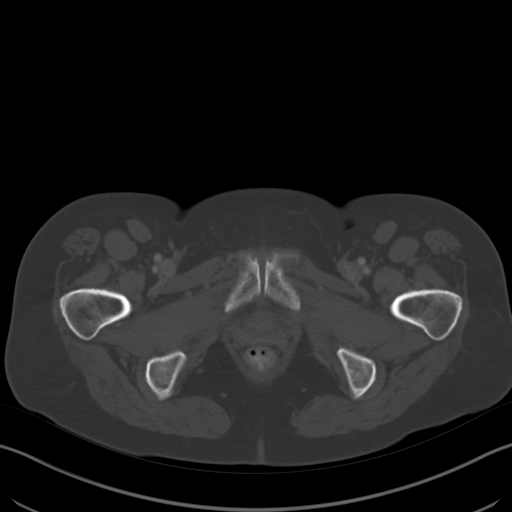
[im 13/79  soft-tissue]
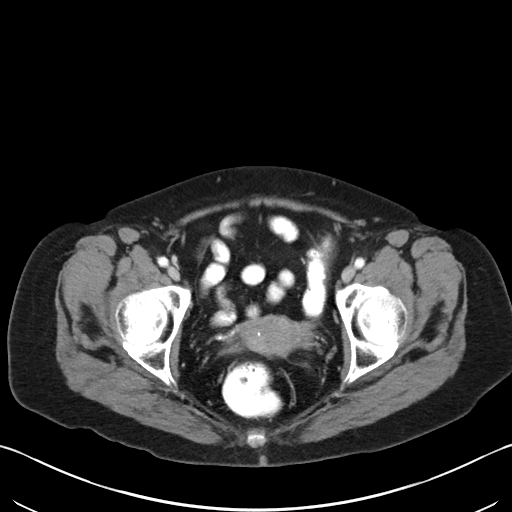
[im 17/79  soft-tissue]
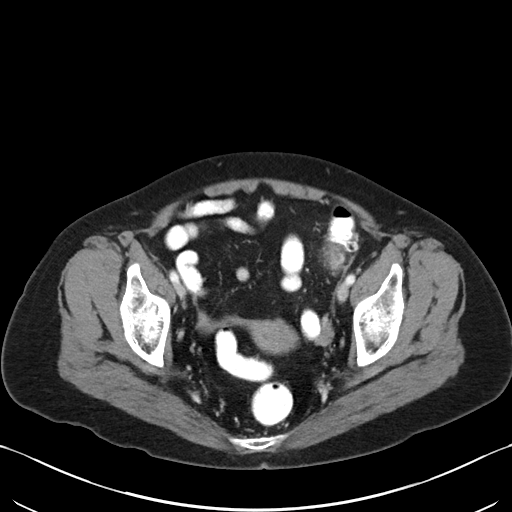
[im 25/79  soft-tissue]
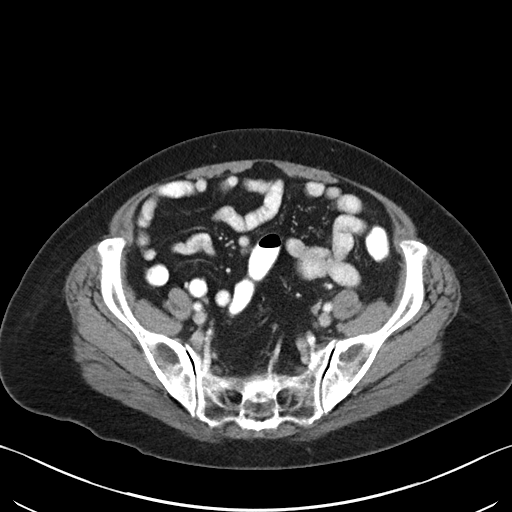
[im 29/79  soft-tissue]
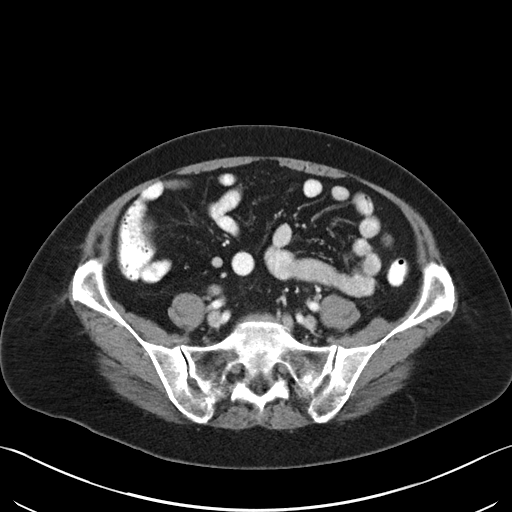
[im 37/79  soft-tissue]
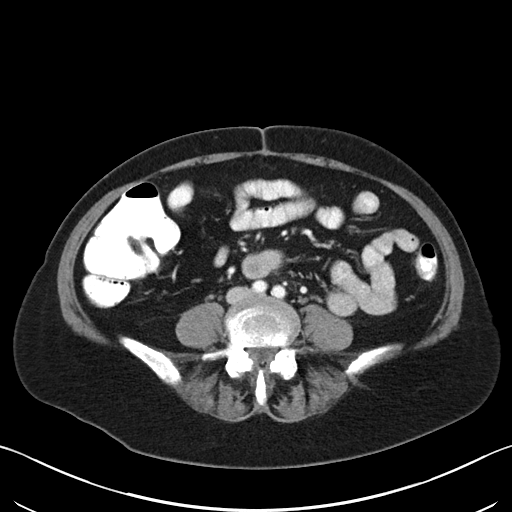
[im 42/79  soft-tissue]
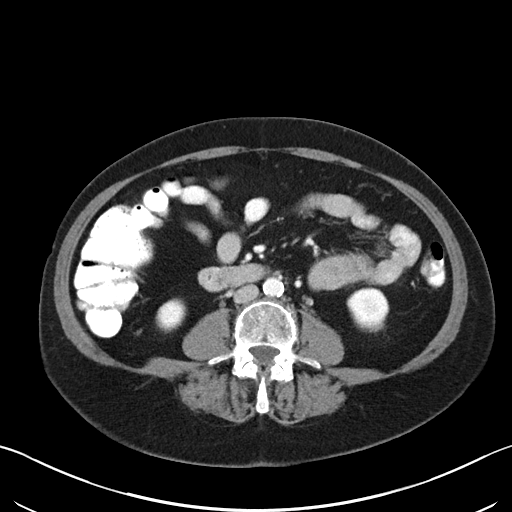
[im 50/79  soft-tissue]
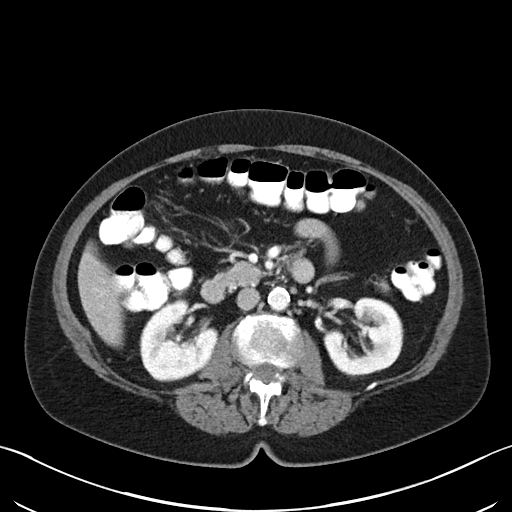
[im 54/79  soft-tissue]
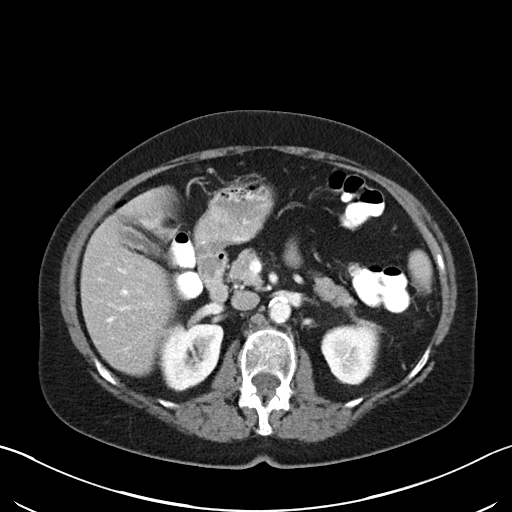
[im 54/79  bone]
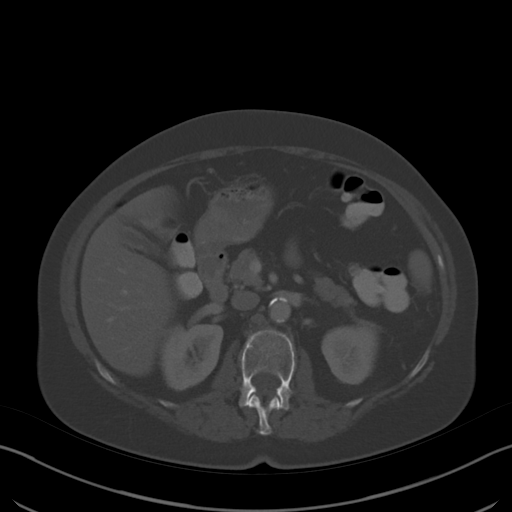
[im 62/79  soft-tissue]
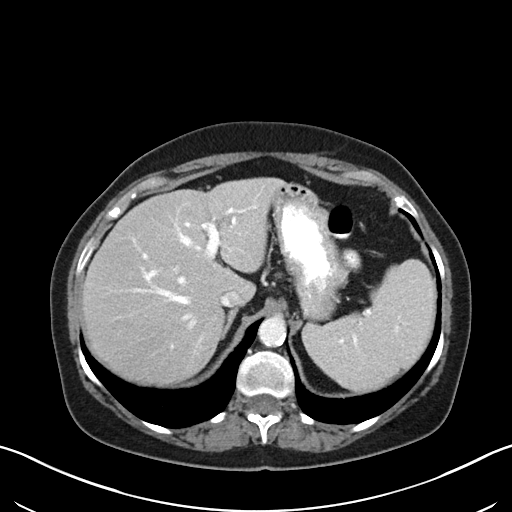
[im 66/79  soft-tissue]
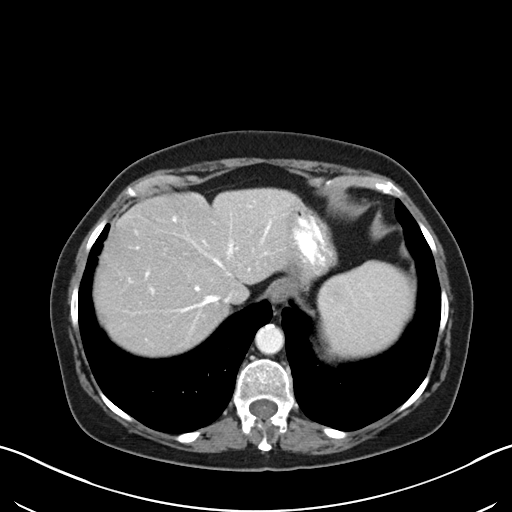
[im 74/79  soft-tissue]
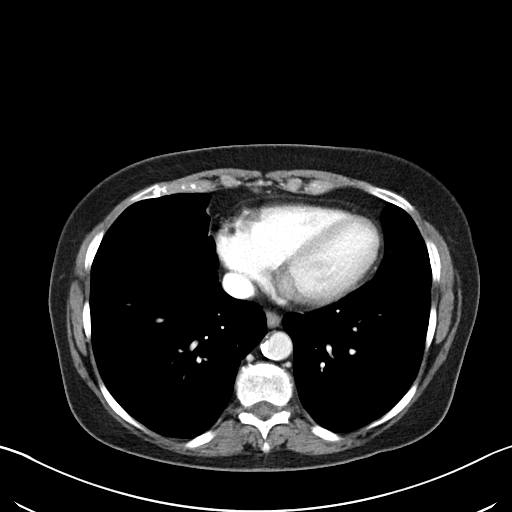

[Series 6: abd/pel w st · coronal · 0.60mm/px · 3 of 79 slices shown]
[im 27/79  soft-tissue]
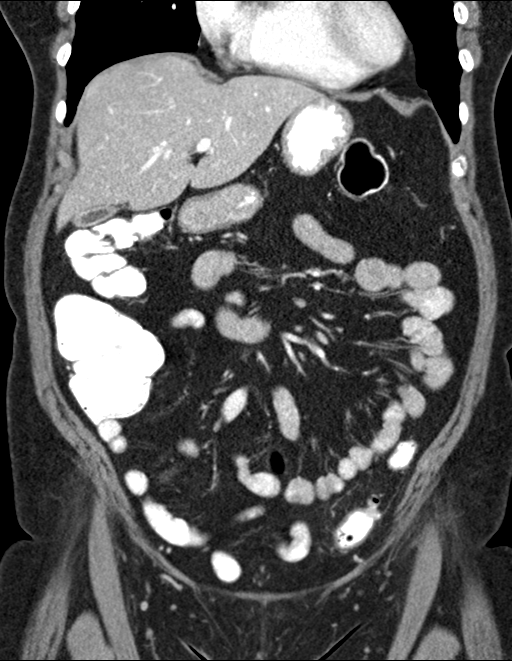
[im 35/79  soft-tissue]
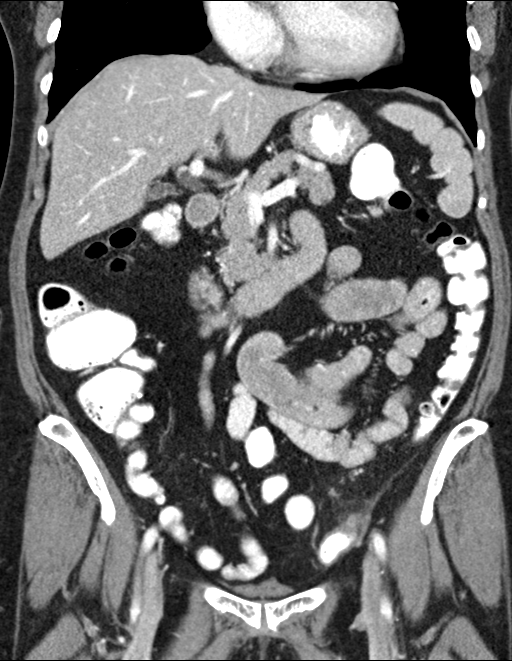
[im 44/79  soft-tissue]
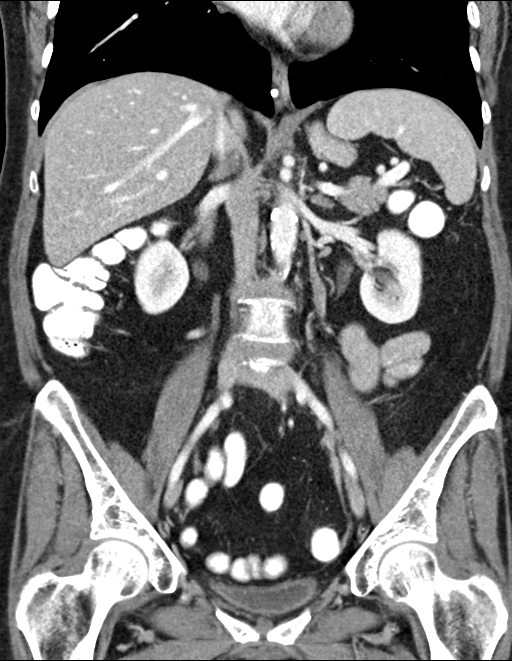

[15 of 46 positions shown; findings below may reference images not displayed]

FINDINGS: Lower chest: The lung bases are clear. The heart is within normal
limits in size. Calcified node is adjacent to the esophagus possibly
due to prior granulomatous disease.

Hepatobiliary: The liver enhances and there is a single
subcentimeter low-attenuation within the left lobe most consistent
with incidental cyst. The gallbladder is contracted and no calcified
gallstones are seen.

Pancreas: The pancreas is normal in size and the pancreatic duct is
not dilated.

Spleen: The spleen is minimally prominent.

Adrenals/Urinary Tract: The adrenal glands appear normal. The
kidneys enhance with no evidence of mass and no calculus is seen. On
delayed images, the pelvocaliceal systems appear normal. The ureters
are normal in caliber. The urinary bladder is decompressed and
cannot be evaluated.

Stomach/Bowel: Stomach is decompressed. No small bowel distention is
seen. There is un complicated diverticulitis involving the proximal
sigmoid colon near the junction with distal descending colon. There
is pericolonic strandiness and some thickening of the lateral
condyle fascia but no abscess is seen. Several diverticula are
present at that site. The terminal ileum is unremarkable. The
appendix has previously been resected by history.

Vascular/Lymphatic: The abdominal aorta is normal in caliber with
moderate abdominal aortic atherosclerosis. No adenopathy is seen.

Reproductive: The uterus is normal in size. There is a small right
ovarian cyst present of 2.1 cm with attenuation of 10 HU. No free
fluid is seen within the pelvis.

Other: None.

Musculoskeletal: The lumbar vertebrae are in normal alignment with
normal intervertebral disc spaces. There is degenerative change
involving the facet joints particularly of L5-S1. There is a
sclerotic focus within the left femoral head most consistent with a
benign bony lesion. This appears well corticated and is of doubtful
clinical significance.
IMPRESSION: 1. Acute uncomplicated diverticulitis of the proximal sigmoid colon
2. Moderate abdominal aortic atherosclerosis.
3. Small right ovarian cyst of 21 mm.

## 2019-10-05 DIAGNOSIS — N1831 Chronic kidney disease, stage 3a: Secondary | ICD-10-CM | POA: Diagnosis not present

## 2019-10-05 DIAGNOSIS — Z1331 Encounter for screening for depression: Secondary | ICD-10-CM | POA: Diagnosis not present

## 2019-10-05 DIAGNOSIS — D126 Benign neoplasm of colon, unspecified: Secondary | ICD-10-CM | POA: Diagnosis not present

## 2019-10-05 DIAGNOSIS — Z0000 Encounter for general adult medical examination without abnormal findings: Secondary | ICD-10-CM | POA: Diagnosis not present

## 2019-10-05 DIAGNOSIS — F329 Major depressive disorder, single episode, unspecified: Secondary | ICD-10-CM | POA: Diagnosis not present

## 2019-10-12 DIAGNOSIS — Z1212 Encounter for screening for malignant neoplasm of rectum: Secondary | ICD-10-CM | POA: Diagnosis not present

## 2019-10-12 DIAGNOSIS — M25552 Pain in left hip: Secondary | ICD-10-CM | POA: Diagnosis not present

## 2019-10-13 ENCOUNTER — Ambulatory Visit: Payer: Medicare Other | Attending: Internal Medicine

## 2019-10-13 DIAGNOSIS — Z23 Encounter for immunization: Secondary | ICD-10-CM | POA: Insufficient documentation

## 2019-10-13 NOTE — Progress Notes (Signed)
   Covid-19 Vaccination Clinic  Name:  Caroline Peters    MRN: [id number] DOB: 1945/10/31  10/13/2019  [name] was observed post Covid-19 immunization for 15 minutes without incidence. She was provided with Vaccine Information Sheet and instruction to access the V-Safe system.   [name] was instructed to call 911 with any severe reactions post vaccine: Marland Kitchen Difficulty breathing  . Swelling of your face and throat  . A fast heartbeat  . A bad rash all over your body  . Dizziness and weakness    Immunizations Administered    Name Date Dose VIS Date Route   Pfizer COVID-19 Vaccine 10/13/2019  2:46 PM 0.3 mL 08/14/2019 Intramuscular   Manufacturer: [place]   Lot: [place] [id number]   [place]: [id number]

## 2019-10-15 DIAGNOSIS — H524 Presbyopia: Secondary | ICD-10-CM | POA: Diagnosis not present

## 2019-11-18 DIAGNOSIS — M7062 Trochanteric bursitis, left hip: Secondary | ICD-10-CM | POA: Diagnosis not present

## 2019-11-18 DIAGNOSIS — S76012A Strain of muscle, fascia and tendon of left hip, initial encounter: Secondary | ICD-10-CM | POA: Diagnosis not present

## 2019-11-18 DIAGNOSIS — M25552 Pain in left hip: Secondary | ICD-10-CM | POA: Diagnosis not present

## 2019-12-21 DIAGNOSIS — K219 Gastro-esophageal reflux disease without esophagitis: Secondary | ICD-10-CM | POA: Diagnosis not present

## 2019-12-21 DIAGNOSIS — R519 Headache, unspecified: Secondary | ICD-10-CM | POA: Diagnosis not present

## 2019-12-21 DIAGNOSIS — I1310 Hypertensive heart and chronic kidney disease without heart failure, with stage 1 through stage 4 chronic kidney disease, or unspecified chronic kidney disease: Secondary | ICD-10-CM | POA: Diagnosis not present

## 2019-12-21 DIAGNOSIS — R259 Unspecified abnormal involuntary movements: Secondary | ICD-10-CM | POA: Diagnosis not present

## 2019-12-22 ENCOUNTER — Other Ambulatory Visit (HOSPITAL_COMMUNITY): Payer: Self-pay | Admitting: Internal Medicine

## 2019-12-22 ENCOUNTER — Other Ambulatory Visit (HOSPITAL_BASED_OUTPATIENT_CLINIC_OR_DEPARTMENT_OTHER): Payer: Self-pay | Admitting: Internal Medicine

## 2019-12-22 ENCOUNTER — Other Ambulatory Visit: Payer: Self-pay | Admitting: Internal Medicine

## 2019-12-22 DIAGNOSIS — R259 Unspecified abnormal involuntary movements: Secondary | ICD-10-CM

## 2019-12-22 DIAGNOSIS — R519 Headache, unspecified: Secondary | ICD-10-CM

## 2019-12-23 ENCOUNTER — Ambulatory Visit (HOSPITAL_COMMUNITY)
Admission: RE | Admit: 2019-12-23 | Discharge: 2019-12-23 | Disposition: A | Discharge status: Home / Self Care | Payer: Medicare Other | Source: Ambulatory Visit | Attending: Internal Medicine | Admitting: Internal Medicine

## 2019-12-23 ENCOUNTER — Other Ambulatory Visit: Payer: Self-pay

## 2019-12-23 DIAGNOSIS — R519 Headache, unspecified: Secondary | ICD-10-CM

## 2019-12-23 DIAGNOSIS — R259 Unspecified abnormal involuntary movements: Secondary | ICD-10-CM | POA: Diagnosis not present

## 2019-12-23 MED ORDER — GADOBUTROL 1 MMOL/ML IV SOLN
6.5000 mL | Freq: Once | INTRAVENOUS | Status: AC | PRN
  Administered 2019-12-23: 6.5 mL via INTRAVENOUS

## 2020-02-03 DIAGNOSIS — Z0120 Encounter for dental examination and cleaning without abnormal findings: Secondary | ICD-10-CM | POA: Diagnosis not present

## 2020-03-14 DIAGNOSIS — Z1231 Encounter for screening mammogram for malignant neoplasm of breast: Secondary | ICD-10-CM | POA: Diagnosis not present

## 2020-05-04 DIAGNOSIS — K5790 Diverticulosis of intestine, part unspecified, without perforation or abscess without bleeding: Secondary | ICD-10-CM | POA: Diagnosis not present

## 2020-05-04 DIAGNOSIS — R197 Diarrhea, unspecified: Secondary | ICD-10-CM | POA: Diagnosis not present

## 2020-05-25 DIAGNOSIS — R5383 Other fatigue: Secondary | ICD-10-CM | POA: Diagnosis not present

## 2020-05-25 DIAGNOSIS — R519 Headache, unspecified: Secondary | ICD-10-CM | POA: Diagnosis not present

## 2020-05-25 DIAGNOSIS — E039 Hypothyroidism, unspecified: Secondary | ICD-10-CM | POA: Diagnosis not present

## 2020-05-25 DIAGNOSIS — Z1152 Encounter for screening for COVID-19: Secondary | ICD-10-CM | POA: Diagnosis not present

## 2020-06-01 DIAGNOSIS — M791 Myalgia, unspecified site: Secondary | ICD-10-CM | POA: Diagnosis not present

## 2020-06-01 DIAGNOSIS — M25519 Pain in unspecified shoulder: Secondary | ICD-10-CM | POA: Diagnosis not present

## 2020-06-01 DIAGNOSIS — M25552 Pain in left hip: Secondary | ICD-10-CM | POA: Diagnosis not present

## 2020-06-28 DIAGNOSIS — N1831 Chronic kidney disease, stage 3a: Secondary | ICD-10-CM | POA: Diagnosis not present

## 2020-10-14 DIAGNOSIS — E785 Hyperlipidemia, unspecified: Secondary | ICD-10-CM | POA: Diagnosis not present

## 2020-10-14 DIAGNOSIS — E039 Hypothyroidism, unspecified: Secondary | ICD-10-CM | POA: Diagnosis not present

## 2020-10-18 DIAGNOSIS — Z1212 Encounter for screening for malignant neoplasm of rectum: Secondary | ICD-10-CM | POA: Diagnosis not present

## 2020-10-18 DIAGNOSIS — R82998 Other abnormal findings in urine: Secondary | ICD-10-CM | POA: Diagnosis not present

## 2020-10-20 DIAGNOSIS — H524 Presbyopia: Secondary | ICD-10-CM | POA: Diagnosis not present

## 2020-10-21 ENCOUNTER — Other Ambulatory Visit: Payer: Self-pay | Admitting: Internal Medicine

## 2020-10-21 DIAGNOSIS — E785 Hyperlipidemia, unspecified: Secondary | ICD-10-CM | POA: Diagnosis not present

## 2020-10-21 DIAGNOSIS — Z0000 Encounter for general adult medical examination without abnormal findings: Secondary | ICD-10-CM | POA: Diagnosis not present

## 2020-10-21 DIAGNOSIS — L659 Nonscarring hair loss, unspecified: Secondary | ICD-10-CM | POA: Diagnosis not present

## 2020-10-21 DIAGNOSIS — E039 Hypothyroidism, unspecified: Secondary | ICD-10-CM | POA: Diagnosis not present

## 2020-10-21 DIAGNOSIS — I10 Essential (primary) hypertension: Secondary | ICD-10-CM | POA: Diagnosis not present

## 2020-10-21 DIAGNOSIS — F329 Major depressive disorder, single episode, unspecified: Secondary | ICD-10-CM | POA: Diagnosis not present

## 2020-10-28 DIAGNOSIS — Z13820 Encounter for screening for osteoporosis: Secondary | ICD-10-CM | POA: Diagnosis not present

## 2020-11-08 ENCOUNTER — Ambulatory Visit
Admission: RE | Admit: 2020-11-08 | Discharge: 2020-11-08 | Disposition: A | Discharge status: Home / Self Care | Payer: Medicare Other | Source: Ambulatory Visit | Attending: Internal Medicine | Admitting: Internal Medicine

## 2020-11-08 DIAGNOSIS — E785 Hyperlipidemia, unspecified: Secondary | ICD-10-CM

## 2020-11-09 DIAGNOSIS — D225 Melanocytic nevi of trunk: Secondary | ICD-10-CM | POA: Diagnosis not present

## 2020-11-09 DIAGNOSIS — L814 Other melanin hyperpigmentation: Secondary | ICD-10-CM | POA: Diagnosis not present

## 2020-11-09 DIAGNOSIS — D2271 Melanocytic nevi of right lower limb, including hip: Secondary | ICD-10-CM | POA: Diagnosis not present

## 2020-11-09 DIAGNOSIS — Z85820 Personal history of malignant melanoma of skin: Secondary | ICD-10-CM | POA: Diagnosis not present

## 2020-11-10 DIAGNOSIS — E785 Hyperlipidemia, unspecified: Secondary | ICD-10-CM | POA: Diagnosis not present

## 2020-11-10 DIAGNOSIS — I2510 Atherosclerotic heart disease of native coronary artery without angina pectoris: Secondary | ICD-10-CM | POA: Diagnosis not present

## 2020-11-10 DIAGNOSIS — I1310 Hypertensive heart and chronic kidney disease without heart failure, with stage 1 through stage 4 chronic kidney disease, or unspecified chronic kidney disease: Secondary | ICD-10-CM | POA: Diagnosis not present

## 2020-11-10 DIAGNOSIS — N1831 Chronic kidney disease, stage 3a: Secondary | ICD-10-CM | POA: Diagnosis not present

## 2020-11-15 ENCOUNTER — Ambulatory Visit: Payer: Medicare Other | Admitting: Cardiovascular Disease

## 2020-11-15 ENCOUNTER — Other Ambulatory Visit: Payer: Self-pay

## 2020-11-15 ENCOUNTER — Encounter: Payer: Self-pay | Admitting: Cardiovascular Disease

## 2020-11-15 VITALS — BP 114/60 | HR 80 | Ht 62.0 in | Wt 138.0 lb

## 2020-11-15 DIAGNOSIS — E782 Mixed hyperlipidemia: Secondary | ICD-10-CM | POA: Diagnosis not present

## 2020-11-15 DIAGNOSIS — R931 Abnormal findings on diagnostic imaging of heart and coronary circulation: Secondary | ICD-10-CM | POA: Diagnosis not present

## 2020-11-15 DIAGNOSIS — R079 Chest pain, unspecified: Secondary | ICD-10-CM | POA: Diagnosis not present

## 2020-11-15 NOTE — Progress Notes (Signed)
Cardiology Office Note:    Date:  11/16/2020   ID:  Caroline Peters, DOB 12-Feb-1946, MRN [id number]  PCP:  Crist Infante, [place]  Cardiologist:  Sherren Mocha, MD  Advanced Practice Provider:  No care team member to display Electrophysiologist:  None       Referring MD: Crist Infante, MD   Chief Complaint  Patient presents with  . Coronary Artery Disease    History of Present Illness:    Caroline Peters is a 75 y.o. female referred by Dr Joylene Draft for evaluation of elevated coronary calcium score.   The patient is here alone today. She states that she feels 'foggy' all of the time. Not sure if this is 'age-related' or whether is might indicate a problems. Also states she is fatigued and that her 'get up and go' is gone. She naps on a daily basis. She has a lot of indigestion with chest discomfort, especially at night, but really doesn't have any exertional chest pain or pressure. No shortness of breath, orthopnea, or PND. No palpitations. She has occasional lightheadedness.   Her mother and father both died of heart attacks. Her mother's first MI was in her 56's. Her father had 3 vessel CABG at age 50. The patient is a lifelong nonsmoker. She has no hx of HTN or diabetes.   Past Medical History:  Diagnosis Date  . Anxiety   . Cancer ([place])    Melanoma- on arm age 76   . DEPRESSION 04/16/2007  . Fibroadenoma of breast   . HA (headache)    hx of   . Hyperlipidemia   . Hypertension   . Hypothyroid   . HYPOTHYROIDISM 04/16/2007  . Kidney stone   . Osteopenia 02/2018   T score -1.9 FRAX 10% / 1.4% stable from prior DEXA.    Past Surgical History:  Procedure Laterality Date  . BREAST SURGERY     bx left breast- and removal of fibroadenoma  . COLONOSCOPY    . EXCISION OF MELANOMA  1978  . HYSTEROSCOPY  1988   HYSTEROSCOPY,D&C  . LAPAROSCOPIC APPENDECTOMY N/A 11/10/2015   Procedure: APPENDECTOMY LAPAROSCOPIC W PARTIAL CECECTOMY ;  Surgeon: Jackolyn Confer, MD;  Location: [facility];  Service: General;  Laterality: N/A;  . LAPAROSCOPIC ILEOCECECTOMY N/A 11/10/2015   Procedure: LAPAROSCOPIC ILEOCECECTOMY;  Surgeon: Jackolyn Confer, MD;  Location: [facility];  Service: General;  Laterality: N/A;  . MASS EXCISION Right 02/23/2014   Procedure: BIOPSY TEMPORAL ARTERY RIGHT;  Surgeon: Rozetta Nunnery, MD;  Location: [facility];  Service: ENT;  Laterality: Right;  . POLYPECTOMY    . TONSILLECTOMY      Current Medications: Current Meds  Medication Sig  . ALPRAZolam (XANAX) 0.5 MG tablet TAKE 1/2 TABLET TWICE DAILY AS NEEDED.  Marland Kitchen buPROPion (WELLBUTRIN XL) 150 MG 24 hr tablet Take 150 mg by mouth every morning.  . calcium carbonate (OSCAL) 1500 (600 Ca) MG TABS tablet Take 1 tablet by mouth daily.  . Cholecalciferol (VITAMIN D3) 10 MCG (400 UNIT) CAPS Take by mouth daily.  Marland Kitchen ibandronate (BONIVA) 150 MG tablet daily.  Marland Kitchen levothyroxine (SYNTHROID, LEVOTHROID) 88 MCG tablet TAKE 1 TABLET ONCE DAILY.  Marland Kitchen lisinopril (PRINIVIL,ZESTRIL) 40 MG tablet TAKE 1 TABLET EACH DAY.  . rosuvastatin (CRESTOR) 20 MG tablet Take 1 tablet (20 mg total) by mouth daily.  . sertraline (ZOLOFT) 100 MG tablet Take 1.5 tablets (150 mg total) by mouth daily.  . vancomycin (  VANCOCIN) 125 MG capsule Take 125 mg by mouth 4 (four) times daily.  Marland Kitchen zolpidem (AMBIEN) 5 MG tablet TAKE 1/2 TO 1 TABLET AT BEDTIME AS NEEDED.  . [DISCONTINUED] Biotin 1000 MCG tablet Take 1,000 mcg by mouth daily.  . [DISCONTINUED] levothyroxine (SYNTHROID) 100 MCG tablet Take 100 mcg by mouth daily.     Allergies:   Compazine and Prednisone   Social History   Socioeconomic History  . Marital status: Married    Spouse name: Caroline Peters  . Number of children: Not on file  . Years of education: college  . Highest education level: Not on file  Occupational History  . Not on file  Tobacco Use  . Smoking status: Never Smoker  . Smokeless tobacco: Never Used  Vaping Use  . Vaping Use:  Never used  Substance and Sexual Activity  . Alcohol use: Yes    Alcohol/week: 14.0 standard drinks    Types: 14 Standard drinks or equivalent per week    Comment: wine daily 14 glasses per week  . Drug use: No  . Sexual activity: Not Currently    Birth control/protection: Post-menopausal    Comment: 1st intercourse 28 yo-1 partner  Other Topics Concern  . Not on file  Social History Narrative   Patient lives at home with her husband Caroline Peters). 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      Education : 2 years [employer], 2 years [employer]      Hobbies: grandchildren time, garden, reading, [employer]      Right handed. Caffeine one cup daily   Social Determinants of Health   Financial Resource Strain: Not on file  Food Insecurity: Not on file  Transportation Needs: Not on file  Physical Activity: Not on file  Stress: Not on file  Social Connections: Not on file     Family History: The patient's family history includes Breast cancer in her maternal grandmother; Cancer in her father; Colon cancer in an other family member; Dementia in her mother; Diabetes in her father and mother; Heart disease (age of onset: 98) in her father; Heart disease (age of onset: 46) in her mother. There is no history of Stomach cancer, Colon polyps, Esophageal cancer, or Rectal cancer.  ROS:   Please see the history of present illness.    All other systems reviewed and are negative.  EKGs/Labs/Other Studies Reviewed:    The following studies were reviewed today: Cardiac CT Coronary Ca Score: CORONARY CALCIUM SCORES:  Left Main: 85  LAD: 472  LCx: No coronary calcification  RCA: 510  CORONARY CALCIUM  Total Agatston Score: 1,067  MESA database percentile: 95  Ascending aorta (normal <  40 mm): 35 mm  EXTRACARDIAC FINDINGS:  Limited view of the lung parenchyma  demonstrates small nodule in the LEFT lower lobemeasuring 4 mm (59/9) which is unchanged from CT 2019. Airways are normal.  Limited view of the mediastinum demonstrates no adenopathy. Esophagus normal.  Limited view of the upper abdomen is unremarkable.  Limited view of the skeleton and chest wall is unremarkable.  IMPRESSION: 1. LEFT main, LAD and RIGHT coronary artery calcification.  2. Total Agatston Score: 1,067  3. MESA age and sex matched database percentile: 95  EKG:  EKG is ordered today.  The ekg ordered today demonstrates normal sinus rhythm 80 bpm, within normal limits.  Recent Labs:  No results found for requested labs within last 8760 hours.  Recent Lipid Panel    Component Value Date/Time   CHOL 203 (H) 10/09/2017 0934   TRIG 71.0 10/09/2017 0934   TRIG 119 06/17/2006 0841   HDL 78.60 10/09/2017 0934   CHOLHDL 3 10/09/2017 0934   VLDL 14.2 10/09/2017 0934   LDLCALC 110 (H) 10/09/2017 0934   LDLDIRECT 82.0 01/06/2018 0954     Risk Assessment/Calculations:       Physical Exam:    VS:  BP 114/60   Pulse 80   Ht 5\' 2"  (1.575 m)   Wt 138 lb (62.6 kg)   SpO2 96%   BMI 25.24 kg/m     Wt Readings from Last 3 Encounters:  11/15/20 138 lb (62.6 kg)  10/15/18 132 lb 12.8 oz (60.2 kg)  08/18/18 135 lb (61.2 kg)     GEN:  Well nourished, well developed in no acute distress HEENT: Normal NECK: No JVD; No carotid bruits LYMPHATICS: No lymphadenopathy CARDIAC: RRR, 1/6 systolic ejection murmur at the right upper sternal border RESPIRATORY:  Clear to auscultation without rales, wheezing or rhonchi  ABDOMEN: Soft, non-tender, non-distended MUSCULOSKELETAL:  No edema; No deformity  SKIN: Warm and dry NEUROLOGIC:  Alert and oriented x 3 PSYCHIATRIC:  Normal affect   ASSESSMENT:    1. Elevated coronary artery calcium score   2. Mixed hyperlipidemia   3. Chest pain, unspecified type    PLAN:    In order of problems listed above:  1. The  patient has vague symptoms which certainly could be consistent with atypical symptoms of ischemic heart disease.  Her CT is reviewed and demonstrates heavy coronary calcification of the left main, LAD, and right coronary arteries.  I have recommended a Lexiscan Myoview stress test for further evaluation.  The degree of coronary calcification will significantly limit a gated coronary CTA so I do not think this would be a good choice of noninvasive imaging.  The patient is on appropriate medical therapy with a high intensity statin drug under the care of Dr. Haynes Kerns. 2. Treated with rosuvastatin 20 mg daily.  Recent lipids show a cholesterol of 203, HDL 94, LDL 93, triglycerides 80.  Hemoglobin A1c is 5.3.  ALT is normal at 24.  Continue current therapy.  We will be in touch with the patient after the results of her nuclear scan are available.  As long as she does not have significant ischemia, I would anticipate seeing her back in 1 year for follow-up evaluation.   Shared Decision Making/Informed Consent The risks [chest pain, shortness of breath, cardiac arrhythmias, dizziness, blood pressure fluctuations, myocardial infarction, stroke/transient ischemic attack, nausea, vomiting, allergic reaction, radiation exposure, metallic taste sensation and life-threatening complications (estimated to be 1 in 10,000)], benefits (risk stratification, diagnosing coronary artery disease, treatment guidance) and alternatives of a nuclear stress test were discussed in detail with Ms. Jacko and she agrees to proceed.  Medication Adjustments/Labs and Tests Ordered: Current medicines are reviewed at length with the patient today.  Concerns regarding medicines are outlined above.  Orders Placed This Encounter  Procedures  . MYOCARDIAL PERFUSION IMAGING  . EKG 12-Lead   No orders of the defined types were placed in this encounter.   Patient Instructions  Medication Instructions:  Your provider recommends that you  continue on your current medications as directed. Please refer to the Current Medication list given to you today.   *If you need a refill on your cardiac medications before your  next appointment, please call your pharmacy*  Testing/Procedures: Your provider has requested that you have a lexiscan myoview. For further information please visit [email or web address]. Please follow instruction sheet, as given.  Follow-Up: At [facility], you and your health needs are our priority.  As part of our continuing mission to provide you with exceptional heart care, we have created designated Provider Care Teams.  These Care Teams include your primary Cardiologist (physician) and Advanced Practice Providers (APPs -  Physician Assistants and Nurse Practitioners) who all work together to provide you with the care you need, when you need it. Your next appointment:   12 month(s) The format for your next appointment:   In Person Provider:   You may see Dr. Burt Knack or one of the following Advanced Practice Providers on your designated Care Team:    Richardson Dopp, PA-C  Robbie Lis, [place]      Signed, Sherren Mocha, MD  11/16/2020 5:43 AM    [place]

## 2020-11-15 NOTE — Patient Instructions (Signed)
Medication Instructions:  Your provider recommends that you continue on your current medications as directed. Please refer to the Current Medication list given to you today.   *If you need a refill on your cardiac medications before your next appointment, please call your pharmacy*  Testing/Procedures: Your provider has requested that you have a lexiscan myoview. For further information please visit [email or web address]. Please follow instruction sheet, as given.  Follow-Up: At [facility], you and your health needs are our priority.  As part of our continuing mission to provide you with exceptional heart care, we have created designated Provider Care Teams.  These Care Teams include your primary Cardiologist (physician) and Advanced Practice Providers (APPs -  Physician Assistants and Nurse Practitioners) who all work together to provide you with the care you need, when you need it. Your next appointment:   12 month(s) The format for your next appointment:   In Person Provider:   You may see Dr. Burt Knack or one of the following Advanced Practice Providers on your designated Care Team:    Richardson Dopp, PA-C  [place], [place]

## 2020-11-17 ENCOUNTER — Ambulatory Visit: Payer: No Typology Code available for payment source | Admitting: Cardiology

## 2020-11-21 ENCOUNTER — Telehealth (HOSPITAL_COMMUNITY): Payer: Self-pay

## 2020-11-21 NOTE — Telephone Encounter (Signed)
Detailed instructions left on the patient's answering machine. Asked to call back with any questions. S.Williams EMTP 

## 2020-11-22 ENCOUNTER — Other Ambulatory Visit: Payer: Self-pay

## 2020-11-22 ENCOUNTER — Ambulatory Visit (HOSPITAL_COMMUNITY): Payer: Medicare Other | Attending: Cardiology

## 2020-11-22 DIAGNOSIS — R079 Chest pain, unspecified: Secondary | ICD-10-CM

## 2020-11-22 DIAGNOSIS — R931 Abnormal findings on diagnostic imaging of heart and coronary circulation: Secondary | ICD-10-CM | POA: Diagnosis not present

## 2020-11-22 LAB — MYOCARDIAL PERFUSION IMAGING
LV dias vol: 31 mL (ref 46–106)
LV sys vol: 4 mL
Peak HR: 115 {beats}/min
Rest HR: 61 {beats}/min
SDS: 5
SRS: 0
SSS: 5
TID: 0.71

## 2020-11-22 MED ORDER — REGADENOSON 0.4 MG/5ML IV SOLN
0.4000 mg | Freq: Once | INTRAVENOUS | Status: AC
  Administered 2020-11-22: 0.4 mg via INTRAVENOUS

## 2020-11-22 MED ORDER — TECHNETIUM TC 99M TETROFOSMIN IV KIT
10.8000 | PACK | Freq: Once | INTRAVENOUS | Status: AC | PRN
  Administered 2020-11-22: 10.8 via INTRAVENOUS
  Filled 2020-11-22: qty 11

## 2020-11-22 MED ORDER — TECHNETIUM TC 99M TETROFOSMIN IV KIT
30.5000 | PACK | Freq: Once | INTRAVENOUS | Status: AC | PRN
  Administered 2020-11-22: 30.5 via INTRAVENOUS
  Filled 2020-11-22: qty 31

## 2020-11-22 MED ORDER — AMINOPHYLLINE 25 MG/ML IV SOLN
150.0000 mg | Freq: Once | INTRAVENOUS | Status: AC
  Administered 2020-11-22: 75 mg via INTRAVENOUS

## 2021-01-24 DIAGNOSIS — I1310 Hypertensive heart and chronic kidney disease without heart failure, with stage 1 through stage 4 chronic kidney disease, or unspecified chronic kidney disease: Secondary | ICD-10-CM | POA: Diagnosis not present

## 2021-01-24 DIAGNOSIS — U071 COVID-19: Secondary | ICD-10-CM | POA: Diagnosis not present

## 2021-01-24 DIAGNOSIS — E785 Hyperlipidemia, unspecified: Secondary | ICD-10-CM | POA: Diagnosis not present

## 2021-01-24 DIAGNOSIS — E039 Hypothyroidism, unspecified: Secondary | ICD-10-CM | POA: Diagnosis not present

## 2021-02-01 DIAGNOSIS — U071 COVID-19: Secondary | ICD-10-CM | POA: Diagnosis not present

## 2021-02-01 DIAGNOSIS — N1831 Chronic kidney disease, stage 3a: Secondary | ICD-10-CM | POA: Diagnosis not present

## 2021-02-01 DIAGNOSIS — R059 Cough, unspecified: Secondary | ICD-10-CM | POA: Diagnosis not present

## 2021-02-01 DIAGNOSIS — I1310 Hypertensive heart and chronic kidney disease without heart failure, with stage 1 through stage 4 chronic kidney disease, or unspecified chronic kidney disease: Secondary | ICD-10-CM | POA: Diagnosis not present

## 2021-02-23 DIAGNOSIS — I10 Essential (primary) hypertension: Secondary | ICD-10-CM | POA: Diagnosis not present

## 2021-02-24 DIAGNOSIS — L659 Nonscarring hair loss, unspecified: Secondary | ICD-10-CM | POA: Diagnosis not present

## 2021-03-21 DIAGNOSIS — Z1231 Encounter for screening mammogram for malignant neoplasm of breast: Secondary | ICD-10-CM | POA: Diagnosis not present

## 2021-03-22 DIAGNOSIS — R051 Acute cough: Secondary | ICD-10-CM | POA: Diagnosis not present

## 2021-03-22 DIAGNOSIS — R5383 Other fatigue: Secondary | ICD-10-CM | POA: Diagnosis not present

## 2021-03-22 DIAGNOSIS — J069 Acute upper respiratory infection, unspecified: Secondary | ICD-10-CM | POA: Diagnosis not present

## 2021-03-22 DIAGNOSIS — J029 Acute pharyngitis, unspecified: Secondary | ICD-10-CM | POA: Diagnosis not present

## 2021-07-10 DIAGNOSIS — M25511 Pain in right shoulder: Secondary | ICD-10-CM | POA: Diagnosis not present

## 2021-09-06 DIAGNOSIS — M25511 Pain in right shoulder: Secondary | ICD-10-CM | POA: Diagnosis not present

## 2021-09-06 DIAGNOSIS — M545 Low back pain, unspecified: Secondary | ICD-10-CM | POA: Diagnosis not present

## 2021-09-08 DIAGNOSIS — M545 Low back pain, unspecified: Secondary | ICD-10-CM | POA: Diagnosis not present

## 2021-09-15 DIAGNOSIS — M545 Low back pain, unspecified: Secondary | ICD-10-CM | POA: Diagnosis not present

## 2021-09-20 DIAGNOSIS — M6281 Muscle weakness (generalized): Secondary | ICD-10-CM | POA: Diagnosis not present

## 2021-09-20 DIAGNOSIS — S46011D Strain of muscle(s) and tendon(s) of the rotator cuff of right shoulder, subsequent encounter: Secondary | ICD-10-CM | POA: Diagnosis not present

## 2021-09-20 DIAGNOSIS — M7541 Impingement syndrome of right shoulder: Secondary | ICD-10-CM | POA: Diagnosis not present

## 2021-10-09 DIAGNOSIS — H2513 Age-related nuclear cataract, bilateral: Secondary | ICD-10-CM | POA: Diagnosis not present

## 2021-10-09 DIAGNOSIS — H1131 Conjunctival hemorrhage, right eye: Secondary | ICD-10-CM | POA: Diagnosis not present

## 2021-10-09 DIAGNOSIS — H10411 Chronic giant papillary conjunctivitis, right eye: Secondary | ICD-10-CM | POA: Diagnosis not present

## 2021-10-20 DIAGNOSIS — M7541 Impingement syndrome of right shoulder: Secondary | ICD-10-CM | POA: Diagnosis not present

## 2021-11-11 DIAGNOSIS — H60399 Other infective otitis externa, unspecified ear: Secondary | ICD-10-CM | POA: Diagnosis not present

## 2021-11-15 DIAGNOSIS — R051 Acute cough: Secondary | ICD-10-CM | POA: Diagnosis not present

## 2021-11-15 DIAGNOSIS — H6501 Acute serous otitis media, right ear: Secondary | ICD-10-CM | POA: Diagnosis not present

## 2021-11-15 DIAGNOSIS — J0100 Acute maxillary sinusitis, unspecified: Secondary | ICD-10-CM | POA: Diagnosis not present

## 2021-11-15 DIAGNOSIS — Z1152 Encounter for screening for COVID-19: Secondary | ICD-10-CM | POA: Diagnosis not present

## 2021-11-17 DIAGNOSIS — M7541 Impingement syndrome of right shoulder: Secondary | ICD-10-CM | POA: Diagnosis not present

## 2021-11-17 DIAGNOSIS — M542 Cervicalgia: Secondary | ICD-10-CM | POA: Diagnosis not present

## 2021-11-24 DIAGNOSIS — M7541 Impingement syndrome of right shoulder: Secondary | ICD-10-CM | POA: Diagnosis not present

## 2021-11-24 DIAGNOSIS — M6281 Muscle weakness (generalized): Secondary | ICD-10-CM | POA: Diagnosis not present

## 2021-11-24 DIAGNOSIS — M542 Cervicalgia: Secondary | ICD-10-CM | POA: Diagnosis not present

## 2021-11-24 DIAGNOSIS — M25611 Stiffness of right shoulder, not elsewhere classified: Secondary | ICD-10-CM | POA: Diagnosis not present

## 2021-11-27 DIAGNOSIS — I10 Essential (primary) hypertension: Secondary | ICD-10-CM | POA: Diagnosis not present

## 2021-11-27 DIAGNOSIS — E039 Hypothyroidism, unspecified: Secondary | ICD-10-CM | POA: Diagnosis not present

## 2021-11-27 DIAGNOSIS — R413 Other amnesia: Secondary | ICD-10-CM | POA: Diagnosis not present

## 2021-11-27 DIAGNOSIS — E785 Hyperlipidemia, unspecified: Secondary | ICD-10-CM | POA: Diagnosis not present

## 2021-11-28 DIAGNOSIS — H9191 Unspecified hearing loss, right ear: Secondary | ICD-10-CM | POA: Diagnosis not present

## 2021-11-28 DIAGNOSIS — H9201 Otalgia, right ear: Secondary | ICD-10-CM | POA: Diagnosis not present

## 2021-12-04 DIAGNOSIS — F329 Major depressive disorder, single episode, unspecified: Secondary | ICD-10-CM | POA: Diagnosis not present

## 2021-12-04 DIAGNOSIS — R82998 Other abnormal findings in urine: Secondary | ICD-10-CM | POA: Diagnosis not present

## 2021-12-04 DIAGNOSIS — Z0000 Encounter for general adult medical examination without abnormal findings: Secondary | ICD-10-CM | POA: Diagnosis not present

## 2021-12-04 DIAGNOSIS — E039 Hypothyroidism, unspecified: Secondary | ICD-10-CM | POA: Diagnosis not present

## 2021-12-04 DIAGNOSIS — I1310 Hypertensive heart and chronic kidney disease without heart failure, with stage 1 through stage 4 chronic kidney disease, or unspecified chronic kidney disease: Secondary | ICD-10-CM | POA: Diagnosis not present

## 2021-12-06 DIAGNOSIS — L814 Other melanin hyperpigmentation: Secondary | ICD-10-CM | POA: Diagnosis not present

## 2021-12-06 DIAGNOSIS — L821 Other seborrheic keratosis: Secondary | ICD-10-CM | POA: Diagnosis not present

## 2021-12-06 DIAGNOSIS — Z85820 Personal history of malignant melanoma of skin: Secondary | ICD-10-CM | POA: Diagnosis not present

## 2021-12-06 DIAGNOSIS — D225 Melanocytic nevi of trunk: Secondary | ICD-10-CM | POA: Diagnosis not present

## 2021-12-18 DIAGNOSIS — H938X1 Other specified disorders of right ear: Secondary | ICD-10-CM | POA: Diagnosis not present

## 2021-12-19 DIAGNOSIS — R413 Other amnesia: Secondary | ICD-10-CM | POA: Diagnosis not present

## 2022-01-01 DIAGNOSIS — M542 Cervicalgia: Secondary | ICD-10-CM | POA: Diagnosis not present

## 2022-01-01 DIAGNOSIS — M7541 Impingement syndrome of right shoulder: Secondary | ICD-10-CM | POA: Diagnosis not present

## 2022-01-23 DIAGNOSIS — L814 Other melanin hyperpigmentation: Secondary | ICD-10-CM | POA: Diagnosis not present

## 2022-01-23 DIAGNOSIS — Z85820 Personal history of malignant melanoma of skin: Secondary | ICD-10-CM | POA: Diagnosis not present

## 2022-01-23 DIAGNOSIS — L821 Other seborrheic keratosis: Secondary | ICD-10-CM | POA: Diagnosis not present

## 2022-02-26 ENCOUNTER — Encounter: Payer: Self-pay | Admitting: Internal Medicine

## 2022-03-12 DIAGNOSIS — N390 Urinary tract infection, site not specified: Secondary | ICD-10-CM | POA: Diagnosis not present

## 2022-03-20 ENCOUNTER — Telehealth: Payer: Self-pay | Admitting: Internal Medicine

## 2022-03-20 NOTE — Telephone Encounter (Signed)
Hi Dr. Hilarie Fredrickson,  Patient called requesting a transfer of care over to Dr. Lorenso Courier. Said she would prefer a female provider.   Please advise on scheduling.  Thanks

## 2022-03-21 NOTE — Telephone Encounter (Signed)
[place] with me if ok with Dr. Dickey Gave

## 2022-03-21 NOTE — Telephone Encounter (Signed)
Hi Dr.  Lorenso Courier,  Would you accept this transfer of care?  Thanks

## 2022-03-27 DIAGNOSIS — Z1231 Encounter for screening mammogram for malignant neoplasm of breast: Secondary | ICD-10-CM | POA: Diagnosis not present

## 2022-03-29 NOTE — Telephone Encounter (Signed)
Called patient to advise her of the transfer approval also to ask about the appt she has scheduled in the office with Dr. Hilarie Fredrickson. Left voicemail.

## 2022-04-05 DIAGNOSIS — R922 Inconclusive mammogram: Secondary | ICD-10-CM | POA: Diagnosis not present

## 2022-04-05 DIAGNOSIS — R928 Other abnormal and inconclusive findings on diagnostic imaging of breast: Secondary | ICD-10-CM | POA: Diagnosis not present

## 2022-05-15 ENCOUNTER — Ambulatory Visit: Payer: Medicare Other | Admitting: Internal Medicine

## 2022-06-06 DIAGNOSIS — H04123 Dry eye syndrome of bilateral lacrimal glands: Secondary | ICD-10-CM | POA: Diagnosis not present

## 2022-06-06 DIAGNOSIS — H10411 Chronic giant papillary conjunctivitis, right eye: Secondary | ICD-10-CM | POA: Diagnosis not present

## 2022-06-20 DIAGNOSIS — E039 Hypothyroidism, unspecified: Secondary | ICD-10-CM | POA: Diagnosis not present

## 2022-06-20 DIAGNOSIS — K760 Fatty (change of) liver, not elsewhere classified: Secondary | ICD-10-CM | POA: Diagnosis not present

## 2022-06-20 DIAGNOSIS — R413 Other amnesia: Secondary | ICD-10-CM | POA: Diagnosis not present

## 2022-06-20 DIAGNOSIS — I1310 Hypertensive heart and chronic kidney disease without heart failure, with stage 1 through stage 4 chronic kidney disease, or unspecified chronic kidney disease: Secondary | ICD-10-CM | POA: Diagnosis not present

## 2022-08-01 ENCOUNTER — Ambulatory Visit: Payer: Medicare Other | Admitting: Internal Medicine

## 2022-08-21 ENCOUNTER — Ambulatory Visit (INDEPENDENT_AMBULATORY_CARE_PROVIDER_SITE_OTHER): Payer: Medicare Other | Admitting: Internal Medicine

## 2022-08-21 ENCOUNTER — Encounter: Payer: Self-pay | Admitting: Internal Medicine

## 2022-08-21 VITALS — BP 116/62 | HR 67 | Ht 62.0 in | Wt 138.4 lb

## 2022-08-21 DIAGNOSIS — Z86010 Personal history of colonic polyps: Secondary | ICD-10-CM

## 2022-08-21 MED ORDER — NA SULFATE-K SULFATE-MG SULF 17.5-3.13-1.6 GM/177ML PO SOLN: 1.0000 | ORAL | 0 refills | Status: AC

## 2022-08-21 NOTE — Progress Notes (Signed)
Chief Complaint: Discuss colonoscopy  HPI : 76 year old female with history of melanoma s/p resection, hypothyroidism, and HTN presents to discuss a colonoscopy procedure.  Denies blood in the stools, changes in bowel habits, constipation, diarrhea, or unintentional weight loss. Cousin has colon cancer. Last colonoscopy was in 2018 that showed diverticulosis and hemorrhoids. 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 She actively works as [employer]. She lives with her husband at home and performs all her own activities of daily living.  Wt Readings from Last 3 Encounters:  08/21/22 138 lb 6 oz (62.8 kg)  11/22/20 138 lb (62.6 kg)  11/15/20 138 lb (62.6 kg)   Past Medical History:  Diagnosis Date   Anxiety    Cancer ([place])    Melanoma- on arm age 37    DEPRESSION 04/16/2007   Fibroadenoma of breast    HA (headache)    hx of    Hyperlipidemia    Hypertension    Hypothyroid    HYPOTHYROIDISM 04/16/2007   Kidney stone    Osteopenia 02/2018   T score -1.9 FRAX 10% / 1.4% stable from prior DEXA.     Past Surgical History:  Procedure Laterality Date   BREAST SURGERY     bx left breast- and removal of fibroadenoma   COLONOSCOPY     EXCISION OF MELANOMA  1978   HYSTEROSCOPY  1988   HYSTEROSCOPY,D&C   LAPAROSCOPIC APPENDECTOMY N/A 11/10/2015   Procedure: APPENDECTOMY LAPAROSCOPIC W PARTIAL CECECTOMY ;  Surgeon: Jackolyn Confer, MD;  Location: [facility];  Service: General;  Laterality: N/A;   LAPAROSCOPIC ILEOCECECTOMY N/A 11/10/2015   Procedure: LAPAROSCOPIC ILEOCECECTOMY;  Surgeon: Jackolyn Confer, MD;  Location: [facility];  Service: General;  Laterality: N/A;   MASS EXCISION Right 02/23/2014   Procedure: BIOPSY TEMPORAL ARTERY RIGHT;  Surgeon: Rozetta Nunnery, MD;  Location: [facility];  Service: ENT;  Laterality: Right;   POLYPECTOMY     TONSILLECTOMY     Family History   Problem Relation Age of Onset   Diabetes Mother    Heart disease Mother 29   Dementia Mother    Diabetes Father    Heart disease Father 48   Cancer Father        Skin cancer-led to death   Breast cancer Maternal Grandmother        Age 76   Colon cancer Other        first cousin dx'd in his 64's   Stomach cancer Neg Hx    Colon polyps Neg Hx    Esophageal cancer Neg Hx    Rectal cancer Neg Hx    Social History   Tobacco Use   Smoking status: Never   Smokeless tobacco: Never  Vaping Use   Vaping Use: Never used  Substance Use Topics   Alcohol use: Yes    Alcohol/week: 14.0 standard drinks of alcohol    Types: 14 Standard drinks or equivalent per week    Comment: wine daily 14 glasses per week   Drug use: No   Current Outpatient Medications  Medication Sig Dispense Refill   ALPRAZolam (XANAX) 0.5 MG tablet TAKE 1/2 TABLET TWICE DAILY AS NEEDED. 30 tablet 1   buPROPion (WELLBUTRIN XL) 150 MG 24 hr tablet Take 150 mg by mouth every morning.     ibandronate (BONIVA) 150 MG tablet daily.     levothyroxine (SYNTHROID,  LEVOTHROID) 88 MCG tablet TAKE 1 TABLET ONCE DAILY. 90 tablet 3   lisinopril (PRINIVIL,ZESTRIL) 40 MG tablet TAKE 1 TABLET EACH DAY. 90 tablet 0   rosuvastatin (CRESTOR) 20 MG tablet Take 1 tablet (20 mg total) by mouth daily. 90 tablet 3   zolpidem (AMBIEN) 5 MG tablet TAKE 1/2 TO 1 TABLET AT BEDTIME AS NEEDED. 30 tablet 5   calcium carbonate (OSCAL) 1500 (600 Ca) MG TABS tablet Take 1 tablet by mouth daily. (Patient not taking: Reported on 08/21/2022)     Cholecalciferol (VITAMIN D3) 10 MCG (400 UNIT) CAPS Take by mouth daily. (Patient not taking: Reported on 08/21/2022)     sertraline (ZOLOFT) 100 MG tablet Take 1.5 tablets (150 mg total) by mouth daily. (Patient not taking: Reported on 08/21/2022) 135 tablet 2   No current facility-administered medications for this visit.   Allergies  Allergen Reactions   Compazine Other (See Comments)    extrapyramidal  syndrome   Prednisone Other (See Comments)    Mood swings - crying      Review of Systems: All systems reviewed and negative except where noted in HPI.   Physical Exam: BP 116/62 (BP Location: Left Arm, Patient Position: Sitting, Cuff Size: Normal)   Pulse 67   Ht '5\' 2"'$  (1.575 m)   Wt 138 lb 6 oz (62.8 kg)   SpO2 95%   BMI 25.31 kg/m  Constitutional: Pleasant,well-developed, female in no acute distress. HEENT: Normocephalic and atraumatic. Conjunctivae are normal. No scleral icterus. Cardiovascular: Normal rate, regular rhythm.  Pulmonary/chest: Effort normal and breath sounds normal. No wheezing, rales or rhonchi. Abdominal: Soft, nondistended, nontender. Bowel sounds active throughout. There are no masses palpable. No hepatomegaly. Extremities: No edema Neurological: Alert and oriented to person place and time. Skin: Skin is warm and dry. No rashes noted. Psychiatric: Normal mood and affect. Behavior is normal.  Labs 08/2018: CMP unremarkable. CBC unremarkable.  CT A/P w/contrast 08/18/18: IMPRESSION: 1. No acute abnormality. 2. Colonic diverticulosis without evidence of diverticulitis. 3. Mild diffuse hepatic steatosis. 4. Stable small simple appearing right ovarian cyst.  Colonoscopy 01/14/06:  Path: Adenomatous polyps  Colonoscopy 07/09/12:  Path: Surgical [P], sigmoid, polyp - TUBULOVILLOUS ADENOMA. - HIGH GRADE DYSPLASIA IS NOT IDENTIFIED.  Colonoscopy 10/03/15: ENDOSCOPIC IMPRESSION: 1. Possible polyp surrounding and appearing to come from within the appendiceal orifice. Cold forceps used for biopsy (multiple) 2. Sessile polyp was found at the cecum; polypectomy was performed with a cold snare 3. Sessile polyp was found in the sigmoid colon; polypectomy was performed with a cold snare 4. Mild diverticulosis was noted in the left colon Path: 1. Surgical [P], appendiceal orifice biopsy - SESSILE SERRATED POLYP WITHOUT CYTOLOGIC DYSPLASIA, FRAGMENTED. -  ADDITIONAL FRAGMENTS OF BENIGN COLORECTAL MUCOSA WITH ASSOCIATED BENIGN LYMPHOID AGGREGATE(S), REMAINING TISSUE. 2. Surgical [P], appendiceal polyp and sigmoid polyp, (2) - TUBULAR ADENOMA (X1). - HYPERPLASTIC POLYP (X1). - BENIGN COLORECTAL MUCOSA WITH ASSOCIATED BENIGN LYMPHOID AGGREGATE(S), ONE FRAGMENT. - NO HIGH GRADE DYSPLASIA OR MALIGNANCY IDENTIFIED.  Colonoscopy 01/17/17: - The examined portion of the ileum was normal. - Evidence of surgical intervention in the cecum, characterized by healthy appearing mucosa without residual polyp. - Mild diverticulosis in the sigmoid colon and in the descending colon. - Internal hemorrhoids. - No specimens collected.  ASSESSMENT AND PLAN: History of colon polyps Patient presents to discuss a colonoscopy procedure for polyp surveillance. 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 Patient appears to be fairly healthy. I went over the  risks and benefits of the colonoscopy procedure with the patient, and she wishes to proceed with colonoscopy for polyp surveillance. - Colonoscopy [facility]  Christia Reading, MD

## 2022-08-21 NOTE — Patient Instructions (Signed)
_______________________________________________________  If you are age 77 or older, your body mass index should be between 23-30. Your Body mass index is 25.31 kg/m. If this is out of the aforementioned range listed, please consider follow up with your Primary Care Provider. ________________________________________________________  The [facility] providers would like to encourage you to use [facility] to communicate with providers for non-urgent requests or questions.  Due to long hold times on the telephone, sending your provider a message by [facility] may be a faster and more efficient way to get a response.  Please allow 48 business hours for a response.  Please remember that this is for non-urgent requests.  _______________________________________________________  Caroline Peters have been scheduled for a colonoscopy. Please follow written instructions given to you at your visit today.  Please pick up your prep supplies at the pharmacy within the next 1-3 days. If you use inhalers (even only as needed), please bring them with you on the day of your procedure.  Due to recent changes in healthcare laws, you may see the results of your imaging and laboratory studies on MyChart before your provider has had a chance to review them.  We understand that in some cases there may be results that are confusing or concerning to you. Not all laboratory results come back in the same time frame and the provider may be waiting for multiple results in order to interpret others.  Please give [place] 48 hours in order for your provider to thoroughly review all the results before contacting the office for clarification of your results.   Thank you for entrusting me with your care and choosing [facility].  Dr Lorenso Courier

## 2022-09-17 ENCOUNTER — Encounter: Payer: Medicare Other | Admitting: Internal Medicine

## 2022-09-26 DIAGNOSIS — L249 Irritant contact dermatitis, unspecified cause: Secondary | ICD-10-CM | POA: Diagnosis not present

## 2022-09-26 DIAGNOSIS — L578 Other skin changes due to chronic exposure to nonionizing radiation: Secondary | ICD-10-CM | POA: Diagnosis not present

## 2022-09-26 DIAGNOSIS — Z85820 Personal history of malignant melanoma of skin: Secondary | ICD-10-CM | POA: Diagnosis not present

## 2022-09-26 DIAGNOSIS — L820 Inflamed seborrheic keratosis: Secondary | ICD-10-CM | POA: Diagnosis not present

## 2022-10-15 DIAGNOSIS — L282 Other prurigo: Secondary | ICD-10-CM | POA: Diagnosis not present

## 2022-10-15 DIAGNOSIS — Z85820 Personal history of malignant melanoma of skin: Secondary | ICD-10-CM | POA: Diagnosis not present

## 2022-10-15 DIAGNOSIS — L245 Irritant contact dermatitis due to other chemical products: Secondary | ICD-10-CM | POA: Diagnosis not present

## 2022-10-22 DIAGNOSIS — K080 Exfoliation of teeth due to systemic causes: Secondary | ICD-10-CM | POA: Diagnosis not present

## 2022-11-01 DIAGNOSIS — K080 Exfoliation of teeth due to systemic causes: Secondary | ICD-10-CM | POA: Diagnosis not present

## 2022-12-18 DIAGNOSIS — E039 Hypothyroidism, unspecified: Secondary | ICD-10-CM | POA: Diagnosis not present

## 2022-12-25 DIAGNOSIS — K219 Gastro-esophageal reflux disease without esophagitis: Secondary | ICD-10-CM | POA: Diagnosis not present

## 2022-12-25 DIAGNOSIS — E785 Hyperlipidemia, unspecified: Secondary | ICD-10-CM | POA: Diagnosis not present

## 2022-12-25 DIAGNOSIS — I10 Essential (primary) hypertension: Secondary | ICD-10-CM | POA: Diagnosis not present

## 2022-12-25 DIAGNOSIS — E039 Hypothyroidism, unspecified: Secondary | ICD-10-CM | POA: Diagnosis not present

## 2022-12-25 DIAGNOSIS — R7989 Other specified abnormal findings of blood chemistry: Secondary | ICD-10-CM | POA: Diagnosis not present

## 2022-12-27 DIAGNOSIS — R82998 Other abnormal findings in urine: Secondary | ICD-10-CM | POA: Diagnosis not present

## 2022-12-27 DIAGNOSIS — I10 Essential (primary) hypertension: Secondary | ICD-10-CM | POA: Diagnosis not present

## 2023-01-02 DIAGNOSIS — F330 Major depressive disorder, recurrent, mild: Secondary | ICD-10-CM | POA: Diagnosis not present

## 2023-01-02 DIAGNOSIS — I129 Hypertensive chronic kidney disease with stage 1 through stage 4 chronic kidney disease, or unspecified chronic kidney disease: Secondary | ICD-10-CM | POA: Diagnosis not present

## 2023-01-02 DIAGNOSIS — Z0000 Encounter for general adult medical examination without abnormal findings: Secondary | ICD-10-CM | POA: Diagnosis not present

## 2023-01-02 DIAGNOSIS — I2510 Atherosclerotic heart disease of native coronary artery without angina pectoris: Secondary | ICD-10-CM | POA: Diagnosis not present

## 2023-01-02 DIAGNOSIS — N1831 Chronic kidney disease, stage 3a: Secondary | ICD-10-CM | POA: Diagnosis not present

## 2023-02-04 DIAGNOSIS — D225 Melanocytic nevi of trunk: Secondary | ICD-10-CM | POA: Diagnosis not present

## 2023-02-04 DIAGNOSIS — L298 Other pruritus: Secondary | ICD-10-CM | POA: Diagnosis not present

## 2023-02-04 DIAGNOSIS — D2271 Melanocytic nevi of right lower limb, including hip: Secondary | ICD-10-CM | POA: Diagnosis not present

## 2023-02-04 DIAGNOSIS — Z85820 Personal history of malignant melanoma of skin: Secondary | ICD-10-CM | POA: Diagnosis not present

## 2023-02-20 DIAGNOSIS — H524 Presbyopia: Secondary | ICD-10-CM | POA: Diagnosis not present

## 2023-03-12 DIAGNOSIS — K080 Exfoliation of teeth due to systemic causes: Secondary | ICD-10-CM | POA: Diagnosis not present

## 2023-03-18 DIAGNOSIS — K080 Exfoliation of teeth due to systemic causes: Secondary | ICD-10-CM | POA: Diagnosis not present

## 2023-04-02 DIAGNOSIS — Z1231 Encounter for screening mammogram for malignant neoplasm of breast: Secondary | ICD-10-CM | POA: Diagnosis not present

## 2023-05-10 DIAGNOSIS — H539 Unspecified visual disturbance: Secondary | ICD-10-CM | POA: Diagnosis not present

## 2023-05-13 DIAGNOSIS — H35371 Puckering of macula, right eye: Secondary | ICD-10-CM | POA: Diagnosis not present

## 2023-05-13 DIAGNOSIS — H353121 Nonexudative age-related macular degeneration, left eye, early dry stage: Secondary | ICD-10-CM | POA: Diagnosis not present

## 2023-05-13 DIAGNOSIS — H2513 Age-related nuclear cataract, bilateral: Secondary | ICD-10-CM | POA: Diagnosis not present

## 2023-05-22 DIAGNOSIS — N1831 Chronic kidney disease, stage 3a: Secondary | ICD-10-CM | POA: Diagnosis not present

## 2023-05-22 DIAGNOSIS — R051 Acute cough: Secondary | ICD-10-CM | POA: Diagnosis not present

## 2023-05-30 DIAGNOSIS — N1831 Chronic kidney disease, stage 3a: Secondary | ICD-10-CM | POA: Diagnosis not present

## 2023-07-01 DIAGNOSIS — F419 Anxiety disorder, unspecified: Secondary | ICD-10-CM | POA: Diagnosis not present

## 2023-07-25 DIAGNOSIS — K080 Exfoliation of teeth due to systemic causes: Secondary | ICD-10-CM | POA: Diagnosis not present

## 2023-07-29 DIAGNOSIS — Z85820 Personal history of malignant melanoma of skin: Secondary | ICD-10-CM | POA: Diagnosis not present

## 2023-07-29 DIAGNOSIS — L820 Inflamed seborrheic keratosis: Secondary | ICD-10-CM | POA: Diagnosis not present

## 2023-08-14 DIAGNOSIS — E039 Hypothyroidism, unspecified: Secondary | ICD-10-CM | POA: Diagnosis not present

## 2023-08-14 DIAGNOSIS — I129 Hypertensive chronic kidney disease with stage 1 through stage 4 chronic kidney disease, or unspecified chronic kidney disease: Secondary | ICD-10-CM | POA: Diagnosis not present

## 2023-10-22 DIAGNOSIS — N644 Mastodynia: Secondary | ICD-10-CM | POA: Diagnosis not present

## 2023-11-21 DIAGNOSIS — H6121 Impacted cerumen, right ear: Secondary | ICD-10-CM | POA: Diagnosis not present

## 2024-02-03 DIAGNOSIS — Z85820 Personal history of malignant melanoma of skin: Secondary | ICD-10-CM | POA: Diagnosis not present

## 2024-02-03 DIAGNOSIS — L245 Irritant contact dermatitis due to other chemical products: Secondary | ICD-10-CM | POA: Diagnosis not present

## 2024-02-04 DIAGNOSIS — K080 Exfoliation of teeth due to systemic causes: Secondary | ICD-10-CM | POA: Diagnosis not present

## 2024-02-21 DIAGNOSIS — H52203 Unspecified astigmatism, bilateral: Secondary | ICD-10-CM | POA: Diagnosis not present

## 2024-04-02 DIAGNOSIS — E039 Hypothyroidism, unspecified: Secondary | ICD-10-CM | POA: Diagnosis not present

## 2024-04-02 DIAGNOSIS — M8589 Other specified disorders of bone density and structure, multiple sites: Secondary | ICD-10-CM | POA: Diagnosis not present

## 2024-04-02 DIAGNOSIS — Z1212 Encounter for screening for malignant neoplasm of rectum: Secondary | ICD-10-CM | POA: Diagnosis not present

## 2024-04-02 DIAGNOSIS — E785 Hyperlipidemia, unspecified: Secondary | ICD-10-CM | POA: Diagnosis not present

## 2024-04-02 DIAGNOSIS — R7301 Impaired fasting glucose: Secondary | ICD-10-CM | POA: Diagnosis not present

## 2024-04-07 DIAGNOSIS — Z1231 Encounter for screening mammogram for malignant neoplasm of breast: Secondary | ICD-10-CM | POA: Diagnosis not present

## 2024-04-10 DIAGNOSIS — Z23 Encounter for immunization: Secondary | ICD-10-CM | POA: Diagnosis not present

## 2024-04-10 DIAGNOSIS — I129 Hypertensive chronic kidney disease with stage 1 through stage 4 chronic kidney disease, or unspecified chronic kidney disease: Secondary | ICD-10-CM | POA: Diagnosis not present

## 2024-04-10 DIAGNOSIS — Z1331 Encounter for screening for depression: Secondary | ICD-10-CM | POA: Diagnosis not present

## 2024-04-10 DIAGNOSIS — I10 Essential (primary) hypertension: Secondary | ICD-10-CM | POA: Diagnosis not present

## 2024-04-10 DIAGNOSIS — Z0000 Encounter for general adult medical examination without abnormal findings: Secondary | ICD-10-CM | POA: Diagnosis not present

## 2024-04-10 DIAGNOSIS — Z1339 Encounter for screening examination for other mental health and behavioral disorders: Secondary | ICD-10-CM | POA: Diagnosis not present

## 2024-04-10 DIAGNOSIS — R82998 Other abnormal findings in urine: Secondary | ICD-10-CM | POA: Diagnosis not present

## 2024-05-05 DIAGNOSIS — L821 Other seborrheic keratosis: Secondary | ICD-10-CM | POA: Diagnosis not present

## 2024-05-05 DIAGNOSIS — Z85820 Personal history of malignant melanoma of skin: Secondary | ICD-10-CM | POA: Diagnosis not present

## 2024-05-05 DIAGNOSIS — D2271 Melanocytic nevi of right lower limb, including hip: Secondary | ICD-10-CM | POA: Diagnosis not present

## 2024-05-05 DIAGNOSIS — D225 Melanocytic nevi of trunk: Secondary | ICD-10-CM | POA: Diagnosis not present

## 2024-05-14 DIAGNOSIS — R413 Other amnesia: Secondary | ICD-10-CM | POA: Diagnosis not present
